# Patient Record
Sex: Male | Born: 1965
Health system: Southern US, Community
[De-identification: ages and names within clinical notes are randomized; demographics above are authoritative.]

## PROBLEM LIST (undated history)

## (undated) DIAGNOSIS — G35 Multiple sclerosis: Secondary | ICD-10-CM

## (undated) DIAGNOSIS — I10 Essential (primary) hypertension: Secondary | ICD-10-CM

## (undated) DIAGNOSIS — F419 Anxiety disorder, unspecified: Secondary | ICD-10-CM

## (undated) DIAGNOSIS — IMO0002 Reserved for concepts with insufficient information to code with codable children: Secondary | ICD-10-CM

## (undated) DIAGNOSIS — R51 Headache: Secondary | ICD-10-CM

## (undated) DIAGNOSIS — D131 Benign neoplasm of stomach: Secondary | ICD-10-CM

## (undated) DIAGNOSIS — E785 Hyperlipidemia, unspecified: Secondary | ICD-10-CM

## (undated) DIAGNOSIS — G709 Myoneural disorder, unspecified: Secondary | ICD-10-CM

## (undated) HISTORY — PX: RHINOPLASTY: SUR1284

## (undated) HISTORY — DX: Essential (primary) hypertension: I10

## (undated) HISTORY — DX: Multiple sclerosis: G35

## (undated) HISTORY — DX: Reserved for concepts with insufficient information to code with codable children: IMO0002

## (undated) HISTORY — DX: Anxiety disorder, unspecified: F41.9

## (undated) HISTORY — DX: Hyperlipidemia, unspecified: E78.5

## (undated) HISTORY — PX: COLONOSCOPY: SHX174

## (undated) HISTORY — DX: Benign neoplasm of stomach: D13.1

## (undated) HISTORY — DX: Headache: R51

## (undated) HISTORY — DX: Myoneural disorder, unspecified: G70.9

---

## 2005-06-14 ENCOUNTER — Emergency Department (HOSPITAL_COMMUNITY): Admission: EM | Admit: 2005-06-14 | Discharge: 2005-06-14 | Payer: Self-pay | Admitting: Emergency Medicine

## 2006-05-29 ENCOUNTER — Emergency Department (HOSPITAL_COMMUNITY): Admission: EM | Admit: 2006-05-29 | Discharge: 2006-05-29 | Payer: Self-pay | Admitting: Emergency Medicine

## 2007-01-10 ENCOUNTER — Emergency Department (HOSPITAL_COMMUNITY): Admission: EM | Admit: 2007-01-10 | Discharge: 2007-01-10 | Payer: Self-pay | Admitting: *Deleted

## 2009-03-28 ENCOUNTER — Ambulatory Visit: Payer: Self-pay | Admitting: Internal Medicine

## 2009-03-28 DIAGNOSIS — R519 Headache, unspecified: Secondary | ICD-10-CM | POA: Insufficient documentation

## 2009-03-28 DIAGNOSIS — G35 Multiple sclerosis: Secondary | ICD-10-CM | POA: Insufficient documentation

## 2009-03-28 DIAGNOSIS — K270 Acute peptic ulcer, site unspecified, with hemorrhage: Secondary | ICD-10-CM | POA: Insufficient documentation

## 2009-03-28 DIAGNOSIS — R51 Headache: Secondary | ICD-10-CM | POA: Insufficient documentation

## 2009-03-28 LAB — CONVERTED CEMR LAB
Basophils Absolute: 0 10*3/uL (ref 0.0–0.1)
Basophils Relative: 0.3 % (ref 0.0–3.0)
Eosinophils Absolute: 0.2 10*3/uL (ref 0.0–0.7)
Eosinophils Relative: 2.4 % (ref 0.0–5.0)
H Pylori IgG: NEGATIVE
HCT: 50.1 % (ref 39.0–52.0)
Hemoglobin: 16.3 g/dL (ref 13.0–17.0)
Iron: 61 ug/dL (ref 42–165)
Lymphocytes Relative: 38.1 % (ref 12.0–46.0)
Lymphs Abs: 2.5 10*3/uL (ref 0.7–4.0)
MCHC: 32.5 g/dL (ref 30.0–36.0)
MCV: 95.9 fL (ref 78.0–100.0)
Monocytes Absolute: 0.5 10*3/uL (ref 0.1–1.0)
Monocytes Relative: 7.9 % (ref 3.0–12.0)
Neutro Abs: 3.4 10*3/uL (ref 1.4–7.7)
Neutrophils Relative %: 51.3 % (ref 43.0–77.0)
Platelets: 134 10*3/uL — ABNORMAL LOW (ref 150.0–400.0)
RBC: 5.23 M/uL (ref 4.22–5.81)
RDW: 11.4 % — ABNORMAL LOW (ref 11.5–14.6)
Saturation Ratios: 20 % (ref 20.0–50.0)
Transferrin: 217.7 mg/dL (ref 212.0–360.0)
WBC: 6.6 10*3/uL (ref 4.5–10.5)

## 2009-04-03 ENCOUNTER — Telehealth: Payer: Self-pay | Admitting: *Deleted

## 2009-04-30 ENCOUNTER — Ambulatory Visit: Payer: Self-pay | Admitting: Internal Medicine

## 2009-04-30 DIAGNOSIS — J069 Acute upper respiratory infection, unspecified: Secondary | ICD-10-CM | POA: Insufficient documentation

## 2009-05-15 ENCOUNTER — Ambulatory Visit: Payer: Self-pay | Admitting: Internal Medicine

## 2009-05-27 ENCOUNTER — Encounter: Payer: Self-pay | Admitting: Internal Medicine

## 2009-07-16 ENCOUNTER — Ambulatory Visit: Payer: Self-pay | Admitting: Family Medicine

## 2009-07-16 DIAGNOSIS — M62838 Other muscle spasm: Secondary | ICD-10-CM | POA: Insufficient documentation

## 2009-07-16 DIAGNOSIS — M5430 Sciatica, unspecified side: Secondary | ICD-10-CM | POA: Insufficient documentation

## 2009-07-16 DIAGNOSIS — M5416 Radiculopathy, lumbar region: Secondary | ICD-10-CM | POA: Insufficient documentation

## 2009-07-23 ENCOUNTER — Telehealth: Payer: Self-pay | Admitting: Internal Medicine

## 2009-07-26 ENCOUNTER — Ambulatory Visit: Payer: Self-pay | Admitting: Internal Medicine

## 2009-08-01 ENCOUNTER — Encounter: Admission: RE | Admit: 2009-08-01 | Discharge: 2009-08-01 | Payer: Self-pay | Admitting: Internal Medicine

## 2009-09-02 ENCOUNTER — Ambulatory Visit: Payer: Self-pay | Admitting: Internal Medicine

## 2009-09-02 LAB — CONVERTED CEMR LAB
ALT: 18 units/L (ref 0–53)
AST: 26 units/L (ref 0–37)
Albumin: 4.3 g/dL (ref 3.5–5.2)
Alkaline Phosphatase: 68 units/L (ref 39–117)
BUN: 18 mg/dL (ref 6–23)
Basophils Absolute: 0 10*3/uL (ref 0.0–0.1)
Basophils Relative: 0.9 % (ref 0.0–3.0)
Bilirubin Urine: NEGATIVE
Bilirubin, Direct: 0.1 mg/dL (ref 0.0–0.3)
Blood in Urine, dipstick: NEGATIVE
CO2: 29 meq/L (ref 19–32)
Calcium: 9.2 mg/dL (ref 8.4–10.5)
Chloride: 108 meq/L (ref 96–112)
Cholesterol: 241 mg/dL — ABNORMAL HIGH (ref 0–200)
Creatinine, Ser: 0.9 mg/dL (ref 0.4–1.5)
Direct LDL: 164.7 mg/dL
Eosinophils Absolute: 0.2 10*3/uL (ref 0.0–0.7)
Eosinophils Relative: 4.6 % (ref 0.0–5.0)
GFR calc non Af Amer: 93.3 mL/min (ref >60)
Glucose, Bld: 86 mg/dL (ref 70–99)
Glucose, Urine, Semiquant: NEGATIVE
HCT: 45.8 % (ref 39.0–52.0)
HDL: 62.8 mg/dL (ref >39.00)
Hemoglobin: 15.8 g/dL (ref 13.0–17.0)
Ketones, urine, test strip: NEGATIVE
Lymphocytes Relative: 38 % (ref 12.0–46.0)
Lymphs Abs: 1.9 10*3/uL (ref 0.7–4.0)
MCHC: 34.6 g/dL (ref 30.0–36.0)
MCV: 93.8 fL (ref 78.0–100.0)
Monocytes Absolute: 0.5 10*3/uL (ref 0.1–1.0)
Monocytes Relative: 8.9 % (ref 3.0–12.0)
Neutro Abs: 2.4 10*3/uL (ref 1.4–7.7)
Neutrophils Relative %: 47.6 % (ref 43.0–77.0)
Nitrite: NEGATIVE
Platelets: 135 10*3/uL — ABNORMAL LOW (ref 150.0–400.0)
Potassium: 5.2 meq/L — ABNORMAL HIGH (ref 3.5–5.1)
Protein, U semiquant: NEGATIVE
RBC: 4.88 M/uL (ref 4.22–5.81)
RDW: 12.9 % (ref 11.5–14.6)
Sodium: 143 meq/L (ref 135–145)
Specific Gravity, Urine: 1.025
TSH: 1.43 microintl units/mL (ref 0.35–5.50)
Total Bilirubin: 0.9 mg/dL (ref 0.3–1.2)
Total CHOL/HDL Ratio: 4
Total Protein: 6.3 g/dL (ref 6.0–8.3)
Triglycerides: 66 mg/dL (ref 0.0–149.0)
Urobilinogen, UA: 0.2
VLDL: 13.2 mg/dL (ref 0.0–40.0)
WBC Urine, dipstick: NEGATIVE
WBC: 5.1 10*3/uL (ref 4.5–10.5)
pH: 5.5

## 2010-04-22 NOTE — Medication Information (Signed)
Summary: Coverage Approval for Dexilant  Coverage Approval for Dexilant   Imported By: Maryln Gottron 05/30/2009 11:27:05  _____________________________________________________________________  External Attachment:    Type:   Image     Comment:   External Document

## 2010-04-22 NOTE — Assessment & Plan Note (Signed)
Summary: chest congestion/cough/runny nose/cjr   Vital Signs:  Patient profile:   45 year old male Weight:      189 pounds Temp:     98.9 degrees F oral BP sitting:   120 / 72  (right arm) Cuff size:   regular  Vitals Entered By: Raechel Ache, RN (April 30, 2009 3:32 PM) CC: c/o nose congestion, cough , OTC not improving   CC:  c/o nose congestion, cough , and OTC not improving.  History of Present Illness: 45 year old patient with a one-week history of mild cough and sinus congestion.  there's been no fever, but he has noticed some increasing thickness and discoloration to the sputum production and sinus drainage.  Denies any shortness or breath or chest congestion.  He has been using the number of OTC medications, including expectant stick ingestants and antihistamines. he denies any local sinus pain or dental pain. He has been on PPI therapy for suspected exacerbation of peptic ulcer disease.  His symptoms have largely resolved  Allergies: No Known Drug Allergies  Past History:  Past Medical History: Reviewed history from 03/28/2009 and no changes required. Headache hx of ulcers MS  diagnosed 10 years ago ( [facility]- neurology) ( MRI and protein in spinal fluid and acute paralysis) ( MS crisis) 2000  Review of Systems       The patient complains of prolonged cough.  The patient denies anorexia, fever, weight loss, weight gain, vision loss, decreased hearing, hoarseness, chest pain, syncope, dyspnea on exertion, peripheral edema, headaches, hemoptysis, abdominal pain, melena, hematochezia, severe indigestion/heartburn, hematuria, incontinence, genital sores, muscle weakness, suspicious skin lesions, transient blindness, difficulty walking, depression, unusual weight change, abnormal bleeding, enlarged lymph nodes, angioedema, breast masses, and testicular masses.    Physical Exam  General:  Well-developed,well-nourished,in no acute distress; alert,appropriate and cooperative  throughout examination Head:  Normocephalic and atraumatic without obvious abnormalities. No apparent alopecia or balding. Eyes:  No corneal or conjunctival inflammation noted. EOMI. Perrla. Funduscopic exam benign, without hemorrhages, exudates or papilledema. Vision grossly normal. Ears:  External ear exam shows no significant lesions or deformities.  Otoscopic examination reveals clear canals, tympanic membranes are intact bilaterally without bulging, retraction, inflammation or discharge. Hearing is grossly normal bilaterally. Mouth:  Oral mucosa and oropharynx without lesions or exudates.  Teeth in good repair. Neck:  No deformities, masses, or tenderness noted. Lungs:  Normal respiratory effort, chest expands symmetrically. Lungs are clear to auscultation, no crackles or wheezes. Heart:  Normal rate and regular rhythm. S1 and S2 normal without gallop, murmur, click, rub or other extra sounds. Abdomen:  Bowel sounds positive,abdomen soft and non-tender without masses, organomegaly or hernias noted.   Impression & Recommendations:  Problem # 1:  URI (ICD-465.9)  Problem # 2:  ACUT PEPTC ULCR UNS SITE W/HEM W/O MENTION OBST (ICD-533.00)  His updated medication list for this problem includes:    Dexilant 60 Mg Cpdr (Dexlansoprazole) ..... One by mouth two times a day  His updated medication list for this problem includes:    Dexilant 60 Mg Cpdr (Dexlansoprazole) ..... One by mouth two times a day  Complete Medication List: 1)  Copaxone 20 Mg/ml Kit (Glatiramer acetate) .[name].. 1-2 times a week 2)  Dexilant 60 Mg Cpdr (Dexlansoprazole) .... One by mouth two times a day  Patient Instructions: 1)  Get plenty of rest, drink lots of clear liquids, and use Tylenol or Ibuprofen for fever and comfort. Return in 7-10 days if you're not better:sooner if you're  feeling worse.

## 2010-04-22 NOTE — Assessment & Plan Note (Signed)
Summary: TO BE EST/OK PER BONNIE/NJR   Vital Signs:  Patient profile:   45 year old male Height:      70 inches Weight:      186 pounds BMI:     26.78 Temp:     98.2 degrees F oral Pulse rate:   72 / minute Resp:     14 per minute BP sitting:   110 / 80  (left arm)  Vitals Entered By: Willy Eddy, LPN (March 28, 2009 2:26 PM) CC: new pt to establish- c/o abd cramping with irregaular bowel movements- hx of ulcer in the past and he states it feels the same, Abdominal Pain   CC:  new pt to establish- c/o abd cramping with irregaular bowel movements- hx of ulcer in the past and he states it feels the same and Abdominal Pain.  History of Present Illness: increased with eating certain food and post pradial bloating dark stools variable consistancy hx of "ulcer" but no egd hx of MS no excessive sas use potential increased wine and greasy foods increasd stress and anxiety I have spent greater that 45 min face to face evaluating this patient and over 1/2 of this time was in councilling   Dyspepsia History:      The patient has positive alarm features of dyspepsia which include melena.  There is no prior history of GERD.  The patient does not have a prior history of documented ulcer disease.  The dominant symptom is heartburn or acid reflux.  An H-2 blocker medication is currently being taken.  He notes that the symptoms have not improved with the H-2 blocker therapy.  No previous upper endoscopy has been done.    Preventive Screening-Counseling & Management  Alcohol-Tobacco     Smoking Status: quit  Caffeine-Diet-Exercise     Caffeine use/day: yes 2-3 cups     Does Patient Exercise: yes      Drug Use:  no.    Problems Prior to Update: 1)  Family History of Alcoholism/addiction  (ICD-V61.41) 2)  Multiple Sclerosis  (ICD-340) 3)  Headache  (ICD-784.0)  Medications Prior to Update: 1)  None  Current Medications (verified): 1)  Copaxone 20 Mg/ml Kit (Glatiramer  Acetate) .[name].. 1-2 Times A Week  Allergies (verified): No Known Drug Allergies  Past History:  Family History: Last updated: 03/28/2009 Family History of Alcoholism/Addiction Family History of Arthritis  Social History: Last updated: 03/28/2009 Occupation:investment banker Married Alcohol use-yes Drug use-no Regular exercise-yes Former Smoker quit 1.2 years  Risk Factors: Caffeine Use: yes 2-3 cups (03/28/2009) Exercise: yes (03/28/2009)  Risk Factors: Smoking Status: quit (03/28/2009)  Past medical, surgical, family and social histories (including risk factors) reviewed, and no changes noted (except as noted below).  Past Medical History: Headache hx of ulcers MS  diagnosed 10 years ago ( [facility]- [facility]y) ( MRI and protein in spinal fluid and acute paralysis) ( MS crisis) 2000  Past Surgical History: rhinoplasty of noise when younger  Family History: Reviewed history and no changes required. Family History of Alcoholism/Addiction Family History of Arthritis  Social History: Reviewed history and no changes required. Occupation:investment banker Married Alcohol use-yes Drug use-no Regular exercise-yes Former Smoker quit 1.2 years Smoking Status:  quit Drug Use:  no Does Patient Exercise:  yes Caffeine use/day:  yes 2-3 cups  Review of Systems       The patient complains of abdominal pain and melena.  The patient denies anorexia, fever, weight loss, weight gain, vision loss, decreased  hearing, hoarseness, chest pain, syncope, dyspnea on exertion, peripheral edema, prolonged cough, headaches, hemoptysis, hematochezia, severe indigestion/heartburn, hematuria, incontinence, genital sores, muscle weakness, suspicious skin lesions, transient blindness, difficulty walking, depression, unusual weight change, abnormal bleeding, enlarged lymph nodes, angioedema, breast masses, and testicular masses.    Physical Exam  General:  Well-developed,well-nourished,in no  acute distress; alert,appropriate and cooperative throughout examination Head:  normocephalic, atraumatic, and male-pattern balding.   Eyes:  pupils equal and pupils round.  red conjuctava Ears:  External ear exam shows no significant lesions or deformities.  Otoscopic examination reveals clear canals, tympanic membranes are intact bilaterally without bulging, retraction, inflammation or discharge. Hearing is grossly normal bilaterally. Nose:  External nasal examination shows no deformity or inflammation. Nasal mucosa are pink and moist without lesions or exudates. Mouth:  good dentition and pharynx pink and moist.   Neck:  No deformities, masses, or tenderness noted. Lungs:  Normal respiratory effort, chest expands symmetrically. Lungs are clear to auscultation, no crackles or wheezes. Heart:  normal rate and regular rhythm.   Abdomen:  soft, no guarding, no rigidity, no rebound tenderness, and epigastric tenderness.   Rectal:  no external abnormalities and no hemorrhoids.   stool heme Prostate:  no gland enlargement, no nodules, and no asymmetry.     Impression & Recommendations:  Problem # 1:  ACUT PEPTC ULCR UNS SITE W/HEM W/O MENTION OBST (ICD-533.00)  no worrisome signs for continues bleeding and stool is now heme negative check labs  His updated medication list for this problem includes:    Dexilant 60 Mg Cpdr (Dexlansoprazole) ..... One by mouth two times a day  Orders: Venipuncture (16109) TLB-CBC Platelet - w/Differential (85025-CBCD) TLB-IBC Pnl (Iron/FE;Transferrin) (83550-IBC) TLB-H. Pylori Abs(Helicobacter Pylori) (86677-HELICO)  Problem # 2:  MULTIPLE SCLEROSIS (ICD-340) stable on meds reveiwed diagnosis and hx with pt  Complete Medication List: 1)  Copaxone 20 Mg/ml Kit (Glatiramer acetate) .[name].. 1-2 times a week 2)  Dexilant 60 Mg Cpdr (Dexlansoprazole) .... One by mouth two times a day  Patient Instructions: 1)  Please schedule a follow-up appointment in 4-5  weeks.  Contraindications/Deferment of Procedures/Staging:    Test/Procedure: FLU VAX    Reason for deferment: patient declined

## 2010-04-22 NOTE — Progress Notes (Signed)
Summary: back pain not getting better as before  Phone Note Call from Patient Call back at Home Phone [phone]   Reason for Call: Insurance Question Summary of Call: Dr. Scotty Court ov last week for acute low back pain and sometimes pain into both legs.  Gave steroids, gen flexeril, motrin.  Had been taking all three times a day and yesterday due to cut steroid to two times a day and also cut flexeril & motrin to two times a day.  By late pm acute low back pain again & felt like bones grinding together.  Couldn't walk on his own.  Wakes fine & walking gingerly, then at work sitting long day at desk becomes awful.  Ice & heat alternately.  Really not responding this time as well as before.  RA [employer].  NKDA.   Initial call taken by: Rudy Jew, RN,  Jul 23, 2009 8:13 AM  Follow-up for Phone Call        pt offered ov at 4:15 but pt declined Follow-up by: Willy Eddy, LPN,  Jul 23, 9560 10:37 AM  Additional Follow-up for Phone Call Additional follow up Details #1::        ov given for friday at 9;15 Additional Follow-up by: Willy Eddy, LPN,  Jul 24, 1306 12:28 PM

## 2010-04-22 NOTE — Assessment & Plan Note (Signed)
Summary: low back pain/dm   Vital Signs:  Patient profile:   45 year old male Weight:      190 pounds O2 Sat:      98 % Temp:     98.2 degrees F Pulse rate:   80 / minute BP sitting:   118 / 76  (left arm)  Vitals Entered By: Pura Spice, RN (July 16, 2009 3:18 PM) CC: low baack pain with pain radiating down left leg to the knee area.    History of Present Illness: This 45 year old white male with known multiple sclerosis under control. This complaint today is that of low back pain with radiation from the back to the left knee he has had back pain in the past with muscle spasm but not as severe as today. He had onset over the last few days and his symptoms and pain heart getting more severe. No recent history of injury or heavy lift. Alternative GI symptoms have been under control as well as his old sclerosis  Allergies (verified): No Known Drug Allergies  Past History:  Past Medical History: Last updated: 03/28/2009 Headache hx of ulcers MS  diagnosed 10 years ago ( [facility]- [facility]y) ( MRI and protein in spinal fluid and acute paralysis) ( MS crisis) 2000  Past Surgical History: Last updated: 03/28/2009 rhinoplasty of noise when younger  Social History: Last updated: 03/28/2009 Occupation:investment banker Married Alcohol use-yes Drug use-no Regular exercise-yes Former Smoker quit 1.2 years  Risk Factors: Smoking Status: quit (05/15/2009)  Review of Systems      See HPI  The patient denies anorexia, fever, weight loss, weight gain, vision loss, decreased hearing, hoarseness, chest pain, syncope, dyspnea on exertion, peripheral edema, prolonged cough, headaches, hemoptysis, abdominal pain, melena, hematochezia, severe indigestion/heartburn, hematuria, incontinence, genital sores, muscle weakness, suspicious skin lesions, transient blindness, difficulty walking, depression, unusual weight change, abnormal bleeding, enlarged lymph nodes, angioedema, breast masses,  and testicular masses.    Physical Exam  General:  Well-developed,well-nourished,in no acute distress; alert,appropriate and cooperative throughout examination Lungs:  Normal respiratory effort, chest expands symmetrically. Lungs are clear to auscultation, no crackles or wheezes. Heart:  Normal rate and regular rhythm. S1 and S2 normal without gallop, murmur, click, rub or other extra sounds. Abdomen:  Bowel sounds positive,abdomen soft and non-tender without masses, organomegaly or hernias noted. Msk:  muscle spasm lumbar spine as well as tenderness over the left sacroiliac joint No limitation straight leg raise and reflexes are 2+ bilaterally knee and ankle Extremities:  No clubbing, cyanosis, edema, or deformity noted with normal full range of motion of all joints.   Neurologic:  No cranial nerve deficits noted. Station and gait are normal. Plantar reflexes are down-going bilaterally. DTRs are symmetrical throughout. Sensory, motor and coordinative functions appear intact. Skin:  Intact without suspicious lesions or rashes Psych:  Cognition and judgment appear intact. Alert and cooperative with normal attention span and concentration. No apparent delusions, illusions, hallucinations   Impression & Recommendations:  Problem # 1:  MUSCLE SPASM, LUMBOSACRAL REGION (ICD-728.85) Assessment New Flexeril 10 mg t.i.d. Prednisone 20 mg decreased dose  Problem # 2:  SCIATICA (ICD-724.3) Assessment: New  His updated medication list for this problem includes:    Ibuprofen 800 Mg Tabs (Ibuprofen) .[name]... 1 three times a day as needed pain    Flexeril 10 Mg Tabs (Cyclobenzaprine hcl) .[name]... 1 stat then 1 morn, midaternoon and hs for muscle spasm  Problem # 3:  LUMBOSACRAL STRAIN, WITH RADICULOPATHY (ICD-846.0) Assessment: New  Complete Medication List: 1)  Copaxone 20 Mg/ml Kit (Glatiramer acetate) .[name].. 1-2 times a week 2)  Dexilant 60 Mg Cpdr (Dexlansoprazole) .... One by mouth daily 3)   Ibuprofen 800 Mg Tabs (Ibuprofen) .[name].. 1 three times a day as needed pain 4)  Prednisone 20 Mg Tabs (Prednisone) .[name].. 1 three times a day pc for 3 days then 1 two times a day pc for 6 days then 1 qd 5)  Flexeril 10 Mg Tabs (Cyclobenzaprine hcl) .[name].. 1 stat then 1 morn, midaternoon and hs for muscle spasm  Patient Instructions: 1)  Diagnosos of Lumbosacral stain, involving Sciatic joint and nerve with sciatica  2)  Tx Prednisone decreasing  dosage, antiinflammatory 3)  Flexeril gemneric 1  tab now then 1 morn, midafternoon and  4)  bedtime 4 muscle spasm 5)  TAKE  MOTRIN  800 MG  AS NEEDED FOR PAIN, EVERY 4-6 HOURS Prescriptions: FLEXERIL 10 MG TABS (CYCLOBENZAPRINE HCL) 1 stat then 1 morn, midaternoon and hs for muscle spasm  #60 x 2   Entered and Authorized by:   Judithann Sheen MD   Signed by:   Judithann Sheen MD on 07/16/2009   Method used:   Electronically to        [facility]. [phone]* (retail)       [address]       [place], [place]  [address]       Ph: [phone]       Fax: [phone]   RxID:   [id number] PREDNISONE 20 MG TABS (PREDNISONE) 1 three times a day pc for 3 days then 1 two times a day pc for 6 days then 1 qd  #30 x 1   Entered and Authorized by:   Judithann Sheen MD   Signed by:   Judithann Sheen MD on 07/16/2009   Method used:   Electronically to        [facility]. [phone]* (retail)       [address]       [place], [place]  [address]       Ph: [phone]       Fax: [phone]   RxID:   [id number] IBUPROFEN 800 MG TABS (IBUPROFEN) 1 three times a day as needed pain  #90 x 11   Entered and Authorized by:   Judithann Sheen MD   Signed by:   Judithann Sheen MD on 07/16/2009   Method used:   Electronically to        [facility]. [phone]* (retail)       [address]       [place], [place]  [address]       Ph:  [phone]       Fax: [phone]   RxID:   [id number]

## 2010-04-22 NOTE — Assessment & Plan Note (Signed)
Summary: FOLLOW UP //SLM   Vital Signs:  Patient profile:   45 year old male Height:      70 inches Weight:      188 pounds BMI:     27.07 Temp:     98.3 degrees F oral Pulse rate:   72 / minute Resp:     14 per minute BP sitting:   110 / 70  (left arm)  Vitals Entered By: Willy Eddy, LPN (May 15, 2009 10:22 AM)  Nutrition Counseling: Patient's BMI is greater than 25 and therefore counseled on weight management options. CC: roa from  abd pain-muich improved since taking dexilent-was given samples of prilosec by dr Kirtland Bouchard that hasnt worked as well, Abdominal Pain   CC:  roa from  abd pain-muich improved since taking dexilent-was given samples of prilosec by dr Kirtland Bouchard that hasnt worked as well and Abdominal Pain.  History of Present Illness: follow up presumptive diagnosis of PUD  Dyspepsia History:      The patient has positive alarm features of dyspepsia which include melena.  There is no prior history of GERD.  The patient has a prior history of documented ulcer disease.  The dominant symptom is heartburn or acid reflux.  An H-2 blocker medication is not currently being taken.  He has a history of a positive H. Pylori serology.  No previous upper endoscopy has been done.    Preventive Screening-Counseling & Management  Alcohol-Tobacco     Smoking Status: quit  Problems Prior to Update: 1)  Uri  (ICD-465.9) 2)  Acut Peptc Ulcr Uns Site W/hem w/o Mention Obst  (ICD-533.00) 3)  Family History of Alcoholism/addiction  (ICD-V61.41) 4)  Multiple Sclerosis  (ICD-340) 5)  Headache  (ICD-784.0)  Medications Prior to Update: 1)  Copaxone 20 Mg/ml Kit (Glatiramer Acetate) .[name].. 1-2 Times A Week 2)  Dexilant 60 Mg Cpdr (Dexlansoprazole) .... One By Mouth Two Times A Day  Current Medications (verified): 1)  Copaxone 20 Mg/ml Kit (Glatiramer Acetate) .[name].. 1-2 Times A Week 2)  Dexilant 60 Mg Cpdr (Dexlansoprazole) .... One By Mouth Daily  Allergies (verified): No Known Drug  Allergies  Past History:  Family History: Last updated: 03/28/2009 Family History of Alcoholism/Addiction Family History of Arthritis  Social History: Last updated: 03/28/2009 Occupation:investment banker Married Alcohol use-yes Drug use-no Regular exercise-yes Former Smoker quit 1.2 years  Risk Factors: Caffeine Use: yes 2-3 cups (03/28/2009) Exercise: yes (03/28/2009)  Risk Factors: Smoking Status: quit (05/15/2009)  Past medical, surgical, family and social histories (including risk factors) reviewed, and no changes noted (except as noted below).  Past Medical History: Reviewed history from 03/28/2009 and no changes required. Headache hx of ulcers MS  diagnosed 10 years ago ( [facility]- [facility]y) ( MRI and protein in spinal fluid and acute paralysis) ( MS crisis) 2000  Past Surgical History: Reviewed history from 03/28/2009 and no changes required. rhinoplasty of noise when younger  Family History: Reviewed history from 03/28/2009 and no changes required. Family History of Alcoholism/Addiction Family History of Arthritis  Social History: Reviewed history from 03/28/2009 and no changes required. Occupation:investment banker Married Alcohol use-yes Drug use-no Regular exercise-yes Former Smoker quit 1.2 years  Review of Systems  The patient denies anorexia, fever, weight loss, weight gain, vision loss, decreased hearing, hoarseness, chest pain, syncope, dyspnea on exertion, peripheral edema, prolonged cough, headaches, hemoptysis, abdominal pain, melena, hematochezia, severe indigestion/heartburn, hematuria, incontinence, genital sores, muscle weakness, suspicious skin lesions, transient blindness, difficulty walking, depression, unusual weight change, abnormal  bleeding, enlarged lymph nodes, angioedema, and breast masses.    Physical Exam  General:  Well-developed,well-nourished,in no acute distress; alert,appropriate and cooperative throughout  examination Eyes:  No corneal or conjunctival inflammation noted. EOMI. Perrla. Funduscopic exam benign, without hemorrhages, exudates or papilledema. Vision grossly normal. Ears:  External ear exam shows no significant lesions or deformities.  Otoscopic examination reveals clear canals, tympanic membranes are intact bilaterally without bulging, retraction, inflammation or discharge. Hearing is grossly normal bilaterally. Neck:  No deformities, masses, or tenderness noted. Lungs:  Normal respiratory effort, chest expands symmetrically. Lungs are clear to auscultation, no crackles or wheezes. Heart:  Normal rate and regular rhythm. S1 and S2 normal without gallop, murmur, click, rub or other extra sounds. Abdomen:  Bowel sounds positive,abdomen soft and non-tender without masses, organomegaly or hernias noted. Msk:  No deformity or scoliosis noted of thoracic or lumbar spine.   Pulses:  R and L carotid,radial,femoral,dorsalis pedis and posterior tibial pulses are full and equal bilaterally Extremities:  No clubbing, cyanosis, edema, or deformity noted with normal full range of motion of all joints.   Neurologic:  No cranial nerve deficits noted. Station and gait are normal. Plantar reflexes are down-going bilaterally. DTRs are symmetrical throughout. Sensory, motor and coordinative functions appear intact.   Impression & Recommendations:  Problem # 1:  ACUT PEPTC ULCR UNS SITE W/HEM W/O MENTION OBST (ICD-533.00) Assessment Improved completed course of dexilant and switched to prilosec and the stomach issues had break through pain His updated medication list for this problem includes:    Dexilant 60 Mg Cpdr (Dexlansoprazole) ..... One by mouth daily  Labs Reviewed: Hgb: 16.3 (03/28/2009)   Hct: 50.1 (03/28/2009)    avoid NSAID is possible  Problem # 2:  HEADACHE (ICD-784.0) moderate the use of Motirn Headache diary reviewed. maxalt for migraines has imporved with quiting  smoking  Complete Medication List: 1)  Copaxone 20 Mg/ml Kit (Glatiramer acetate) .[name].. 1-2 times a week 2)  Dexilant 60 Mg Cpdr (Dexlansoprazole) .... One by mouth daily  Dyspepsia Assessment/Plan:  Step Therapy: GERD Treatment Protocols:    Step-1: failed    Step-2: failed    Step-3: started    PPI chosen: Prevacid 30mg  by mouth once a day x 8 weeks  Patient Instructions: 1)  CPX for  4-6 months Prescriptions: DEXILANT 60 MG CPDR (DEXLANSOPRAZOLE) one by mouth daily  #30 x 11   Entered and Authorized by:   Stacie Glaze MD   Signed by:   Stacie Glaze MD on 05/15/2009   Method used:   Electronically to        [facility]. [phone]* (retail)       [address]       [place], [place]  [address]       Ph: [phone]       Fax: [phone]   RxID:   [id number]   Appended Document: Orders Update    Clinical Lists Changes  Orders: Added new Service order of Est. Patient Level IV (44010) - Signed

## 2010-04-22 NOTE — Progress Notes (Signed)
Summary: lab results needed  Phone Note Call from Patient Call back at Home Phone [phone]   Caller: Patient-live call Reason for Call: Acute Illness, Talk to Nurse, Lab or Test Results Summary of Call: requesting lab results.  Follow-up for Phone Call        pt informed Follow-up by: Willy Eddy, LPN,  April 03, 2009 2:01 PM

## 2010-04-22 NOTE — Assessment & Plan Note (Signed)
Summary: back pain/bmw   Vital Signs:  Patient profile:   45 year old male Height:      70 inches Weight:      194 pounds BMI:     27.94 Temp:     98.2 degrees F oral Pulse rate:   76 / minute Resp:     14 per minute BP sitting:   120 / 80  (left arm)  Vitals Entered By: Willy Eddy, LPN (Jul 26, 1476 9:22 AM) CC: c/o low back pain-saw dr Scotty Court was rx with flexeril and prednisone dose pack which helped while taking, but now has completed and pain has returned   CC:  c/o low back pain-saw dr Scotty Court was rx with flexeril and prednisone dose pack which helped while taking and but now has completed and pain has returned.  History of Present Illness: The pain in the lower back with occasional spasm in the past the pain ahas occured of and on for 2 years and the acute pain has lasted for over two weeks saw dr Scotty Court and was given pain meds and muscle relaxants and this did not help ( failed NSAIDs and relaxnats and rest) had an acute event with severe pain and had to be helped to car now stand with leaning forward causes the most pain and carrying a suitcase  Preventive Screening-Counseling & Management  Alcohol-Tobacco     Smoking Status: quit  Problems Prior to Update: 1)  Muscle Spasm, Lumbosacral Region  (ICD-728.85) 2)  Sciatica  (ICD-724.3) 3)  Lumbosacral Strain, With Radiculopathy  (ICD-846.0) 4)  Uri  (ICD-465.9) 5)  Acut Peptc Ulcr Uns Site W/hem w/o Mention Obst  (ICD-533.00) 6)  Family History of Alcoholism/addiction  (ICD-V61.41) 7)  Multiple Sclerosis  (ICD-340) 8)  Headache  (ICD-784.0)  Current Problems (verified): 1)  Muscle Spasm, Lumbosacral Region  (ICD-728.85) 2)  Sciatica  (ICD-724.3) 3)  Lumbosacral Strain, With Radiculopathy  (ICD-846.0) 4)  Uri  (ICD-465.9) 5)  Acut Peptc Ulcr Uns Site W/hem w/o Mention Obst  (ICD-533.00) 6)  Family History of Alcoholism/addiction  (ICD-V61.41) 7)  Multiple Sclerosis  (ICD-340) 8)  Headache   (ICD-784.0)  Medications Prior to Update: 1)  Copaxone 20 Mg/ml Kit (Glatiramer Acetate) .[name].. 1-2 Times A Week 2)  Dexilant 60 Mg Cpdr (Dexlansoprazole) .... One By Mouth Daily 3)  Ibuprofen 800 Mg Tabs (Ibuprofen) .[name].. 1 Three Times A Day As Needed Pain 4)  Prednisone 20 Mg Tabs (Prednisone) .[name].. 1 Three Times A Day Pc For 3 Days Then 1 Two Times A Day Pc For 6 Days Then 1 Qd 5)  Flexeril 10 Mg Tabs (Cyclobenzaprine Hcl) .[name].. 1 Stat Then 1 Morn, Midaternoon and Hs For Muscle Spasm  Current Medications (verified): 1)  Copaxone 20 Mg/ml Kit (Glatiramer Acetate) .[name].. 1-2 Times A Week 2)  Dexilant 60 Mg Cpdr (Dexlansoprazole) .... One By Mouth Daily 3)  Ibuprofen 800 Mg Tabs (Ibuprofen) .[name].. 1 Three Times A Day As Needed Pain  Allergies (verified): No Known Drug Allergies  Past History:  Family History: Last updated: 03/28/2009 Family History of Alcoholism/Addiction Family History of Arthritis  Social History: Last updated: 03/28/2009 Occupation:investment banker Married Alcohol use-yes Drug use-no Regular exercise-yes Former Smoker quit 1.2 years  Risk Factors: Caffeine Use: yes 2-3 cups (03/28/2009) Exercise: yes (03/28/2009)  Risk Factors: Smoking Status: quit (07/26/2009)  Past medical, surgical, family and social histories (including risk factors) reviewed, and no changes noted (except as noted below).  Past Medical History: Reviewed  history from 03/28/2009 and no changes required. Headache hx of ulcers MS  diagnosed 10 years ago ( [facility]- neurology) ( MRI and protein in spinal fluid and acute paralysis) ( MS crisis) 2000  Past Surgical History: Reviewed history from 03/28/2009 and no changes required. rhinoplasty of noise when younger  Family History: Reviewed history from 03/28/2009 and no changes required. Family History of Alcoholism/Addiction Family History of Arthritis  Social History: Reviewed history from 03/28/2009 and no changes  required. Occupation:investment banker Married Alcohol use-yes Drug use-no Regular exercise-yes Former Smoker quit 1.2 years  Review of Systems  The patient denies anorexia, fever, weight loss, weight gain, vision loss, decreased hearing, hoarseness, chest pain, syncope, dyspnea on exertion, peripheral edema, prolonged cough, headaches, hemoptysis, abdominal pain, melena, hematochezia, severe indigestion/heartburn, hematuria, incontinence, genital sores, muscle weakness, suspicious skin lesions, transient blindness, difficulty walking, depression, unusual weight change, abnormal bleeding, enlarged lymph nodes, angioedema, and breast masses.    Physical Exam  General:  Well-developed,well-nourished,in no acute distress; alert,appropriate and cooperative throughout examination Head:  Normocephalic and atraumatic without obvious abnormalities. No apparent alopecia or balding. Eyes:  No corneal or conjunctival inflammation noted. EOMI. Perrla. Funduscopic exam benign, without hemorrhages, exudates or papilledema. Vision grossly normal. Neck:  No deformities, masses, or tenderness noted. Lungs:  Normal respiratory effort, chest expands symmetrically. Lungs are clear to auscultation, no crackles or wheezes. Heart:  Normal rate and regular rhythm. S1 and S2 normal without gallop, murmur, click, rub or other extra sounds. Abdomen:  soft, normal bowel sounds, and no distention.   Genitalia:  circumcised and uncircumcised.     Impression & Recommendations:  Problem # 1:  LUMBOSACRAL STRAIN, WITH RADICULOPATHY (ICD-846.0)  persistant back pain ( two year hx) acute "injury" with persistant pain despite two weeks of conservative therapy the pain radiates to the left left and stops a the knee the pain  is reproducible with flexion  Orders: Radiology Referral (Radiology) T-Lumbar Spine Comp w/[facility] [phone])  Problem # 2:  MULTIPLE SCLEROSIS (ICD-340) The pt has MS  but this is not like  any prior flair of note the pt was on steriod by dr Scotty Court  Problem # 3:  ACUT PEPTC ULCR UNS SITE W/HEM W/O MENTION OBST (ICD-533.00) the pt increased the dexilant to two times a day for te nsaid use His updated medication list for this problem includes:    Dexilant 60 Mg Cpdr (Dexlansoprazole) ..... One by mouth daily  Labs Reviewed: Hgb: 16.3 (03/28/2009)   Hct: 50.1 (03/28/2009)     Complete Medication List: 1)  Copaxone 20 Mg/ml Kit (Glatiramer acetate) .[name].. 1-2 times a week 2)  Dexilant 60 Mg Cpdr (Dexlansoprazole) .... One by mouth daily 3)  Ibuprofen 800 Mg Tabs (Ibuprofen) .[name].. 1 three times a day as needed pain 4)  Tramadol Hcl 50 Mg Tabs (Tramadol hcl) .... One by mouth q 4 hours prn  Patient Instructions: 1)  get plain films today and MRI early next week Prescriptions: TRAMADOL HCL 50 MG TABS (TRAMADOL HCL) one by mouth q 4 hours prn  #30 x 0   Entered and Authorized by:   Stacie Glaze MD   Signed by:   Stacie Glaze MD on 07/26/2009   Method used:   Electronically to        [id number] (retail)       [address]       [place], [place]  [address]       Ph: [phone]       Fax: [phone]   RxID:   [id number]

## 2010-08-02 ENCOUNTER — Other Ambulatory Visit: Payer: Self-pay | Admitting: Family Medicine

## 2010-08-02 ENCOUNTER — Other Ambulatory Visit: Payer: Self-pay | Admitting: Internal Medicine

## 2011-03-30 ENCOUNTER — Encounter: Payer: Self-pay | Admitting: Gastroenterology

## 2011-04-06 ENCOUNTER — Ambulatory Visit (INDEPENDENT_AMBULATORY_CARE_PROVIDER_SITE_OTHER): Payer: BC Managed Care – PPO | Admitting: Gastroenterology

## 2011-04-06 ENCOUNTER — Encounter: Payer: Self-pay | Admitting: Gastroenterology

## 2011-04-06 VITALS — BP 120/78 | HR 70 | Ht 70.0 in | Wt 198.4 lb

## 2011-04-06 DIAGNOSIS — R197 Diarrhea, unspecified: Secondary | ICD-10-CM

## 2011-04-06 DIAGNOSIS — K219 Gastro-esophageal reflux disease without esophagitis: Secondary | ICD-10-CM

## 2011-04-06 MED ORDER — DEXLANSOPRAZOLE 60 MG PO CPDR: 60.0000 mg | DELAYED_RELEASE_CAPSULE | Freq: Every day | ORAL | Status: DC

## 2011-04-06 MED ORDER — PEG-KCL-NACL-NASULF-NA ASC-C 100 G PO SOLR: 1.0000 | Freq: Once | ORAL | Status: DC

## 2011-04-06 MED ORDER — HYOSCYAMINE SULFATE 0.125 MG SL SUBL: SUBLINGUAL_TABLET | SUBLINGUAL | Status: DC

## 2011-04-06 NOTE — Progress Notes (Signed)
History of Present Illness: This is a 46 year old male with a history of a duodenal ulcer diagnosed in his teens. He has had intermittent difficulties with epigastric pain and reflux symptoms for many years. Most recently his symptoms have been well controlled on Dexilant. He is currently taking omeprazole without adequate symptom control. Over the past few years he has had worsening problems with significant bloating and urgent, postprandial diarrhea occurring with every meal he consumes outside his home. It occasionally happens with meals at his home. He states that he takes ibuprofen about once each week for tension headaches. Denies weight loss, constipation, change in stool caliber, melena, hematochezia, nausea, vomiting, dysphagia, chest pain.  Review of Systems: Pertinent positive and negative review of systems were noted in the above HPI section. All other review of systems were otherwise negative.  Current Medications, Allergies, Past Medical History, Past Surgical History, Family History and Social History were reviewed in [facility] record.  Physical Exam: General: Well developed , well nourished, no acute distress Head: Normocephalic and atraumatic Eyes:  sclerae anicteric, EOMI Ears: Normal auditory acuity Mouth: No deformity or lesions Neck: Supple, no masses or thyromegaly Lungs: Clear throughout to auscultation Heart: Regular rate and rhythm; no murmurs, rubs or bruits Abdomen: Soft, non tender and non distended. No masses, hepatosplenomegaly or hernias noted. Normal Bowel sounds Rectal: Deferred to colonoscopy Musculoskeletal: Symmetrical with no gross deformities  Skin: No lesions on visible extremities Pulses:  Normal pulses noted Extremities: No clubbing, cyanosis, edema or deformities noted Neurological: Alert oriented x 4, grossly nonfocal Cervical Nodes:  No significant cervical adenopathy Inguinal Nodes: No significant inguinal  adenopathy Psychological:  Alert and cooperative. Normal mood and affect  Assessment and Recommendations:  1. Presumed GERD. Rule out ulcer disease, Barrett's esophagus and erosive esophagitis. Begin Dexilant 60 mg daily and discontinue omeprazole. Begin on standard antireflux measures. Schedule upper endoscopy. The risks, benefits, and alternatives to endoscopy with possible biopsy and possible dilation were discussed with the patient and they consent to proceed.   2. Urgent postprandial diarrhea and bloating. Probable irritable bowel syndrome. Rule out inflammatory bowel disease and colorectal neoplasms. Trial of Levsin before meals and every 4 hours as needed. Schedule colonoscopy. The risks, benefits, and alternatives to colonoscopy with possible biopsy and possible polypectomy were discussed with the patient and they consent to proceed.

## 2011-04-06 NOTE — Patient Instructions (Addendum)
You have been scheduled for a Upper Endoscopy/ Colonoscopy with propofol. See separate instructions. Pick up your prep kit from your pharmacy.  A prescription for Levsin and Dexilant has been sent to your pharmacy. Patient advised to avoid spicy, acidic, citrus, chocolate, mints, fruit and fruit juices.  Limit the intake of caffeine, alcohol and Soda.  Don't exercise too soon after eating.  Don't lie down within 3-4 hours of eating.  Elevate the head of your bed.

## 2011-04-27 ENCOUNTER — Encounter: Payer: Self-pay | Admitting: Gastroenterology

## 2011-04-27 ENCOUNTER — Ambulatory Visit (AMBULATORY_SURGERY_CENTER): Payer: BC Managed Care – PPO | Admitting: Gastroenterology

## 2011-04-27 VITALS — BP 127/86 | HR 81 | Temp 97.3°F | Resp 18 | Ht 70.0 in | Wt 198.0 lb

## 2011-04-27 DIAGNOSIS — D131 Benign neoplasm of stomach: Secondary | ICD-10-CM

## 2011-04-27 DIAGNOSIS — K219 Gastro-esophageal reflux disease without esophagitis: Secondary | ICD-10-CM

## 2011-04-27 DIAGNOSIS — R197 Diarrhea, unspecified: Secondary | ICD-10-CM

## 2011-04-27 DIAGNOSIS — D126 Benign neoplasm of colon, unspecified: Secondary | ICD-10-CM

## 2011-04-27 MED ORDER — SODIUM CHLORIDE 0.9 % IV SOLN: 500.0000 mL | INTRAVENOUS | Status: DC

## 2011-04-27 NOTE — Patient Instructions (Signed)
Your biopsy results will be mailed to your home within two weeks.  You may resume your routine medications today except for ASPIRIN [place] TODAY.  You need to increase the fiber in your diet due to your hemorrhoids.Continue your reflux medication regimen.  IF you have any questions, call [phone].  Thank-you.

## 2011-04-27 NOTE — Progress Notes (Signed)
Patient did not have preoperative order for IV antibiotic SSI prophylaxis. (G8918)  Patient did not experience any of the following events: a burn prior to discharge; a fall within the facility; wrong site/side/patient/procedure/implant event; or a hospital transfer or hospital admission upon discharge from the facility. (G8907)  

## 2011-04-27 NOTE — Op Note (Signed)
[facility] [address] [place], [place]  [address]  COLONOSCOPY PROCEDURE REPORT  PATIENT:  Willie, Lucas  MR#:  [id number] BIRTHDATE:  26-Jan-1966, 45 yrs. old  GENDER:  male ENDOSCOPIST:  Judie Petit T. Russella Dar, MD, [facility] Referred by:  Stacie Glaze, M.D. PROCEDURE DATE:  04/27/2011 PROCEDURE:  Colonoscopy with snare polypectomy ASA CLASS:  Class II INDICATIONS:  1) unexplained diarrhea MEDICATIONS:   MAC sedation, administered by CRNA, propofol (Diprivan) 400 mg IV DESCRIPTION OF PROCEDURE:   After the risks benefits and alternatives of the procedure were thoroughly explained, informed consent was obtained.  Digital rectal exam was performed and revealed no abnormalities.   The LB 180AL [id number] endoscope was introduced through the anus and advanced to the terminal ileum which was intubated for a short distance, without limitations. The quality of the prep was excellent, using MoviPrep.  The instrument was then slowly withdrawn as the colon was fully examined. <<PROCEDUREIMAGES>> FINDINGS:  A sessile polyp was found in the ascending colon seen best on retroflexed view. It was 10 mm in size. Polyp was snared, then cauterized with monopolar cautery. Retrieval was successful. A sessile polyp was found in the sigmoid colon. It was 5 mm in size. Polyp was snared without cautery. Retrieval was successful. The terminal ileum appeared normal. Otherwise normal colonoscopy without other polyps, masses, vascular ectasias, or inflammatory changes.  Retroflexed views in the rectum revealed internal hemorrhoids, small.  The time to cecum =  2  minutes. The scope was then withdrawn (time =  11  min) from the patient and the procedure completed.  COMPLICATIONS:  None  ENDOSCOPIC IMPRESSION: 1) 10 mm sessile polyp in the ascending colon 2) 5 mm sessile polyp in the sigmoid colon 3) Internal hemorrhoids  RECOMMENDATIONS: 1) Hold aspirin, aspirin products, and  anti-inflammatory medication for 2 weeks. 2) Await pathology results 3) Repeat Colonoscopy in 5 years pending pathology review  Willie Mellone T. Russella Dar, MD, [facility]  n. eSIGNED:   Venita Lick. Jandiel Magallanes at 04/27/2011 09:31 AM  Herbert Seta, [phone]

## 2011-04-27 NOTE — Op Note (Signed)
[facility] [address] [place], [place]  [address]  ENDOSCOPY PROCEDURE REPORT  PATIENT:  Willie Lucas, Willie Lucas  MR#:  [id number] BIRTHDATE:  Jun 06, 1965, 45 yrs. old  GENDER:  male ENDOSCOPIST:  Judie Petit T. Russella Dar, MD, [facility] Referred by:  Stacie Glaze, M.D. PROCEDURE DATE:  04/27/2011 PROCEDURE:  EGD with biopsy, [address] ASA CLASS:  Class II INDICATIONS:  GERD MEDICATIONS:  MAC sedation, administered by CRNA, There was residual sedation effect present from prior procedure, propofol (Diprivan) 100 mg IV TOPICAL ANESTHETIC:  none DESCRIPTION OF PROCEDURE:   After the risks benefits and alternatives of the procedure were thoroughly explained, informed consent was obtained.  The LB [id number] [id number] endoscope was introduced through the mouth and advanced to the second portion of the duodenum, without limitations.  The instrument was slowly withdrawn as the mucosa was fully examined. <<PROCEDUREIMAGES>> There were multiple polyps identified in the body and the antrum of the stomach. They were 2 - 5 mm in size. Multiple biopsies were obtained and sent to pathology.  Otherwise normal stomach. The esophagus and gastroesophageal junction were completely normal in appearance. The duodenal bulb was normal in appearance, as was the postbulbar duodenum.  Retroflexed views revealed no abnormalities. The scope was then withdrawn from the patient and the procedure completed.  COMPLICATIONS:  None  ENDOSCOPIC IMPRESSION: 1) 2 - 5 mm polyps, multiple  RECOMMENDATIONS: 1) Await pathology results 2) Anti-reflux regimen 3) PPI qam  Tymesha Ditmore T. Russella Dar, MD, [facility]  n. eSIGNED:   Venita Lick. Uilani Sanville at 04/27/2011 09:40 AM  Herbert Seta, [phone]

## 2011-04-28 ENCOUNTER — Telehealth: Payer: Self-pay | Admitting: *Deleted

## 2011-04-28 NOTE — Telephone Encounter (Signed)
  Follow up Call-  Call back number 04/27/2011  Post procedure Call Back phone  # [phone]  Permission to leave phone message Yes     Patient questions:  Do you have a fever, pain , or abdominal swelling? no Pain Score  0 *  Have you tolerated food without any problems? yes  Have you been able to return to your normal activities? yes  Do you have any questions about your discharge instructions: Diet   no Medications  no Follow up visit  no  Do you have questions or concerns about your Care? no  Actions: * If pain score is 4 or above: No action needed, pain <4.

## 2011-05-01 ENCOUNTER — Encounter: Payer: Self-pay | Admitting: Gastroenterology

## 2011-12-31 ENCOUNTER — Emergency Department (HOSPITAL_COMMUNITY)
Admission: EM | Admit: 2011-12-31 | Discharge: 2011-12-31 | Disposition: A | Discharge status: Home / Self Care | Payer: Self-pay | Attending: Emergency Medicine | Admitting: Emergency Medicine

## 2011-12-31 ENCOUNTER — Emergency Department (HOSPITAL_COMMUNITY): Payer: Self-pay

## 2011-12-31 DIAGNOSIS — S97109A Crushing injury of unspecified toe(s), initial encounter: Secondary | ICD-10-CM | POA: Insufficient documentation

## 2011-12-31 DIAGNOSIS — G35 Multiple sclerosis: Secondary | ICD-10-CM | POA: Insufficient documentation

## 2011-12-31 DIAGNOSIS — Z8261 Family history of arthritis: Secondary | ICD-10-CM | POA: Insufficient documentation

## 2011-12-31 DIAGNOSIS — Z808 Family history of malignant neoplasm of other organs or systems: Secondary | ICD-10-CM | POA: Insufficient documentation

## 2011-12-31 DIAGNOSIS — Z833 Family history of diabetes mellitus: Secondary | ICD-10-CM | POA: Insufficient documentation

## 2011-12-31 DIAGNOSIS — S97102A Crushing injury of unspecified left toe(s), initial encounter: Secondary | ICD-10-CM

## 2011-12-31 DIAGNOSIS — Z87891 Personal history of nicotine dependence: Secondary | ICD-10-CM | POA: Insufficient documentation

## 2011-12-31 DIAGNOSIS — W208XXA Other cause of strike by thrown, projected or falling object, initial encounter: Secondary | ICD-10-CM | POA: Insufficient documentation

## 2011-12-31 MED ORDER — IBUPROFEN 800 MG PO TABS
800.0000 mg | ORAL_TABLET | Freq: Once | ORAL | Status: DC
  Filled 2011-12-31: qty 1

## 2011-12-31 MED ORDER — NAPROXEN 375 MG PO TABS: 375.0000 mg | ORAL_TABLET | Freq: Two times a day (BID) | ORAL | Status: DC

## 2011-12-31 MED ORDER — TETANUS-DIPHTH-ACELL PERTUSSIS 5-2.5-18.5 LF-MCG/0.5 IM SUSP: 0.5000 mL | Freq: Once | INTRAMUSCULAR | Status: DC

## 2011-12-31 NOTE — ED Provider Notes (Signed)
History     CSN: [id number]  Arrival date & time 12/31/11  1627   First MD Initiated Contact with Patient 12/31/11 1825      Chief Complaint  Patient presents with  . Toe Injury    (Consider location/radiation/quality/duration/timing/severity/associated sxs/prior treatment) HPI Comments: Willie Lucas is a 46 y.o. Male who presents with complaint of toe pain. Pt states he was moving a desk and accidentally dropped it onto left great toe. States pain in the toe and unable to move any of his toes, however states none of the other toes are hurting him. denies pain in ankle or foot. States incident occurred at work, was sent here for eval.    Past Medical History  Diagnosis Date  . Headache   . MS (multiple sclerosis)   . Neuromuscular disorder   . Ulcer     hx of duodenal ulcer at age 60    Past Surgical History  Procedure Date  . Rhinoplasty     X2    Family History  Problem Relation Age of Onset  . Diabetes Mother   . Brain cancer Brother     died  . Arthritis Mother     History  Substance Use Topics  . Smoking status: Former Games developer  . Smokeless tobacco: Never Used  . Alcohol Use: 0.0 oz/week    2-3 Glasses of wine per week      Review of Systems  Constitutional: Negative for fever and chills.  Respiratory: Negative.   Cardiovascular: Negative.   Musculoskeletal: Positive for joint swelling and arthralgias.  Skin: Negative.   Neurological: Negative for weakness and numbness.    Allergies  Review of patient's allergies indicates no known allergies.  Home Medications   Current Outpatient Rx  Name Route Sig Dispense Refill  . DEXLANSOPRAZOLE 60 MG PO CPDR Oral Take 60 mg by mouth daily.    [name] GLATIRAMER ACETATE 20 MG/ML [place] KIT Subcutaneous Inject 20 mg into the skin once a week.     . IBUPROFEN 800 MG PO TABS Oral Take 800 mg by mouth every 8 (eight) hours as needed. Pain    . RIZATRIPTAN BENZOATE 5 MG PO TABS Oral Take 5 mg by mouth as needed. May  repeat in 2 hours if needed migraine    . IBUPROFEN 800 MG PO TABS      . NAPROXEN 375 MG PO TABS Oral Take 1 tablet (375 mg total) by mouth 2 (two) times daily. 20 tablet 0    BP 123/80  Pulse 61  Temp 98.7 F (37.1 C) (Oral)  Resp 14  SpO2 100%  Physical Exam  Vitals reviewed. Constitutional: He is oriented to person, place, and time. He appears well-developed and well-nourished. No distress.  Cardiovascular: Normal rate, regular rhythm and normal heart sounds.   Pulmonary/Chest: Effort normal and breath sounds normal. No respiratory distress. He has no wheezes. He has no rales.  Musculoskeletal:       Normal appearing toe other than small superficial laceration slightly proximal to the toe nail. No bleeding. Finger nail normal. Toe tender to palpation, pain with movement at MTP and IP joint. No pain with passive movement of toes 2-5.  Neurological: He is alert and oriented to person, place, and time.  Skin: Skin is warm and dry.  Psychiatric: He has a normal mood and affect.    ED Course  Procedures (including critical care time)  Labs Reviewed - No data to display Dg Foot Complete Left  12/31/2011  *  RADIOLOGY REPORT*  Clinical Data: Great toe pain, trauma  LEFT FOOT - COMPLETE 3+ VIEW  Comparison: None.  Findings: No fracture or dislocation.  No soft tissue abnormality. No radiopaque foreign body.  IMPRESSION: No acute osseous abnormality.   Original Report Authenticated By: Harrel Lemon, M.D.    Negative x-ray. Pt refuse ibuprofen, refused tetanus, refused post op boot that i ordered for him. He will be d/c home with follow up with orthopedics. His main concern is the fact he is unable to move the toe. i suspect this could be due to pain, but it is possible that he injured the extensor tendon. Explained this to him, and the need to follow up with PCP if still unable to move it in 2-3 days. Pt voiced understanding.   1. Crushing injury of toe of left foot       MDM           Lottie Mussel, PA 12/31/11 2022

## 2011-12-31 NOTE — ED Notes (Signed)
Pt present to ED with c/o left foot/toe injury.  Pt eports dropping a desk on his foot.  Pt unable to wiggle toes.  Pt reports numbness to toes.  Pt reports radiating pain up left leg.  No obvious deformities noted

## 2011-12-31 NOTE — ED Notes (Signed)
Pt verbalizes understanding 

## 2012-01-01 NOTE — ED Provider Notes (Signed)
Medical screening examination/treatment/procedure(s) were performed by non-physician practitioner and as supervising physician I was immediately available for consultation/collaboration.  Cari Burgo R Tonatiuh Mallon, MD 01/01/12 0004 

## 2012-04-05 ENCOUNTER — Ambulatory Visit (INDEPENDENT_AMBULATORY_CARE_PROVIDER_SITE_OTHER): Payer: BC Managed Care – PPO | Admitting: Family

## 2012-04-05 ENCOUNTER — Encounter: Payer: Self-pay | Admitting: Family

## 2012-04-05 VITALS — BP 130/84 | HR 87 | Wt 190.0 lb

## 2012-04-05 DIAGNOSIS — F411 Generalized anxiety disorder: Secondary | ICD-10-CM

## 2012-04-05 DIAGNOSIS — F410 Panic disorder [episodic paroxysmal anxiety] without agoraphobia: Secondary | ICD-10-CM

## 2012-04-05 DIAGNOSIS — F419 Anxiety disorder, unspecified: Secondary | ICD-10-CM

## 2012-04-05 MED ORDER — CLONAZEPAM 0.5 MG PO TABS: 0.5000 mg | ORAL_TABLET | Freq: Two times a day (BID) | ORAL | Status: DC | PRN

## 2012-04-05 MED ORDER — PAROXETINE HCL 20 MG PO TABS: 20.0000 mg | ORAL_TABLET | ORAL | Status: DC

## 2012-04-05 NOTE — Progress Notes (Signed)
Subjective:    Patient ID: Willie Lucas, male    DOB: 06-Aug-1965, 47 y.o.   MRN: [id number]  HPI 47 year old white male, nonsmoker, patient of Dr. Lovell Sheehan is in today with complaints of anxiety and panic attacks x1 month. Patient's son is in a rehabilitation facility after overdosing on LSD. Patient reports his son taken 3 tablets of LSD, landing him in ICU. He was discharged and is now under 60 day recovery program in [place]. The patient has had great difficulty dealing with his sons addiction. He has a great support system. Has difficulty concentrating, sleeping, shortness of breath. He has a history of migraine headaches but has not had a headache in about 3 weeks. Takes Maxalt as necessary.   Review of Systems  Constitutional: Negative.   Respiratory: Negative.   Cardiovascular: Negative.   Gastrointestinal: Negative.   Musculoskeletal: Negative.   Skin: Negative.   Psychiatric/Behavioral: Positive for sleep disturbance. Negative for suicidal ideas and self-injury. The patient is nervous/anxious and is hyperactive.    Past Medical History  Diagnosis Date  . Headache   . MS (multiple sclerosis)   . Neuromuscular disorder   . Ulcer     hx of duodenal ulcer at age 47    History   Social History  . Marital Status: Married    Spouse Name: N/A    Number of Children: N/A  . Years of Education: N/A   Occupational History  . Electrical engineer    Social History Main Topics  . Smoking status: Former Games developer  . Smokeless tobacco: Never Used  . Alcohol Use: 0.0 oz/week    2-3 Glasses of wine per week  . Drug Use: No  . Sexually Active: Not on file   Other Topics Concern  . Not on file   Social History Narrative  . No narrative on file    Past Surgical History  Procedure Date  . Rhinoplasty     X2    Family History  Problem Relation Age of Onset  . Diabetes Mother   . Brain cancer Brother     died  . Arthritis Mother     No Known Allergies  Current  Outpatient Prescriptions on File Prior to Visit  Medication Sig Dispense Refill  . dexlansoprazole (DEXILANT) 60 MG capsule Take 60 mg by mouth daily.      [name] ibuprofen (ADVIL,MOTRIN) 800 MG tablet Take 800 mg by mouth every 8 (eight) hours as needed. Pain      . naproxen (NAPROSYN) 375 MG tablet Take 1 tablet (375 mg total) by mouth 2 (two) times daily.  20 tablet  0  . rizatriptan (MAXALT) 5 MG tablet Take 5 mg by mouth as needed. May repeat in 2 hours if needed migraine      . clonazePAM (KLONOPIN) 0.5 MG tablet Take 1 tablet (0.5 mg total) by mouth 2 (two) times daily as needed for anxiety.  60 tablet  0  . glatiramer (COPAXONE) 20 MG/ML injection Inject 20 mg into the skin once a week.       [name] PARoxetine (PAXIL) 20 MG tablet Take 1 tablet (20 mg total) by mouth every morning.  30 tablet  3    BP 130/84  Pulse 87  Wt 190 lb (86.183 kg)  SpO2 98%chart    Objective:   Physical Exam  Constitutional: He appears well-developed and well-nourished.  Neck: Normal range of motion. Neck supple.  Cardiovascular: Normal rate, regular rhythm and normal heart sounds.   Pulmonary/Chest:  Effort normal and breath sounds normal.  Abdominal: Soft. Bowel sounds are normal.  Musculoskeletal: Normal range of motion.  Neurological: He is alert.  Skin: Skin is warm and dry.  Psychiatric: He has a normal mood and affect.          Assessment & Plan:  Assessment: Anxiety, Panic attacks  Plan: Start Paxil 20 mg once daily. Klonopin 0.5 mg one half to one tablet twice a day as needed. Exercise a minimum of 30 minutes a day. Call the office if symptoms worsen or persist. Recheck in 3 weeks and sooner as needed. Keep appointment with psychotherapy.

## 2012-04-05 NOTE — Patient Instructions (Signed)

## 2012-04-21 ENCOUNTER — Other Ambulatory Visit: Payer: Self-pay

## 2012-04-21 ENCOUNTER — Other Ambulatory Visit: Payer: Self-pay | Admitting: Gastroenterology

## 2012-04-21 MED ORDER — DEXLANSOPRAZOLE 60 MG PO CPDR: 60.0000 mg | DELAYED_RELEASE_CAPSULE | Freq: Every day | ORAL | Status: DC

## 2012-04-21 MED ORDER — HYOSCYAMINE SULFATE 0.125 MG SL SUBL: SUBLINGUAL_TABLET | SUBLINGUAL | Status: DC

## 2012-04-21 NOTE — Telephone Encounter (Signed)
Patient given one refill and told he needs to schedule an office visit for further refills or can get medication refilled through his PCP. Pt agreed.

## 2012-04-25 ENCOUNTER — Ambulatory Visit: Payer: BC Managed Care – PPO | Admitting: Family

## 2012-04-26 ENCOUNTER — Ambulatory Visit: Payer: BC Managed Care – PPO | Admitting: Family

## 2012-06-06 ENCOUNTER — Ambulatory Visit (INDEPENDENT_AMBULATORY_CARE_PROVIDER_SITE_OTHER): Payer: BC Managed Care – PPO | Admitting: Family

## 2012-06-06 ENCOUNTER — Encounter: Payer: Self-pay | Admitting: Family

## 2012-06-06 VITALS — BP 140/80 | HR 68 | Wt 187.0 lb

## 2012-06-06 DIAGNOSIS — G4700 Insomnia, unspecified: Secondary | ICD-10-CM

## 2012-06-06 DIAGNOSIS — G44209 Tension-type headache, unspecified, not intractable: Secondary | ICD-10-CM

## 2012-06-06 DIAGNOSIS — F411 Generalized anxiety disorder: Secondary | ICD-10-CM

## 2012-06-06 MED ORDER — METHYLPREDNISOLONE 4 MG PO KIT: PACK | ORAL | Status: AC

## 2012-06-06 MED ORDER — OMEPRAZOLE 40 MG PO CPDR: 40.0000 mg | DELAYED_RELEASE_CAPSULE | Freq: Every day | ORAL | Status: DC

## 2012-06-06 NOTE — Patient Instructions (Addendum)
Temporomandibular Joint Pain Your exam shows that you have a problem with your temporomandibular joint (TMJ), the joint that moves when you open your mouth or chew food. TMJ problems can result from direct injuries, bite abnormalities, or tension states which cause you to grind or clench your teeth. Typical symptoms include pain around the joint, clicking, restricted movement, and headaches. The TMJ is like any other joint in the body; when it is strained, it needs rest to repair itself. To keep the joint at rest it is important that you do not open your mouth wider than the width of your index finger. If you must yawn, be sure to support your chin with your hand so your mouth does not open wide. Eat a soft diet (nothing firmer than ground beef, no raw vegetables), do not chew gum and do not talk if it causes you pain. Apply topical heat by using a warm, moist cloth placed in front of the ear for 15 to 20 minutes several times daily. Alternating heat and ice may give even more relief. Anti-inflammatory pain medicine and muscle relaxants can also be helpful. A dental orthotic or splint may be used for temporary relief. Long-term problems may require treatment for stress as well as braces or surgery. Please check with your doctor or dentist if your symptoms do not improve within one week. Document Released: 04/16/2004 Document Revised: 06/01/2011 Document Reviewed: 03/09/2005 [facility] Patient Information 2013 [place], [place].    Tension Headache A tension headache is a feeling of pain, pressure, or aching often felt over the front and sides of the head. The pain can be dull or can feel tight (constricting). It is the most common type of headache. Tension headaches are not normally associated with nausea or vomiting and do not get worse with physical activity. Tension headaches can last 30 minutes to several days.  CAUSES  The exact cause is not known, but it may be caused by chemicals and hormones in the  brain that lead to pain. Tension headaches often begin after stress, anxiety, or depression. Other triggers may include:  Alcohol.  Caffeine (too much or withdrawal).  Respiratory infections (colds, flu, sinus infections).  Dental problems or teeth clenching.  Fatigue.  Holding your head and neck in one position too long while using a computer. SYMPTOMS   Pressure around the head.   Dull, aching head pain.   Pain felt over the front and sides of the head.   Tenderness in the muscles of the head, neck, and shoulders. DIAGNOSIS  A tension headache is often diagnosed based on:   Symptoms.   Physical examination.   A CT scan or MRI of your head. These tests may be ordered if symptoms are severe or unusual. TREATMENT  Medicines may be given to help relieve symptoms.  HOME CARE INSTRUCTIONS   Only take over-the-counter or prescription medicines for pain or discomfort as directed by your caregiver.   Lie down in a dark, quiet room when you have a headache.   Keep a journal to find out what may be triggering your headaches. For example, write down:  What you eat and drink.  How much sleep you get.  Any change to your diet or medicines.  Try massage or other relaxation techniques.   Ice packs or heat applied to the head and neck can be used. Use these 3 to 4 times per day for 15 to 20 minutes each time, or as needed.   Limit stress.   Sit  up straight, and do not tense your muscles.   Quit smoking if you smoke.  Limit alcohol use.  Decrease the amount of caffeine you drink, or stop drinking caffeine.  Eat and exercise regularly.  Get 7 to 9 hours of sleep, or as recommended by your caregiver.  Avoid excessive use of pain medicine as recurrent headaches can occur.  SEEK MEDICAL CARE IF:   You have problems with the medicines you were prescribed.  Your medicines do not work.  You have a change from the usual headache.  You have nausea or  vomiting. SEEK IMMEDIATE MEDICAL CARE IF:   Your headache becomes severe.  You have a fever.  You have a stiff neck.  You have loss of vision.  You have muscular weakness or loss of muscle control.  You lose your balance or have trouble walking.  You feel faint or pass out.  You have severe symptoms that are different from your first symptoms. MAKE SURE YOU:   Understand these instructions.  Will watch your condition.  Will get help right away if you are not doing well or get worse. Document Released: 03/09/2005 Document Revised: 06/01/2011 Document Reviewed: 02/27/2011 [facility] Patient Information 2013 [place], [place].

## 2012-06-06 NOTE — Progress Notes (Signed)
Subjective:    Patient ID: Willie Lucas, male    DOB: May 04, 1965, 47 y.o.   MRN: [id number]  HPI 47 year old white male, nonsmoker, patient of Dr. Lovell Sheehan is in today with complaints of tension headaches and clenching his teeth at night. Patient reports his symptoms being stress-related. He was on Paxil 20 mg once a day postop the medication because it made him go crazy. He has taken the Klonopin 0.5 mg approximately 4 times that is helped his anxiety when he does needed. Is also try Motrin for her symptoms that's been ineffective. Patient reports tension in the back of his head extending to the right side of his neck that tends to be worse at night. She had been occurring almost nightly.  Patient has a history of GERD and is currently taking Dexilant for his symptoms. He would like to consider other alternative due to cost. His reflux has been well controlled on current medication.   Review of Systems  Constitutional: Negative.   Respiratory: Negative.   Cardiovascular: Negative.   Gastrointestinal: Negative.   Endocrine: Negative.   Genitourinary: Negative.   Musculoskeletal: Positive for myalgias. Negative for back pain, joint swelling and gait problem.       Neck pain  Allergic/Immunologic: Negative.   Neurological: Positive for headaches. Negative for dizziness.  Hematological: Negative.   Psychiatric/Behavioral: Positive for sleep disturbance. Negative for self-injury and agitation. The patient is nervous/anxious.    Past Medical History  Diagnosis Date  . Headache   . MS (multiple sclerosis)   . Neuromuscular disorder   . Ulcer     hx of duodenal ulcer at age 53    History   Social History  . Marital Status: Married    Spouse Name: N/A    Number of Children: N/A  . Years of Education: N/A   Occupational History  . Electrical engineer    Social History Main Topics  . Smoking status: Former Games developer  . Smokeless tobacco: Never Used  . Alcohol Use: 0.0 oz/week    2-3  Glasses of wine per week  . Drug Use: No  . Sexually Active: Not on file   Other Topics Concern  . Not on file   Social History Narrative  . No narrative on file    Past Surgical History  Procedure Laterality Date  . Rhinoplasty      X2    Family History  Problem Relation Age of Onset  . Diabetes Mother   . Brain cancer Brother     died  . Arthritis Mother     No Known Allergies  Current Outpatient Prescriptions on File Prior to Visit  Medication Sig Dispense Refill  . clonazePAM (KLONOPIN) 0.5 MG tablet Take 1 tablet (0.5 mg total) by mouth 2 (two) times daily as needed for anxiety.  60 tablet  0  . dexlansoprazole (DEXILANT) 60 MG capsule Take 1 capsule (60 mg total) by mouth daily.  30 capsule  0  . glatiramer (COPAXONE) 20 MG/ML injection Inject 20 mg into the skin once a week.       . hyoscyamine (LEVSIN SL) 0.125 MG SL tablet 1-2 tablets by mouth every 4 hours as needed  60 tablet  0  . ibuprofen (ADVIL,MOTRIN) 800 MG tablet Take 800 mg by mouth every 8 (eight) hours as needed. Pain      . naproxen (NAPROSYN) 375 MG tablet Take 1 tablet (375 mg total) by mouth 2 (two) times daily.  20 tablet  0  .  rizatriptan (MAXALT) 5 MG tablet Take 5 mg by mouth as needed. May repeat in 2 hours if needed migraine      . PARoxetine (PAXIL) 20 MG tablet Take 1 tablet (20 mg total) by mouth every morning.  30 tablet  3   No current facility-administered medications on file prior to visit.    BP 140/80  Pulse 68  Wt 187 lb (84.823 kg)  BMI 26.83 kg/m2  SpO2 98%chart    Objective:   Physical Exam  Constitutional: He is oriented to person, place, and time. He appears well-developed and well-nourished.  Neck: Neck supple.  Tenderness to palpation of the right posterior neck. Muscle tension noted. Pain with flexion  Cardiovascular: Normal rate, regular rhythm and normal heart sounds.   Pulmonary/Chest: Effort normal and breath sounds normal.  Musculoskeletal: Normal range of  motion.  Neurological: He is alert and oriented to person, place, and time. He displays normal reflexes. No cranial nerve deficit. Coordination normal.  Skin: Skin is warm and dry.  Psychiatric: He has a normal mood and affect.          Assessment & Plan:  Assessment:  1. Anxiety 2. Tension headaches 3. GERD  Plan: Change GERD medicine to omeprazole 40 mg once daily. Start taking, 0.5 mg at bedtime when he has acute tension. However, we will treat with a Medrol Dosepak to give recurrent tension and bring his symptoms under control. Patient call the office with any questions or concerns recheck a schedule, and as needed.

## 2013-02-28 ENCOUNTER — Other Ambulatory Visit (INDEPENDENT_AMBULATORY_CARE_PROVIDER_SITE_OTHER): Payer: BC Managed Care – PPO

## 2013-02-28 DIAGNOSIS — Z0000 Encounter for general adult medical examination without abnormal findings: Secondary | ICD-10-CM

## 2013-02-28 LAB — BASIC METABOLIC PANEL
BUN: 19 mg/dL (ref 6–23)
CO2: 26 mEq/L (ref 19–32)
Calcium: 8.8 mg/dL (ref 8.4–10.5)
Chloride: 102 mEq/L (ref 96–112)
Creatinine, Ser: 0.9 mg/dL (ref 0.4–1.5)
GFR: 92.96 mL/min (ref >60.00)
Glucose, Bld: 92 mg/dL (ref 70–99)
Potassium: 4.4 mEq/L (ref 3.5–5.1)
Sodium: 135 mEq/L (ref 135–145)

## 2013-02-28 LAB — CBC WITH DIFFERENTIAL/PLATELET
Basophils Absolute: 0 10*3/uL (ref 0.0–0.1)
Basophils Relative: 0.7 % (ref 0.0–3.0)
Eosinophils Absolute: 0.1 10*3/uL (ref 0.0–0.7)
Eosinophils Relative: 2.1 % (ref 0.0–5.0)
HCT: 46.1 % (ref 39.0–52.0)
Hemoglobin: 15.5 g/dL (ref 13.0–17.0)
Lymphocytes Relative: 33.1 % (ref 12.0–46.0)
Lymphs Abs: 2.1 10*3/uL (ref 0.7–4.0)
MCHC: 33.7 g/dL (ref 30.0–36.0)
MCV: 93.9 fl (ref 78.0–100.0)
Monocytes Absolute: 0.6 10*3/uL (ref 0.1–1.0)
Monocytes Relative: 9.1 % (ref 3.0–12.0)
Neutro Abs: 3.4 10*3/uL (ref 1.4–7.7)
Neutrophils Relative %: 55 % (ref 43.0–77.0)
Platelets: 134 10*3/uL — ABNORMAL LOW (ref 150.0–400.0)
RBC: 4.91 Mil/uL (ref 4.22–5.81)
RDW: 12.8 % (ref 11.5–14.6)
WBC: 6.3 10*3/uL (ref 4.5–10.5)

## 2013-02-28 LAB — POCT URINALYSIS DIPSTICK
Bilirubin, UA: NEGATIVE
Blood, UA: NEGATIVE
Glucose, UA: NEGATIVE
Ketones, UA: NEGATIVE
Leukocytes, UA: NEGATIVE
Nitrite, UA: NEGATIVE
Protein, UA: NEGATIVE
Spec Grav, UA: 1.025
Urobilinogen, UA: 0.2
pH, UA: 5.5

## 2013-02-28 LAB — HEPATIC FUNCTION PANEL
ALT: 24 U/L (ref 0–53)
AST: 28 U/L (ref 0–37)
Albumin: 4.3 g/dL (ref 3.5–5.2)
Alkaline Phosphatase: 75 U/L (ref 39–117)
Bilirubin, Direct: 0.1 mg/dL (ref 0.0–0.3)
Total Bilirubin: 0.9 mg/dL (ref 0.3–1.2)
Total Protein: 6.9 g/dL (ref 6.0–8.3)

## 2013-02-28 LAB — LDL CHOLESTEROL, DIRECT: Direct LDL: 176.8 mg/dL

## 2013-02-28 LAB — LIPID PANEL
Cholesterol: 246 mg/dL — ABNORMAL HIGH (ref 0–200)
HDL: 54.8 mg/dL (ref >39.00)
Total CHOL/HDL Ratio: 4
Triglycerides: 59 mg/dL (ref 0.0–149.0)
VLDL: 11.8 mg/dL (ref 0.0–40.0)

## 2013-02-28 LAB — TSH: TSH: 1.11 u[IU]/mL (ref 0.35–5.50)

## 2013-02-28 LAB — TESTOSTERONE: Testosterone: 628.79 ng/dL (ref 350.00–890.00)

## 2013-03-08 ENCOUNTER — Ambulatory Visit (INDEPENDENT_AMBULATORY_CARE_PROVIDER_SITE_OTHER): Payer: BC Managed Care – PPO | Admitting: Family

## 2013-03-08 ENCOUNTER — Encounter: Payer: Self-pay | Admitting: Family

## 2013-03-08 VITALS — BP 118/78 | HR 72 | Ht 69.25 in | Wt 190.0 lb

## 2013-03-08 DIAGNOSIS — Z0000 Encounter for general adult medical examination without abnormal findings: Secondary | ICD-10-CM

## 2013-03-08 DIAGNOSIS — F411 Generalized anxiety disorder: Secondary | ICD-10-CM

## 2013-03-08 DIAGNOSIS — E7800 Pure hypercholesterolemia, unspecified: Secondary | ICD-10-CM

## 2013-03-08 MED ORDER — SIMVASTATIN 20 MG PO TABS: 20.0000 mg | ORAL_TABLET | Freq: Every day | ORAL | Status: DC

## 2013-03-08 NOTE — Patient Instructions (Signed)
Fat and Cholesterol Control Diet  Fat and cholesterol levels in your blood and organs are influenced by your diet. High levels of fat and cholesterol may lead to diseases of the heart, small and large blood vessels, gallbladder, liver, and pancreas.  CONTROLLING FAT AND CHOLESTEROL WITH DIET  Although exercise and lifestyle factors are important, your diet is key. That is because certain foods are known to raise cholesterol and others to lower it. The goal is to balance foods for their effect on cholesterol and more importantly, to replace saturated and trans fat with other types of fat, such as monounsaturated fat, polyunsaturated fat, and omega-3 fatty acids.  On average, a person should consume no more than 15 to 17 g of saturated fat daily. Saturated and trans fats are considered "bad" fats, and they will raise LDL cholesterol. Saturated fats are primarily found in animal products such as meats, butter, and cream. However, that does not mean you need to give up all your favorite foods. Today, there are good tasting, low-fat, low-cholesterol substitutes for most of the things you like to eat. Choose low-fat or nonfat alternatives. Choose round or loin cuts of red meat. These types of cuts are lowest in fat and cholesterol. Chicken (without the skin), fish, veal, and ground turkey breast are great choices. Eliminate fatty meats, such as hot dogs and salami. Even shellfish have little or no saturated fat. Have a 3 oz (85 g) portion when you eat lean meat, poultry, or fish.  Trans fats are also called "partially hydrogenated oils." They are oils that have been scientifically manipulated so that they are solid at room temperature resulting in a longer shelf life and improved taste and texture of foods in which they are added. Trans fats are found in stick margarine, some tub margarines, cookies, crackers, and baked goods.   When baking and cooking, oils are a great substitute for butter. The monounsaturated oils are  especially beneficial since it is believed they lower LDL and raise HDL. The oils you should avoid entirely are saturated tropical oils, such as coconut and palm.   Remember to eat a lot from food groups that are naturally free of saturated and trans fat, including fish, fruit, vegetables, beans, grains (barley, rice, couscous, bulgur wheat), and pasta (without cream sauces).   IDENTIFYING FOODS THAT LOWER FAT AND CHOLESTEROL   Soluble fiber may lower your cholesterol. This type of fiber is found in fruits such as apples, vegetables such as broccoli, potatoes, and carrots, legumes such as beans, peas, and lentils, and grains such as barley. Foods fortified with plant sterols (phytosterol) may also lower cholesterol. You should eat at least 2 g per day of these foods for a cholesterol lowering effect.   Read package labels to identify low-saturated fats, trans fat free, and low-fat foods at the supermarket. Select cheeses that have only 2 to 3 g saturated fat per ounce. Use a heart-healthy tub margarine that is free of trans fats or partially hydrogenated oil. When buying baked goods (cookies, crackers), avoid partially hydrogenated oils. Breads and muffins should be made from whole grains (whole-wheat or whole oat flour, instead of "flour" or "enriched flour"). Buy non-creamy canned soups with reduced salt and no added fats.   FOOD PREPARATION TECHNIQUES   Never deep-fry. If you must fry, either stir-fry, which uses very little fat, or use non-stick cooking sprays. When possible, broil, bake, or roast meats, and steam vegetables. Instead of putting butter or margarine on vegetables, use lemon   and herbs, applesauce, and cinnamon (for squash and sweet potatoes). Use nonfat yogurt, salsa, and low-fat dressings for salads.   LOW-SATURATED FAT / LOW-FAT FOOD SUBSTITUTES  Meats / Saturated Fat (g)  · Avoid: Steak, marbled (3 oz/85 g) / 11 g  · Choose: Steak, lean (3 oz/85 g) / 4 g  · Avoid: Hamburger (3 oz/85 g) / 7  g  · Choose: Hamburger, lean (3 oz/85 g) / 5 g  · Avoid: Ham (3 oz/85 g) / 6 g  · Choose: Ham, lean cut (3 oz/85 g) / 2.4 g  · Avoid: Chicken, with skin, dark meat (3 oz/85 g) / 4 g  · Choose: Chicken, skin removed, dark meat (3 oz/85 g) / 2 g  · Avoid: Chicken, with skin, light meat (3 oz/85 g) / 2.5 g  · Choose: Chicken, skin removed, light meat (3 oz/85 g) / 1 g  Dairy / Saturated Fat (g)  · Avoid: Whole milk (1 cup) / 5 g  · Choose: Low-fat milk, 2% (1 cup) / 3 g  · Choose: Low-fat milk, 1% (1 cup) / 1.5 g  · Choose: Skim milk (1 cup) / 0.3 g  · Avoid: Hard cheese (1 oz/28 g) / 6 g  · Choose: Skim milk cheese (1 oz/28 g) / 2 to 3 g  · Avoid: Cottage cheese, 4% fat (1 cup) / 6.5 g  · Choose: Low-fat cottage cheese, 1% fat (1 cup) / 1.5 g  · Avoid: Ice cream (1 cup) / 9 g  · Choose: Sherbet (1 cup) / 2.5 g  · Choose: Nonfat frozen yogurt (1 cup) / 0.3 g  · Choose: Frozen fruit bar / trace  · Avoid: Whipped cream (1 tbs) / 3.5 g  · Choose: Nondairy whipped topping (1 tbs) / 1 g  Condiments / Saturated Fat (g)  · Avoid: Mayonnaise (1 tbs) / 2 g  · Choose: Low-fat mayonnaise (1 tbs) / 1 g  · Avoid: Butter (1 tbs) / 7 g  · Choose: Extra light margarine (1 tbs) / 1 g  · Avoid: Coconut oil (1 tbs) / 11.8 g  · Choose: Olive oil (1 tbs) / 1.8 g  · Choose: Corn oil (1 tbs) / 1.7 g  · Choose: Safflower oil (1 tbs) / 1.2 g  · Choose: Sunflower oil (1 tbs) / 1.4 g  · Choose: Soybean oil (1 tbs) / 2.4 g  · Choose: Canola oil (1 tbs) / 1 g  Document Released: 03/09/2005 Document Revised: 07/04/2012 Document Reviewed: 08/28/2010  [employer]® Patient Information ©2014 [employer].

## 2013-03-08 NOTE — Progress Notes (Signed)
Subjective:    Patient ID: Willie Lucas, male    DOB: 06/26/1965, 47 y.o.   MRN: [id number]  HPI  47 year old male, nonsmoker, patient of Dr. Lovell Sheehan, presents for yearly preventative medicine examination. All immunizations and health maintenance protocols were reviewed with the patient and they are up to date with these protocols. Screening laboratory values were reviewed with the patient including screening of hyperlipidemia PSA renal function and hepatic function. There medications past medical history social history problem list and allergies were reviewed in detail.   Goals were established with regard to weight loss exercise diet in compliance with medications   Review of Systems  Constitutional: Negative.   HENT: Negative.   Eyes: Negative.   Respiratory: Negative.   Cardiovascular: Negative.   Gastrointestinal: Negative.   Endocrine: Negative.   Genitourinary: Negative.   Musculoskeletal: Negative.   Skin: Negative.   Allergic/Immunologic: Negative.   Neurological: Negative.   Hematological: Negative.   Psychiatric/Behavioral: Negative.    Past Medical History  Diagnosis Date  . Headache(784.0)   . MS (multiple sclerosis)   . Neuromuscular disorder   . Ulcer     hx of duodenal ulcer at age 67    History   Social History  . Marital Status: Married    Spouse Name: N/A    Number of Children: N/A  . Years of Education: N/A   Occupational History  . Electrical engineer    Social History Main Topics  . Smoking status: Former Games developer  . Smokeless tobacco: Never Used  . Alcohol Use: 0.0 oz/week    2-3 Glasses of wine per week  . Drug Use: No  . Sexual Activity: Not on file   Other Topics Concern  . Not on file   Social History Narrative  . No narrative on file    Past Surgical History  Procedure Laterality Date  . Rhinoplasty      X2    Family History  Problem Relation Age of Onset  . Diabetes Mother   . Brain cancer Brother     died  .  Arthritis Mother     No Known Allergies  Current Outpatient Prescriptions on File Prior to Visit  Medication Sig Dispense Refill  . clonazePAM (KLONOPIN) 0.5 MG tablet Take 1 tablet (0.5 mg total) by mouth 2 (two) times daily as needed for anxiety.  60 tablet  0  . hyoscyamine (LEVSIN SL) 0.125 MG SL tablet 1-2 tablets by mouth every 4 hours as needed  60 tablet  0  . omeprazole (PRILOSEC) 40 MG capsule Take 1 capsule (40 mg total) by mouth daily.  30 capsule  3  . rizatriptan (MAXALT) 5 MG tablet Take 5 mg by mouth as needed. May repeat in 2 hours if needed migraine      . glatiramer (COPAXONE) 20 MG/ML injection Inject 20 mg into the skin once a week.       [name] ibuprofen (ADVIL,MOTRIN) 800 MG tablet Take 800 mg by mouth every 8 (eight) hours as needed. Pain      . naproxen (NAPROSYN) 375 MG tablet Take 1 tablet (375 mg total) by mouth 2 (two) times daily.  20 tablet  0  . PARoxetine (PAXIL) 20 MG tablet Take 1 tablet (20 mg total) by mouth every morning.  30 tablet  3   No current facility-administered medications on file prior to visit.    BP 118/78  Pulse 72  Ht 5' 9.25" (1.759 m)  Wt 190  lb (86.183 kg)  BMI 27.85 kg/m2chart    Objective:   Physical Exam  Constitutional: He is oriented to person, place, and time. He appears well-developed and well-nourished.  HENT:  Head: Normocephalic.  Right Ear: External ear normal.  Left Ear: External ear normal.  Nose: Nose normal.  Mouth/Throat: Oropharynx is clear and moist.  Eyes: Conjunctivae and EOM are normal. Pupils are equal, round, and reactive to light.  Neck: Normal range of motion. Neck supple. No thyromegaly present.  Cardiovascular: Normal rate, regular rhythm and normal heart sounds.   Pulmonary/Chest: Effort normal and breath sounds normal.  Abdominal: Soft. Bowel sounds are normal. He exhibits no distension. There is no tenderness. There is no rebound.  Genitourinary: Rectum normal, prostate normal and penis normal.  Guaiac negative stool. No penile tenderness.  Musculoskeletal: Normal range of motion. He exhibits no edema and no tenderness.  Neurological: He is alert and oriented to person, place, and time. He has normal reflexes. He displays normal reflexes. No cranial nerve deficit. Coordination normal.  Skin: Skin is warm and dry.  Psychiatric: He has a normal mood and affect.          Assessment & Plan:  Assessment: 1. CPX 2. Multiple Sclerosis 3. Hypercholesterolemia  Plan: Encouraged healthy diet, exercise. Start simvastatin 20 mg once daily. Discussed potential side effects. Recheck in 6 weeks for LFTs and lipids. Low cholesterol diet. Patient the office with any questions or concerns.

## 2013-04-19 ENCOUNTER — Other Ambulatory Visit (INDEPENDENT_AMBULATORY_CARE_PROVIDER_SITE_OTHER): Payer: BC Managed Care – PPO

## 2013-04-19 ENCOUNTER — Encounter: Payer: Self-pay | Admitting: *Deleted

## 2013-04-19 DIAGNOSIS — E785 Hyperlipidemia, unspecified: Secondary | ICD-10-CM

## 2013-04-19 LAB — HEPATIC FUNCTION PANEL
ALT: 24 U/L (ref 0–53)
AST: 27 U/L (ref 0–37)
Albumin: 4.3 g/dL (ref 3.5–5.2)
Alkaline Phosphatase: 78 U/L (ref 39–117)
Bilirubin, Direct: 0 mg/dL (ref 0.0–0.3)
Total Bilirubin: 1 mg/dL (ref 0.3–1.2)
Total Protein: 6.9 g/dL (ref 6.0–8.3)

## 2013-04-19 LAB — LIPID PANEL
Cholesterol: 182 mg/dL (ref 0–200)
HDL: 56.2 mg/dL (ref >39.00)
LDL Cholesterol: 106 mg/dL — ABNORMAL HIGH (ref 0–99)
Total CHOL/HDL Ratio: 3
Triglycerides: 98 mg/dL (ref 0.0–149.0)
VLDL: 19.6 mg/dL (ref 0.0–40.0)

## 2013-04-28 ENCOUNTER — Telehealth: Payer: Self-pay | Admitting: Internal Medicine

## 2013-04-28 NOTE — Telephone Encounter (Signed)
Pt informed improved with statin

## 2013-04-28 NOTE — Telephone Encounter (Signed)
Pt would like the results of his cholesterol labs he had done on 04/19/13.  Pt gives permission to leave a detailed message on his voicemail, he is currently boarding a plane and may not be able to answer. Thanks

## 2013-08-28 ENCOUNTER — Other Ambulatory Visit: Payer: Self-pay | Admitting: Family

## 2013-10-05 ENCOUNTER — Ambulatory Visit (INDEPENDENT_AMBULATORY_CARE_PROVIDER_SITE_OTHER): Payer: BC Managed Care – PPO | Admitting: Physician Assistant

## 2013-10-05 ENCOUNTER — Encounter: Payer: Self-pay | Admitting: Physician Assistant

## 2013-10-05 VITALS — BP 104/70 | HR 72 | Temp 98.6°F | Resp 18 | Wt 199.0 lb

## 2013-10-05 DIAGNOSIS — R197 Diarrhea, unspecified: Secondary | ICD-10-CM

## 2013-10-05 NOTE — Progress Notes (Signed)
Pre visit review using our clinic review tool, if applicable. No additional management support is needed unless otherwise documented below in the visit note. 

## 2013-10-05 NOTE — Patient Instructions (Signed)
Try some imodium for the diarrhea.  Stay hydrated, continue gatorade.  If emergency symptoms discussed during visit developed, seek medical attention immediately.  Followup as needed, or for worsening or persistent symptoms despite treatment.   Diarrhea Diarrhea is watery poop (stool). It can make you feel weak, tired, thirsty, or give you a dry mouth (signs of dehydration). Watery poop is a sign of another problem, most often an infection. It often lasts 2-3 days. It can last longer if it is a sign of something serious. Take care of yourself as told by your doctor. HOME CARE   Drink 1 cup (8 ounces) of fluid each time you have watery poop.  Do not drink the following fluids:  Those that contain simple sugars (fructose, glucose, galactose, lactose, sucrose, maltose).  Sports drinks.  Fruit juices.  Whole milk products.  Sodas.  Drinks with caffeine (coffee, tea, soda) or alcohol.  Oral rehydration solution may be used if the doctor says it is okay. You may make your own solution. Follow this recipe:   - teaspoon table salt.   teaspoon baking soda.   teaspoon salt substitute containing potassium chloride.  1 tablespoons sugar.  1 liter (34 ounces) of water.  Avoid the following foods:  High fiber foods, such as raw fruits and vegetables.  Nuts, seeds, and whole grain breads and cereals.   Those that are sweetened with sugar alcohols (xylitol, sorbitol, mannitol).  Try eating the following foods:  Starchy foods, such as rice, toast, pasta, low-sugar cereal, oatmeal, baked potatoes, crackers, and bagels.  Bananas.  Applesauce.  Eat probiotic-rich foods, such as yogurt and milk products that are fermented.  Wash your hands well after each time you have watery poop.  Only take medicine as told by your doctor.  Take a warm bath to help lessen burning or pain from having watery poop. GET HELP RIGHT AWAY IF:   You cannot drink fluids without throwing up  (vomiting).  You keep throwing up.  You have blood in your poop, or your poop looks black and tarry.  You do not pee (urinate) in 6-8 hours, or there is only a small amount of very dark pee.  You have belly (abdominal) pain that gets worse or stays in the same spot (localizes).  You are weak, dizzy, confused, or lightheaded.  You have a very bad headache.  Your watery poop gets worse or does not get better.  You have a fever or lasting symptoms for more than 2-3 days.  You have a fever and your symptoms suddenly get worse. MAKE SURE YOU:   Understand these instructions.  Will watch your condition.  Will get help right away if you are not doing well or get worse. Document Released: 08/26/2007 Document Revised: 12/02/2011 Document Reviewed: 11/15/2011 [facility] Patient Information 2015 [place], [place]. This information is not intended to replace advice given to you by your health care provider. Make sure you discuss any questions you have with your health care provider. Food Choices to Help Relieve Diarrhea When you have diarrhea, the foods you eat and your eating habits are very important. Choosing the right foods and drinks can help relieve diarrhea. Also, because diarrhea can last up to 7 days, you need to replace lost fluids and electrolytes (such as sodium, potassium, and chloride) in order to help prevent dehydration.  WHAT GENERAL GUIDELINES DO I NEED TO FOLLOW?  Slowly drink 1 cup (8 oz) of fluid for each episode of diarrhea. If you are getting enough  fluid, your urine will be clear or pale yellow.  Eat starchy foods. Some good choices include white rice, white toast, pasta, low-fiber cereal, baked potatoes (without the skin), saltine crackers, and bagels.  Avoid large servings of any cooked vegetables.  Limit fruit to two servings per day. A serving is  cup or 1 small piece.  Choose foods with less than 2 g of fiber per serving.  Limit fats to less than 8 tsp (38 g)  per day.  Avoid fried foods.  Eat foods that have probiotics in them. Probiotics can be found in certain dairy products.  Avoid foods and beverages that may increase the speed at which food moves through the stomach and intestines (gastrointestinal tract). Things to avoid include:  High-fiber foods, such as dried fruit, raw fruits and vegetables, nuts, seeds, and whole grain foods.  Spicy foods and high-fat foods.  Foods and beverages sweetened with high-fructose corn syrup, honey, or sugar alcohols such as xylitol, sorbitol, and mannitol. WHAT FOODS ARE RECOMMENDED? Grains White rice. White, [place], or pita breads (fresh or toasted), including plain rolls, buns, or bagels. White pasta. Saltine, soda, or graham crackers. Pretzels. Low-fiber cereal. Cooked cereals made with water (such as cornmeal, farina, or cream cereals). Plain muffins. Matzo. Melba toast. Zwieback.  Vegetables Potatoes (without the skin). Strained tomato and vegetable juices. Most well-cooked and canned vegetables without seeds. Tender lettuce. Fruits Cooked or canned applesauce, apricots, cherries, fruit cocktail, grapefruit, peaches, pears, or plums. Fresh bananas, apples without skin, cherries, grapes, cantaloupe, grapefruit, peaches, oranges, or plums.  Meat and Other Protein Products Baked or boiled chicken. Eggs. Tofu. Fish. Seafood. Smooth peanut butter. Ground or well-cooked tender beef, ham, veal, lamb, pork, or poultry.  Dairy Plain yogurt, kefir, and unsweetened liquid yogurt. Lactose-free milk, buttermilk, or soy milk. Plain hard cheese. Beverages Sport drinks. Clear broths. Diluted fruit juices (except prune). Regular, caffeine-free sodas such as ginger ale. Water. Decaffeinated teas. Oral rehydration solutions. Sugar-free beverages not sweetened with sugar alcohols. Other Bouillon, broth, or soups made from recommended foods.  The items listed above may not be a complete list of recommended foods or  beverages. Contact your dietitian for more options. WHAT FOODS ARE NOT RECOMMENDED? Grains Whole grain, whole wheat, bran, or rye breads, rolls, pastas, crackers, and cereals. Wild or brown rice. Cereals that contain more than 2 g of fiber per serving. Corn tortillas or taco shells. Cooked or dry oatmeal. Granola. Popcorn. Vegetables Raw vegetables. Cabbage, broccoli, Brussels sprouts, artichokes, baked beans, beet greens, corn, kale, legumes, peas, sweet potatoes, and yams. Potato skins. Cooked spinach and cabbage. Fruits Dried fruit, including raisins and dates. Raw fruits. Stewed or dried prunes. Fresh apples with skin, apricots, mangoes, pears, raspberries, and strawberries.  Meat and Other Protein Products Chunky peanut butter. Nuts and seeds. Beans and lentils. Berniece Salines.  Dairy High-fat cheeses. Milk, chocolate milk, and beverages made with milk, such as milk shakes. Cream. Ice cream. Sweets and Desserts Sweet rolls, doughnuts, and sweet breads. Pancakes and waffles. Fats and Oils Butter. Cream sauces. Margarine. Salad oils. Plain salad dressings. Olives. Avocados.  Beverages Caffeinated beverages (such as coffee, tea, soda, or energy drinks). Alcoholic beverages. Fruit juices with pulp. Prune juice. Soft drinks sweetened with high-fructose corn syrup or sugar alcohols. Other Coconut. Hot sauce. Chili powder. Mayonnaise. Gravy. Cream-based or milk-based soups.  The items listed above may not be a complete list of foods and beverages to avoid. Contact your dietitian for more information. WHAT SHOULD I DO IF I  BECOME DEHYDRATED? Diarrhea can sometimes lead to dehydration. Signs of dehydration include dark urine and dry mouth and skin. If you think you are dehydrated, you should rehydrate with an oral rehydration solution. These solutions can be purchased at pharmacies, retail stores, or online.  Drink -1 cup (120-240 mL) of oral rehydration solution each time you have an episode of diarrhea.  If drinking this amount makes your diarrhea worse, try drinking smaller amounts more often. For example, drink 1-3 tsp (5-15 mL) every 5-10 minutes.  A general rule for staying hydrated is to drink 1-2 L of fluid per day. Talk to your health care provider about the specific amount you should be drinking each day. Drink enough fluids to keep your urine clear or pale yellow. Document Released: 05/30/2003 Document Revised: 03/14/2013 Document Reviewed: 01/30/2013 [facility] Patient Information 2015 [place], [place]. This information is not intended to replace advice given to you by your health care provider. Make sure you discuss any questions you have with your health care provider.

## 2013-10-05 NOTE — Progress Notes (Signed)
Subjective:    Patient ID: Willie Lucas, male    DOB: December 01, 1965, 48 y.o.   MRN: [id number]  Diarrhea  This is a new problem. The current episode started in the past 7 days (5 days). The problem occurs 2 to 4 times per day. The problem has been unchanged. The stool consistency is described as mucous and watery. The patient states that diarrhea does not awaken him from sleep. Associated symptoms include abdominal pain (cramping), bloating, increased flatus and sweats. Pertinent negatives include no arthralgias, chills, coughing, fever, headaches, myalgias, URI, vomiting or weight loss. Nothing aggravates the symptoms. There are no known risk factors. He has tried electrolyte solution and increased fluids (pepto bismol) for the symptoms. The treatment provided mild relief. His past medical history is significant for irritable bowel syndrome (some history in the past, stopped when he DC'd use of creatine for weight lifting.). There is no history of bowel resection, inflammatory bowel disease, malabsorption, a recent abdominal surgery or short gut syndrome.      Review of Systems  Constitutional: Negative for fever, chills and weight loss.  Respiratory: Negative for cough and shortness of breath.   Cardiovascular: Negative for chest pain.  Gastrointestinal: Positive for nausea, abdominal pain (cramping), diarrhea, bloating and flatus. Negative for vomiting, blood in stool, abdominal distention and anal bleeding.  Musculoskeletal: Negative for arthralgias and myalgias.  Neurological: Negative for syncope and headaches.  All other systems reviewed and are negative.  Past Medical History  Diagnosis Date  . Headache(784.0)   . MS (multiple sclerosis)   . Neuromuscular disorder   . Ulcer     hx of duodenal ulcer at age 38    History   Social History  . Marital Status: Married    Spouse Name: N/A    Number of Children: N/A  . Years of Education: N/A   Occupational History  . Technical brewer    Social History Main Topics  . Smoking status: Former Research scientist (life sciences)  . Smokeless tobacco: Never Used  . Alcohol Use: 0.0 oz/week    2-3 Glasses of wine per week  . Drug Use: No  . Sexual Activity: Not on file   Other Topics Concern  . Not on file   Social History Narrative  . No narrative on file    Past Surgical History  Procedure Laterality Date  . Rhinoplasty      X2    Family History  Problem Relation Age of Onset  . Diabetes Mother   . Brain cancer Brother     died  . Arthritis Mother     No Known Allergies  Current Outpatient Prescriptions on File Prior to Visit  Medication Sig Dispense Refill  . clonazePAM (KLONOPIN) 0.5 MG tablet take 1 tablet by mouth twice a day if needed for anxiety  60 tablet  5  . glatiramer (COPAXONE) 20 MG/ML injection Inject 20 mg into the skin once a week.       . hyoscyamine (LEVSIN SL) 0.125 MG SL tablet 1-2 tablets by mouth every 4 hours as needed  60 tablet  0  . ibuprofen (ADVIL,MOTRIN) 800 MG tablet Take 800 mg by mouth every 8 (eight) hours as needed. Pain      . naproxen (NAPROSYN) 375 MG tablet Take 1 tablet (375 mg total) by mouth 2 (two) times daily.  20 tablet  0  . omeprazole (PRILOSEC) 40 MG capsule Take 1 capsule (40 mg total) by mouth daily.  30 capsule  3  .  PARoxetine (PAXIL) 20 MG tablet Take 1 tablet (20 mg total) by mouth every morning.  30 tablet  3  . rizatriptan (MAXALT) 5 MG tablet Take 5 mg by mouth as needed. May repeat in 2 hours if needed migraine      . simvastatin (ZOCOR) 20 MG tablet Take 1 tablet (20 mg total) by mouth daily.  30 tablet  3   No current facility-administered medications on file prior to visit.    EXAM: BP 104/70  Pulse 72  Temp(Src) 98.6 F (37 C) (Oral)  Resp 18  Wt 199 lb (90.266 kg)     Objective:   Physical Exam  Nursing note and vitals reviewed. Constitutional: He is oriented to person, place, and time. He appears well-developed and well-nourished. No distress.  HENT:   Head: Normocephalic and atraumatic.  Eyes: Conjunctivae and EOM are normal. Pupils are equal, round, and reactive to light.  Neck: Normal range of motion.  Cardiovascular: Normal rate, regular rhythm and intact distal pulses.   Pulmonary/Chest: Effort normal and breath sounds normal. No respiratory distress. He exhibits no tenderness.  Abdominal: Soft. Bowel sounds are normal. He exhibits no distension and no mass. There is no tenderness. There is no rebound and no guarding.  Musculoskeletal: Normal range of motion.  Neurological: He is alert and oriented to person, place, and time.  Skin: Skin is warm and dry. No rash noted. He is not diaphoretic. No erythema. No pallor.  Psychiatric: He has a normal mood and affect. His behavior is normal. Judgment and thought content normal.    Lab Results  Component Value Date   WBC 6.3 02/28/2013   HGB 15.5 02/28/2013   HCT 46.1 02/28/2013   PLT 134.0* 02/28/2013   GLUCOSE 92 02/28/2013   CHOL 182 04/19/2013   TRIG 98.0 04/19/2013   HDL 56.20 04/19/2013   LDLDIRECT 176.8 02/28/2013   LDLCALC 106* 04/19/2013   ALT 24 04/19/2013   AST 27 04/19/2013   NA 135 02/28/2013   K 4.4 02/28/2013   CL 102 02/28/2013   CREATININE 0.9 02/28/2013   BUN 19 02/28/2013   CO2 26 02/28/2013   TSH 1.11 02/28/2013         Assessment & Plan:  Willie Lucas was seen today for diarrhea.  Diagnoses and associated orders for this visit:  Diarrhea Comments: Probably viral, will have pt maintain hydration, use immodium, watchful waiting.    Pt states he has suffered from IBS in the past with diarrheal symptoms, and appears to have a sensitivity to multiple supplements, foods, etc. Educated pt on the calming benefits associated with increased fiber in diet and fiber supplement. Pt will try to utilize this once his current episode of diarrhea finishes.  Encouraged pt to use imodium to help lessen diarrheal symptoms, and maintain hydration.  Return precautions provided, and patient  handout on Diarrhea and diet for diarrhea.  Plan to follow up as needed, or for worsening or persistent symptoms despite treatment.  Patient Instructions  Try some imodium for the diarrhea.  Stay hydrated, continue gatorade.  If emergency symptoms discussed during visit developed, seek medical attention immediately.  Followup as needed, or for worsening or persistent symptoms despite treatment.

## 2013-10-17 ENCOUNTER — Other Ambulatory Visit: Payer: Self-pay | Admitting: Family

## 2014-04-02 ENCOUNTER — Other Ambulatory Visit: Payer: Self-pay | Admitting: Family

## 2014-04-03 ENCOUNTER — Telehealth: Payer: Self-pay | Admitting: Internal Medicine

## 2014-04-03 NOTE — Telephone Encounter (Signed)
Pt is completely out of clonazePAM (KLONOPIN) 0.5 MG tablet.  Pt last seen 12/14.  Can you send in 30 days until 2/2 so he can be seen? I can make this a 30 min if I need to. [facility]

## 2014-04-04 MED ORDER — CLONAZEPAM 0.5 MG PO TABS: ORAL_TABLET | ORAL | Status: DC

## 2014-04-04 NOTE — Telephone Encounter (Signed)
Clonazepam #[id number] phoned in. No further refills will be authorized by [facility] without pt being seen in the office

## 2014-04-06 ENCOUNTER — Other Ambulatory Visit: Payer: Self-pay | Admitting: *Deleted

## 2014-04-06 MED ORDER — SIMVASTATIN 20 MG PO TABS: 20.0000 mg | ORAL_TABLET | Freq: Every day | ORAL | Status: DC

## 2014-04-24 ENCOUNTER — Ambulatory Visit (INDEPENDENT_AMBULATORY_CARE_PROVIDER_SITE_OTHER): Payer: BLUE CROSS/BLUE SHIELD | Admitting: Family

## 2014-04-24 ENCOUNTER — Encounter: Payer: Self-pay | Admitting: Family

## 2014-04-24 VITALS — BP 100/80 | Temp 98.4°F | Wt 189.9 lb

## 2014-04-24 DIAGNOSIS — G43009 Migraine without aura, not intractable, without status migrainosus: Secondary | ICD-10-CM

## 2014-04-24 DIAGNOSIS — E7800 Pure hypercholesterolemia, unspecified: Secondary | ICD-10-CM

## 2014-04-24 DIAGNOSIS — J45990 Exercise induced bronchospasm: Secondary | ICD-10-CM

## 2014-04-24 DIAGNOSIS — F411 Generalized anxiety disorder: Secondary | ICD-10-CM

## 2014-04-24 DIAGNOSIS — K219 Gastro-esophageal reflux disease without esophagitis: Secondary | ICD-10-CM

## 2014-04-24 LAB — LIPID PANEL
Cholesterol: 223 mg/dL — ABNORMAL HIGH (ref 0–200)
HDL: 57.6 mg/dL (ref >39.00)
LDL Cholesterol: 131 mg/dL — ABNORMAL HIGH (ref 0–99)
NonHDL: 165.4
Total CHOL/HDL Ratio: 4
Triglycerides: 171 mg/dL — ABNORMAL HIGH (ref 0.0–149.0)
VLDL: 34.2 mg/dL (ref 0.0–40.0)

## 2014-04-24 MED ORDER — CLONAZEPAM 0.5 MG PO TABS: ORAL_TABLET | ORAL | Status: DC

## 2014-04-24 MED ORDER — SIMVASTATIN 20 MG PO TABS: 20.0000 mg | ORAL_TABLET | Freq: Every day | ORAL | Status: DC

## 2014-04-24 MED ORDER — ALBUTEROL SULFATE HFA 108 (90 BASE) MCG/ACT IN AERS: 1.0000 | INHALATION_SPRAY | Freq: Four times a day (QID) | RESPIRATORY_TRACT | Status: DC | PRN

## 2014-04-24 MED ORDER — RIZATRIPTAN BENZOATE 5 MG PO TABS: 5.0000 mg | ORAL_TABLET | ORAL | Status: DC | PRN

## 2014-04-24 NOTE — Patient Instructions (Signed)
Fat and Cholesterol Control Diet Fat and cholesterol levels in your blood and organs are influenced by your diet. High levels of fat and cholesterol may lead to diseases of the heart, small and large blood vessels, gallbladder, liver, and pancreas. CONTROLLING FAT AND CHOLESTEROL WITH DIET Although exercise and lifestyle factors are important, your diet is key. That is because certain foods are known to raise cholesterol and others to lower it. The goal is to balance foods for their effect on cholesterol and more importantly, to replace saturated and trans fat with other types of fat, such as monounsaturated fat, polyunsaturated fat, and omega-3 fatty acids. On average, a person should consume no more than 15 to 17 g of saturated fat daily. Saturated and trans fats are considered "bad" fats, and they will raise LDL cholesterol. Saturated fats are primarily found in animal products such as meats, butter, and cream. However, that does not mean you need to give up all your favorite foods. Today, there are good tasting, low-fat, low-cholesterol substitutes for most of the things you like to eat. Choose low-fat or nonfat alternatives. Choose round or loin cuts of red meat. These types of cuts are lowest in fat and cholesterol. Chicken (without the skin), fish, veal, and ground turkey breast are great choices. Eliminate fatty meats, such as hot dogs and salami. Even shellfish have little or no saturated fat. Have a 3 oz (85 g) portion when you eat lean meat, poultry, or fish. Trans fats are also called "partially hydrogenated oils." They are oils that have been scientifically manipulated so that they are solid at room temperature resulting in a longer shelf life and improved taste and texture of foods in which they are added. Trans fats are found in stick margarine, some tub margarines, cookies, crackers, and baked goods.  When baking and cooking, oils are a great substitute for butter. The monounsaturated oils are  especially beneficial since it is believed they lower LDL and raise HDL. The oils you should avoid entirely are saturated tropical oils, such as coconut and palm.  Remember to eat a lot from food groups that are naturally free of saturated and trans fat, including fish, fruit, vegetables, beans, grains (barley, rice, couscous, bulgur wheat), and pasta (without cream sauces).  IDENTIFYING FOODS THAT LOWER FAT AND CHOLESTEROL  Soluble fiber may lower your cholesterol. This type of fiber is found in fruits such as apples, vegetables such as broccoli, potatoes, and carrots, legumes such as beans, peas, and lentils, and grains such as barley. Foods fortified with plant sterols (phytosterol) may also lower cholesterol. You should eat at least 2 g per day of these foods for a cholesterol lowering effect.  Read package labels to identify low-saturated fats, trans fat free, and low-fat foods at the supermarket. Select cheeses that have only 2 to 3 g saturated fat per ounce. Use a heart-healthy tub margarine that is free of trans fats or partially hydrogenated oil. When buying baked goods (cookies, crackers), avoid partially hydrogenated oils. Breads and muffins should be made from whole grains (whole-wheat or whole oat flour, instead of "flour" or "enriched flour"). Buy non-creamy canned soups with reduced salt and no added fats.  FOOD PREPARATION TECHNIQUES  Never deep-fry. If you must fry, either stir-fry, which uses very little fat, or use non-stick cooking sprays. When possible, broil, bake, or roast meats, and steam vegetables. Instead of putting butter or margarine on vegetables, use lemon and herbs, applesauce, and cinnamon (for squash and sweet potatoes). Use nonfat   yogurt, salsa, and low-fat dressings for salads.  LOW-SATURATED FAT / LOW-FAT FOOD SUBSTITUTES Meats / Saturated Fat (g)  Avoid: Steak, marbled (3 oz/85 g) / 11 g  Choose: Steak, lean (3 oz/85 g) / 4 g  Avoid: Hamburger (3 oz/85 g) / 7  g  Choose: Hamburger, lean (3 oz/85 g) / 5 g  Avoid: Ham (3 oz/85 g) / 6 g  Choose: Ham, lean cut (3 oz/85 g) / 2.4 g  Avoid: Chicken, with skin, dark meat (3 oz/85 g) / 4 g  Choose: Chicken, skin removed, dark meat (3 oz/85 g) / 2 g  Avoid: Chicken, with skin, light meat (3 oz/85 g) / 2.5 g  Choose: Chicken, skin removed, light meat (3 oz/85 g) / 1 g Dairy / Saturated Fat (g)  Avoid: Whole milk (1 cup) / 5 g  Choose: Low-fat milk, 2% (1 cup) / 3 g  Choose: Low-fat milk, 1% (1 cup) / 1.5 g  Choose: Skim milk (1 cup) / 0.3 g  Avoid: Hard cheese (1 oz/28 g) / 6 g  Choose: Skim milk cheese (1 oz/28 g) / 2 to 3 g  Avoid: Cottage cheese, 4% fat (1 cup) / 6.5 g  Choose: Low-fat cottage cheese, 1% fat (1 cup) / 1.5 g  Avoid: Ice cream (1 cup) / 9 g  Choose: Sherbet (1 cup) / 2.5 g  Choose: Nonfat frozen yogurt (1 cup) / 0.3 g  Choose: Frozen fruit bar / trace  Avoid: Whipped cream (1 tbs) / 3.5 g  Choose: Nondairy whipped topping (1 tbs) / 1 g Condiments / Saturated Fat (g)  Avoid: Mayonnaise (1 tbs) / 2 g  Choose: Low-fat mayonnaise (1 tbs) / 1 g  Avoid: Butter (1 tbs) / 7 g  Choose: Extra light margarine (1 tbs) / 1 g  Avoid: Coconut oil (1 tbs) / 11.8 g  Choose: Olive oil (1 tbs) / 1.8 g  Choose: Corn oil (1 tbs) / 1.7 g  Choose: Safflower oil (1 tbs) / 1.2 g  Choose: Sunflower oil (1 tbs) / 1.4 g  Choose: Soybean oil (1 tbs) / 2.4 g  Choose: Canola oil (1 tbs) / 1 g Document Released: 03/09/2005 Document Revised: 07/04/2012 Document Reviewed: 06/07/2013 [employer] Patient Information 2015 [employer]. This information is not intended to replace advice given to you by your health care provider. Make sure you discuss any questions you have with your health care provider.  

## 2014-04-24 NOTE — Progress Notes (Signed)
Pre visit review using our clinic review tool, if applicable. No additional management support is needed unless otherwise documented below in the visit note. 

## 2014-04-24 NOTE — Progress Notes (Signed)
Subjective:    Patient ID: Willie Lucas, male    DOB: 19-Sep-1965, 49 y.o.   MRN: [id number]  HPI 49 year old white male, nonsmoker with a history of anxiety, migraine headaches, hypercholesterolemia is in today for recheck. Reports he's been exercising more the last 6 months doing boot camp training. He works out outside. Reports feeling more winded at times. Therefore, he borrowed an inhaler and took one puff before exercise that helped significantly. His requesting a refill of albuterol. Reports a history of allergic rhinitis to grass. Not currently taking cholesterol medication x 1 month.   Review of Systems  Constitutional: Negative.   HENT: Negative.   Respiratory: Positive for shortness of breath. Negative for cough, wheezing and stridor.   Cardiovascular: Negative.   Gastrointestinal: Negative.   Endocrine: Negative.   Genitourinary: Negative.   Musculoskeletal: Negative.   Allergic/Immunologic: Negative.   Neurological: Negative.   Psychiatric/Behavioral: Negative.    Past Medical History  Diagnosis Date  . Headache(784.0)   . MS (multiple sclerosis)   . Neuromuscular disorder   . Ulcer     hx of duodenal ulcer at age 48    History   Social History  . Marital Status: Married    Spouse Name: N/A    Number of Children: N/A  . Years of Education: N/A   Occupational History  . Music therapist    Social History Main Topics  . Smoking status: Former Research scientist (life sciences)  . Smokeless tobacco: Never Used  . Alcohol Use: 0.0 oz/week    2-3 Glasses of wine per week  . Drug Use: No  . Sexual Activity: Not on file   Other Topics Concern  . Not on file   Social History Narrative    Past Surgical History  Procedure Laterality Date  . Rhinoplasty      X2    Family History  Problem Relation Age of Onset  . Diabetes Mother   . Brain cancer Brother     died  . Arthritis Mother     No Known Allergies  Current Outpatient Prescriptions on File Prior to Visit    Medication Sig Dispense Refill  . ibuprofen (ADVIL,MOTRIN) 800 MG tablet Take 800 mg by mouth every 8 (eight) hours as needed. Pain    . omeprazole (PRILOSEC) 40 MG capsule Take 1 capsule (40 mg total) by mouth daily. 30 capsule 3  . glatiramer (COPAXONE) 20 MG/ML injection Inject 20 mg into the skin once a week.      No current facility-administered medications on file prior to visit.    BP 100/80 mmHg  Temp(Src) 98.4 F (36.9 C) (Oral)  Wt 189 lb 14.4 oz (86.138 kg)chart    Objective:   Physical Exam  Constitutional: He is oriented to person, place, and time. He appears well-developed and well-nourished.  HENT:  Right Ear: External ear normal.  Left Ear: External ear normal.  Nose: Nose normal.  Mouth/Throat: Oropharynx is clear and moist.  Neck: Normal range of motion. Neck supple.  Cardiovascular: Normal rate, normal heart sounds and intact distal pulses.   Pulmonary/Chest: Effort normal and breath sounds normal.  Abdominal: Soft. Bowel sounds are normal.  Musculoskeletal: Normal range of motion.  Neurological: He is alert and oriented to person, place, and time.  Skin: Skin is warm and dry.  Psychiatric: He has a normal mood and affect.          Assessment & Plan:  Malachi was seen today for follow-up.  Diagnoses and associated orders  for this visit:  Pure hypercholesterolemia - Lipid Panel  Generalized anxiety disorder  Gastroesophageal reflux disease without esophagitis  Migraine without aura and without status migrainosus, not intractable  Exercise-induced reactive airway disease  Other Orders - simvastatin (ZOCOR) 20 MG tablet; Take 1 tablet (20 mg total) by mouth daily. - rizatriptan (MAXALT) 5 MG tablet; Take 1 tablet (5 mg total) by mouth as needed. May repeat in 2 hours if needed migraine - clonazePAM (KLONOPIN) 0.5 MG tablet; take 1 tablet by mouth twice a day if needed for anxiety - albuterol (PROVENTIL HFA;VENTOLIN HFA) 108 (90 BASE) MCG/ACT  inhaler; Inhale 1 puff into the lungs every 6 (six) hours as needed for wheezing or shortness of breath.    Continue current medications. Albuterol as needed. Call the office with any questions or concerns. Recheck in 6 months.

## 2014-04-25 ENCOUNTER — Other Ambulatory Visit: Payer: Self-pay | Admitting: Family

## 2014-04-25 DIAGNOSIS — E7800 Pure hypercholesterolemia, unspecified: Secondary | ICD-10-CM

## 2014-05-07 ENCOUNTER — Ambulatory Visit (INDEPENDENT_AMBULATORY_CARE_PROVIDER_SITE_OTHER): Payer: BLUE CROSS/BLUE SHIELD | Admitting: Internal Medicine

## 2014-05-07 VITALS — BP 122/60 | HR 67 | Temp 97.8°F | Resp 18 | Ht 71.0 in | Wt 192.8 lb

## 2014-05-07 DIAGNOSIS — R05 Cough: Secondary | ICD-10-CM

## 2014-05-07 DIAGNOSIS — R52 Pain, unspecified: Secondary | ICD-10-CM

## 2014-05-07 DIAGNOSIS — R509 Fever, unspecified: Secondary | ICD-10-CM

## 2014-05-07 DIAGNOSIS — R0981 Nasal congestion: Secondary | ICD-10-CM

## 2014-05-07 DIAGNOSIS — R059 Cough, unspecified: Secondary | ICD-10-CM

## 2014-05-07 LAB — POCT INFLUENZA A/B
Influenza A, POC: NEGATIVE
Influenza B, POC: NEGATIVE

## 2014-05-07 MED ORDER — HYDROCODONE-ACETAMINOPHEN 7.5-325 MG/15ML PO SOLN: 5.0000 mL | Freq: Four times a day (QID) | ORAL | Status: DC | PRN

## 2014-05-07 MED ORDER — AZITHROMYCIN 500 MG PO TABS: 500.0000 mg | ORAL_TABLET | Freq: Every day | ORAL | Status: DC

## 2014-05-07 NOTE — Patient Instructions (Signed)

## 2014-05-07 NOTE — Progress Notes (Signed)
   Subjective:    Patient ID: Willie Lucas, male    DOB: 05-30-1965, 49 y.o.   MRN: [id number]  HPI 2days progressive body aches, cough, sweats. No sob,cp. Has inactive MS. Just returned from hiking in [place], many coughing on th eplane.   Review of Systems     Objective:   Physical Exam  Constitutional: He is oriented to person, place, and time. He appears well-developed and well-nourished. No distress.  HENT:  Head: Normocephalic.  Right Ear: External ear normal.  Left Ear: External ear normal.  Nose: Mucosal edema, rhinorrhea and sinus tenderness present.  Mouth/Throat: Oropharynx is clear and moist.  Eyes: EOM are normal. Pupils are equal, round, and reactive to light.  Neck: Normal range of motion.  Cardiovascular: Normal rate.   Pulmonary/Chest: Effort normal. No tachypnea. No respiratory distress. He has no decreased breath sounds. He has no wheezes. He has rhonchi. He has no rales. He exhibits tenderness.  Musculoskeletal: Normal range of motion.  Lymphadenopathy:    He has no cervical adenopathy.  Neurological: He is alert and oriented to person, place, and time. He exhibits normal muscle tone. Coordination normal.  Skin: No rash noted.  Psychiatric: He has a normal mood and affect.     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

## 2014-07-04 ENCOUNTER — Ambulatory Visit (INDEPENDENT_AMBULATORY_CARE_PROVIDER_SITE_OTHER): Payer: BLUE CROSS/BLUE SHIELD | Admitting: Neurology

## 2014-07-04 ENCOUNTER — Encounter: Payer: Self-pay | Admitting: Neurology

## 2014-07-04 ENCOUNTER — Other Ambulatory Visit: Payer: Self-pay | Admitting: Neurology

## 2014-07-04 VITALS — BP 126/90 | HR 60 | Resp 14 | Ht 70.0 in | Wt 193.4 lb

## 2014-07-04 DIAGNOSIS — G43009 Migraine without aura, not intractable, without status migrainosus: Secondary | ICD-10-CM | POA: Diagnosis not present

## 2014-07-04 DIAGNOSIS — M62838 Other muscle spasm: Secondary | ICD-10-CM | POA: Diagnosis not present

## 2014-07-04 DIAGNOSIS — G35 Multiple sclerosis: Secondary | ICD-10-CM | POA: Diagnosis not present

## 2014-07-04 DIAGNOSIS — M5416 Radiculopathy, lumbar region: Secondary | ICD-10-CM

## 2014-07-04 MED ORDER — CLONAZEPAM 0.5 MG PO TABS: ORAL_TABLET | ORAL | Status: DC

## 2014-07-04 MED ORDER — RIZATRIPTAN BENZOATE 5 MG PO TABS: 5.0000 mg | ORAL_TABLET | ORAL | Status: DC | PRN

## 2014-07-04 NOTE — Progress Notes (Signed)
[facility]  PATIENT: Willie Lucas DOB: 08/05/1965  REFERRING DOCTOR OR PCP:  Benay Pillow SOURCE: Patient and records from [facility]  _________________________________   HISTORICAL  CHIEF COMPLAINT:  Chief Complaint  Patient presents with  . Multiple Sclerosis    He is not currently on any MS med.  Sts. no new or worsening MS sx.  Today he is here with c/o nonradiating lbp onset this am with no known injury.  Hx. of similar pain,  with relief from both esi's and tpi's./fim  . Back Pain    He has tried Baclofen in the past but sts. it wan't very effective--would like to discuss a stronger muscle relaxer/fim    HISTORY OF PRESENT ILLNESS:  Willie Lucas is a 49 year old man who was diagnosed with multiple sclerosis around 2001 who also has migraine headaches and lumbar disc degenerative changes causing back pain and radiculopathy.  Low back and leg pain:  He felt fine when he woke up this morning.   He turned to grab a cup of coffee and had sudden sharp jab sensation in his back.   He continues to have pain in the back and a sensation of muscle tightness.   There is an uncomfortable sensation going into the back of both proximal legs. Pain is very similar to pain in 2014 that responded to epidural steroid injection and to pain a few years earlier that also responded to epidural steroid injection.  Baclofen had not helped his muscle spasms in the past.    He has not tried other muscle relaxants.     He had a mild vasovagal reaction with sweats but did not get lightheaded with ESI in the past. He denies any weakness in the legs but when the pain intensifies he feels the leg gives out.  There is no numbness. There is no change in bowel or bladder.   I personally reviewed his MRI of the lumbar spine dated 08/01/2009. There are disc degenerative changes at L2-L3, L4-L5 and L5-S1. At L4-L5, there is a left paramedian disc protrusion with some encroachment on the  left L5 nerve root. At L5-S1 right paramedian disc protrusion with some encroachment on the S1 nerve root.  Migraine headaches: Migraines are doing worse the past year with increased frequency. However, since starting clonazepam they are now only occurring once a month.   When the pain is present it is throbbing, usually unilateral and over the eye. He gets photophobia and phonophobia and moving makes the pain worse. He gets some nausea but usually does not have vomiting.  Currently, he has one bad migraine a month.   He takes sublingual Maxalt 10 mg MLT with ibuprofen and is better within 30 minutes.    They were occurring 3-4 times a month before clonazepam.      Mood:   Anxiety is helped by clonazepam and he takes one every morning and a second one many afternoons      Multiple sclerosis: I see Willie Lucas since couple years after his diagnosis of multiple sclerosis. He has done very well. Initially he had several exacerbations with numbness and clumsiness and visual changes.[name] He placed on Copaxone and did not have any further exacerbations. About 5 or 6 years ago he went to every other day and year or 2 later he stopped the Copaxone. He has continued to do well with no definite exacerbations the last 5 years. An MRI was performed 09/08/2012.  It was compared to one dated  01/31/2008 andthere was only one new lesion in that 5 year interval.  There were no acute lesions on either study.  After his diagnosis he began to exercise on a very regular basis and continues to do so. He denies any significant sequela from the MS. Specifically, there is no difficulty with his gait and he notes no numbness or weakness. He does not have bladder or visual dysfunction. His mood issues with anxiety are probably unrelated to the MS. He does not note much for fatigue and remains active and travels frequently.   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  REVIEW OF SYSTEMS: Constitutional: No  fevers, chills, sweats, or change in appetite Eyes: No visual changes, double vision, eye pain Ear, nose and throat: No hearing loss, ear pain, nasal congestion, sore throat Cardiovascular: No chest pain, palpitations Respiratory: No shortness of breath at rest or with exertion.   No wheezes GastrointestinaI: No nausea, vomiting, diarrhea, abdominal pain, fecal incontinence Genitourinary: No dysuria, urinary retention or frequency.  No nocturia. Musculoskeletal: See above  Integumentary: No rash, pruritus, skin lesions Neurological: as above Psychiatric: No depression at this time.  Notes anxiety Endocrine: No palpitations, diaphoresis, change in appetite, change in weigh or increased thirst Hematologic/Lymphatic: No anemia, purpura, petechiae. Allergic/Immunologic: No itchy/runny eyes, nasal congestion, recent allergic reactions, rashes  ALLERGIES: No Known Allergies  HOME MEDICATIONS:  Current outpatient prescriptions:  .  albuterol (PROVENTIL HFA;VENTOLIN HFA) 108 (90 BASE) MCG/ACT inhaler, Inhale 1 puff into the lungs every 6 (six) hours as needed for wheezing or shortness of breath., Disp: 1 Inhaler, Rfl: 2 .  clonazePAM (KLONOPIN) 0.5 MG tablet, take 1 tablet by mouth twice a day if needed for anxiety, Disp: 60 tablet, Rfl: 3 .  ibuprofen (ADVIL,MOTRIN) 800 MG tablet, Take 800 mg by mouth every 8 (eight) hours as needed. Pain, Disp: , Rfl:  .  omeprazole (PRILOSEC) 40 MG capsule, Take 1 capsule (40 mg total) by mouth daily., Disp: 30 capsule, Rfl: 3 .  rizatriptan (MAXALT) 5 MG tablet, Take 1 tablet (5 mg total) by mouth as needed. May repeat in 2 hours if needed migraine, Disp: 10 tablet, Rfl: 0 .  simvastatin (ZOCOR) 20 MG tablet, Take 1 tablet (20 mg total) by mouth daily. (Patient not taking: Reported on 07/04/2014), Disp: 30 tablet, Rfl: 5  PAST MEDICAL HISTORY: Past Medical History  Diagnosis Date  . Headache(784.0)   . MS (multiple sclerosis)   . Neuromuscular  disorder   . Ulcer     hx of duodenal ulcer at age 80  . Anxiety     PAST SURGICAL HISTORY: Past Surgical History  Procedure Laterality Date  . Rhinoplasty      X2    FAMILY HISTORY: Family History  Problem Relation Age of Onset  . Diabetes Mother   . Arthritis Mother   . Brain cancer Brother     died    SOCIAL HISTORY:  History   Social History  . Marital Status: Married    Spouse Name: N/A  . Number of Children: N/A  . Years of Education: N/A   Occupational History  . Music therapist    Social History Main Topics  . Smoking status: Former Research scientist (life sciences)  . Smokeless tobacco: Never Used  . Alcohol Use: 0.0 oz/week    2-3 Glasses of wine per week  . Drug Use: No  . Sexual Activity: Not on file   Other Topics Concern  . Not on file  Social History Narrative     PHYSICAL EXAM  Filed Vitals:   07/04/14 1120  BP: 126/90  Pulse: 60  Resp: 14  Height: 5\' 10"  (1.778 m)  Weight: 193 lb 6.4 oz (87.726 kg)    Body mass index is 27.75 kg/(m^2).   General: The patient is well-developed and well-nourished and in mild acute distress  Neck: The neck is supple, no carotid bruits are noted.  The neck is mildly tender.  Skin:  Extremities are without significant edema.  Musculoskeletal:  Back is tender over lumbar paraspinals from L4-S1. There is no piriformis tenderness. He has mild tenderness between the shoulder blades and in the lower neck  Neurologic Exam  Mental status: The patient is alert and oriented x 3 at the time of the examination. The patient has apparent normal recent and remote memory, with an apparently normal attention span and concentration ability.   Speech is normal.  Cranial nerves: Extraocular movements are full. Pupils are equal, round, and reactive to light and accomodation.  Visual fields are full.  Facial symmetry is present. There is good facial sensation to soft touch bilaterally.Facial strength is normal.  Trapezius and  sternocleidomastoid strength is normal. No dysarthria is noted.  The tongue is midline, and the patient has symmetric elevation of the soft palate. No obvious hearing deficits are noted.  Motor:  Muscle bulk is normal.   Tone is normal. Strength is  5 / 5 in all 4 extremities.   Sensory: Sensory testing is intact to pinprick, soft touch and vibration sensation in all 4 extremities.  Coordination: Cerebellar testing reveals good finger-nose-finger and heel-to-shin bilaterally.  Gait and station: Station is normal.   Gait is normal. Tandem gait is normal. Romberg is negative.   Reflexes: Deep tendon reflexes are symmetric and normal bilaterally.   Plantar responses are flexor.    DIAGNOSTIC DATA (LABS, IMAGING, TESTING) - I reviewed patient records, labs, notes, testing and imaging myself where available.  Lab Results  Component Value Date   WBC 6.3 02/28/2013   HGB 15.5 02/28/2013   HCT 46.1 02/28/2013   MCV 93.9 02/28/2013   PLT 134.0* 02/28/2013      Component Value Date/Time   NA 135 02/28/2013 0857   K 4.4 02/28/2013 0857   CL 102 02/28/2013 0857   CO2 26 02/28/2013 0857   GLUCOSE 92 02/28/2013 0857   BUN 19 02/28/2013 0857   CREATININE 0.9 02/28/2013 0857   CALCIUM 8.8 02/28/2013 0857   PROT 6.9 04/19/2013 0819   ALBUMIN 4.3 04/19/2013 0819   AST 27 04/19/2013 0819   ALT 24 04/19/2013 0819   ALKPHOS 78 04/19/2013 0819   BILITOT 1.0 04/19/2013 0819   GFRNONAA 93.30 09/02/2009 0909   Lab Results  Component Value Date   CHOL 223* 04/24/2014   HDL 57.60 04/24/2014   LDLCALC 131* 04/24/2014   LDLDIRECT 176.8 02/28/2013   TRIG 171.0* 04/24/2014   CHOLHDL 4 04/24/2014   No results found for: HGBA1C No results found for: VITAMINB12 Lab Results  Component Value Date   TSH 1.11 02/28/2013     ________________________________________  PROCEDURE Epidural Steroid Injection at L4L5  The risks, alternatives and benefits of an epidural steroid injection was  discussed with him. The back was prepped and draped. The skin over the L4L5 interspace was numbed with 1% lidocaine. An 18-gauge Touhy needle was slowly advanced. Using the loss of resistance technique with sterile saline, the epidural space was identified.  8 Milligrams (2 Ml)  of Decadron was mixed with 2 mL normal saline and 1 ml 1% lidocaine and was slowly injected. The needle was removed intact. The patient tolerated the procedure well.     He noted improvement of pain about 3-5 minutes after the procedure going from a 9 to a 5/10.     ASSESSMENT AND PLAN  Multiple sclerosis  Lumbar radicular pain  MUSCLE SPASM, LUMBOSACRAL REGION  Common migraine without intractability   In summary, Willie Lucas is a 49 year old man with multiple sclerosis who comes in today with an exacerbation of his lumbar degenerative disease with radiculopathy. A transforaminal ESI was performed at L4-L5 and he reported improved pain afterwards. I will write him for some more clonazepam as a muscle relaxant. He, will continue to exercise as tolerated. His MS is doing very well and we will likely consider getting an MRI later this year. This will allow [place] to assess the possibility of subclinical progression. If this is occurring he will need to get back on a disease modifying therapy. Migraines are generally doing well. I gave him some samples of Relpax and a prescription for generic rizatriptan as needed.     He will return to see me in 6 months or sooner if he has new or worsening neurologic symptoms.   Richard A. Felecia Shelling, MD, PhD 2/83/1517, 61:60 AM Certified in Neurology, Clinical Neurophysiology, Sleep Medicine, Pain Medicine and Neuroimaging  [facility] [address] [place], [place] [address] [phone]

## 2014-07-13 ENCOUNTER — Ambulatory Visit (INDEPENDENT_AMBULATORY_CARE_PROVIDER_SITE_OTHER): Payer: BLUE CROSS/BLUE SHIELD | Admitting: Neurology

## 2014-07-13 ENCOUNTER — Encounter: Payer: Self-pay | Admitting: Neurology

## 2014-07-13 VITALS — BP 144/82 | HR 80 | Resp 14 | Ht 70.0 in | Wt 193.0 lb

## 2014-07-13 DIAGNOSIS — G35 Multiple sclerosis: Secondary | ICD-10-CM | POA: Diagnosis not present

## 2014-07-13 DIAGNOSIS — M62838 Other muscle spasm: Secondary | ICD-10-CM | POA: Diagnosis not present

## 2014-07-13 DIAGNOSIS — M5416 Radiculopathy, lumbar region: Secondary | ICD-10-CM

## 2014-07-13 MED ORDER — ETODOLAC 400 MG PO TABS: 400.0000 mg | ORAL_TABLET | Freq: Two times a day (BID) | ORAL | Status: DC

## 2014-07-13 MED ORDER — OXYCODONE-ACETAMINOPHEN 7.5-325 MG PO TABS: 1.0000 | ORAL_TABLET | Freq: Four times a day (QID) | ORAL | Status: DC | PRN

## 2014-07-13 NOTE — Progress Notes (Signed)
[facility]  PATIENT: Willie Lucas DOB: [birth year]     HISTORICAL  CHIEF COMPLAINT:  Chief Complaint  Patient presents with  . Back Pain    Sts after esi last week he felt much better until this am when he bent over, had sudden onset of lbp radiating down right leg.    HISTORY OF PRESENT ILLNESS:  Willie Lucas is a 49 year old man with multiple sclerosis who also has lumbar radiculopathy. Ten days ago he had an epidural steroid injection. Pain was much better for the next week. Then yesterday he began to experience some back pain that built up overnight. When he woke up this morning he was more stiff having pain in the back. When he bent over he had a sudden surge of extreme pain. Pain is knifelike and in the mid lower lumbar and upper sacral area. He is getting radiation into the right leg when he stands a certain way. There is more pain in the right buttock compared to the left.   Pain is reduced by sitting or leaning forward. Coughing increases the pain.   His right leg seems slightly weak to him. He does not have any fixed numbness in the right foot or leg. He has not noted any bowel or bladder changes.   MRI in the past has shown disc herniation at L5-S1 towards the right with posterior displacement of the right S1 nerve root. There was also protrusion to the left at L4-L5.   ROS:  Out of a complete 14 system review of symptoms, the patient complains only of the following symptoms, and all other reviewed systems are negative.  Back and leg pain as above   ALLERGIES: No Known Allergies  HOME MEDICATIONS:  Current outpatient prescriptions:  .  albuterol (PROVENTIL HFA;VENTOLIN HFA) 108 (90 BASE) MCG/ACT inhaler, Inhale 1 puff into the lungs every 6 (six) hours as needed for wheezing or shortness of breath., Disp: 1 Inhaler, Rfl: 2 .  baclofen (LIORESAL) 10 MG tablet, TAKE 1 TABLET BY MOUTH 3 TIMES DAILY, Disp: 90 tablet, Rfl: 5 .  clonazePAM (KLONOPIN)  0.5 MG tablet, take 1 tablet up to 3/day for back spasticit, Disp: 20 tablet, Rfl: 0 .  ibuprofen (ADVIL,MOTRIN) 800 MG tablet, Take 800 mg by mouth every 8 (eight) hours as needed. Pain, Disp: , Rfl:  .  omeprazole (PRILOSEC) 40 MG capsule, Take 1 capsule (40 mg total) by mouth daily., Disp: 30 capsule, Rfl: 3 .  rizatriptan (MAXALT) 5 MG tablet, Take 1 tablet (5 mg total) by mouth as needed. May repeat in 2 hours if needed migraine, Disp: 10 tablet, Rfl: 0 .  simvastatin (ZOCOR) 20 MG tablet, Take 1 tablet (20 mg total) by mouth daily., Disp: 30 tablet, Rfl: 5  PAST MEDICAL HISTORY: Past Medical History  Diagnosis Date  . Headache(784.0)   . MS (multiple sclerosis)   . Neuromuscular disorder   . Ulcer     hx of duodenal ulcer at age 31  . Anxiety     PAST SURGICAL HISTORY: Past Surgical History  Procedure Laterality Date  . Rhinoplasty      X2    FAMILY HISTORY: Family History  Problem Relation Age of Onset  . Diabetes Mother   . Arthritis Mother   . Brain cancer Brother     died    SOCIAL HISTORY:  History   Social History  . Marital Status: Married    Spouse Name: N/A  . Number of Children: N/A  .  Years of Education: N/A   Occupational History  . Music therapist    Social History Main Topics  . Smoking status: Former Research scientist (life sciences)  . Smokeless tobacco: Never Used  . Alcohol Use: 0.0 oz/week    2-3 Glasses of wine per week  . Drug Use: No  . Sexual Activity: Not on file   Other Topics Concern  . Not on file   Social History Narrative     PHYSICAL EXAM  Filed Vitals:   07/13/14 0850  BP: 144/82  Pulse: 80  Resp: 14  Height: 5\' 10"  (1.778 m)  Weight: 193 lb (87.544 kg)    Body mass index is 27.69 kg/(m^2).   General: The patient is well-developed and well-nourished and in no acute distress  Musculoskeletal:  Back and right piriformis are mildly tender.    + SLR on the right with only a few degrees movement  Neurologic Exam  Mental status:  The patient is alert and oriented x 3 at the time of the examination. The patient has apparent normal recent and remote memory, with an apparently normal attention span and concentration ability.   Speech is normal.  Cranial nerves: Extraocular movements are full.   Facial symmetry is present.Facial strength is normal.   Motor:  Muscle bulk is normal.   Tone is normal. Strength is  5 / 5 in arms and left leg.    4+/5 in right toe extensors.   Sensory: Sensory testing is intact to pinprick, soft touch and vibration sensation in all 4 extremities.  Coordination: Cerebellar testing reveals good finger-nose-finger and heel-to-shin bilaterally.  Gait and station: Station is normal.   Gait is arthritic and he leans forward.   Reflexes: Deep tendon reflexes are symmetric and brisk bilaterally.     DIAGNOSTIC DATA (LABS, IMAGING, TESTING) - I reviewed patient records, labs, notes, testing and imaging myself where available.  Lab Results  Component Value Date   WBC 6.3 02/28/2013   HGB 15.5 02/28/2013   HCT 46.1 02/28/2013   MCV 93.9 02/28/2013   PLT 134.0* 02/28/2013      Component Value Date/Time   NA 135 02/28/2013 0857   K 4.4 02/28/2013 0857   CL 102 02/28/2013 0857   CO2 26 02/28/2013 0857   GLUCOSE 92 02/28/2013 0857   BUN 19 02/28/2013 0857   CREATININE 0.9 02/28/2013 0857   CALCIUM 8.8 02/28/2013 0857   PROT 6.9 04/19/2013 0819   ALBUMIN 4.3 04/19/2013 0819   AST 27 04/19/2013 0819   ALT 24 04/19/2013 0819   ALKPHOS 78 04/19/2013 0819   BILITOT 1.0 04/19/2013 0819   GFRNONAA 93.30 09/02/2009 0909   Lab Results  Component Value Date   CHOL 223* 04/24/2014   HDL 57.60 04/24/2014   LDLCALC 131* 04/24/2014   LDLDIRECT 176.8 02/28/2013   TRIG 171.0* 04/24/2014   CHOLHDL 4 04/24/2014   No results found for: HGBA1C No results found for: VITAMINB12 Lab Results  Component Value Date   TSH 1.11 02/28/2013   _____________________________________  PROCEDURE Epidural  Steroid Injection at L4L5  The risks, alternatives and benefits of an epidural steroid injection was discussed with her. The back was prepped and draped. The skin over the L4L5 interspace was numbed with 1% lidocaine. An 18-gauge Touhy needle was slowly advanced. Using the loss of resistance technique with sterile saline, the epidural space was identified.  160 Milligrams of Depo-Medrol was mixed into 4 ml 0.25% lidocaine and was slowly injected. The needle was removed intact. The  patient tolerated the procedure well.       ASSESSMENT AND PLAN  Lumbar radicular pain - Plan: MR Lumbar Spine Wo Contrast  Multiple sclerosis  MUSCLE SPASM, LUMBOSACRAL REGION - Plan: MR Lumbar Spine Wo Contrast   1.   Suspect right L5 radiculopathy (ankle DTR preserved).    Repeat ESI at L4L5 using sterile technique 2.   MRI of the lumbar spine.   Consider referral to surgery based on results 3.   Etodolac 400 mg po bid and prn percocet.   Can increase clonazepam  If not better next week, consider referral to surgery   Judene Logue A. Felecia Shelling, MD, PhD 3/83/3383, 2:91 AM Certified in Neurology, Clinical Neurophysiology, Sleep Medicine, Pain Medicine and Neuroimaging  [facility] [address], [place], [place] [address] [phone]

## 2014-07-16 ENCOUNTER — Telehealth: Payer: Self-pay | Admitting: Neurology

## 2014-07-16 NOTE — Telephone Encounter (Signed)
Spoke with [place] who sts. he is significantly better since esi was given on Friday--but sts. in the past he has had 100% improvement in the pain.  I advised it has only been 72 hrs. since esi--that numbing med provides initial pain relief, and it take 48-72 hrs. for steroid to kick in really well--that steroid will continue to work to decrease edema over the next 1-2 weeks, so he may continue to see improvement.  He will call me back in one week if pain persists/worsens.   He will proceed with scheduling mri lumbar spine that was ordered at last ov/fim

## 2014-07-16 NOTE — Telephone Encounter (Signed)
Patient stated disk is still bulging/slippped after steroid epidural injection.  Questioning what's next.  Please call and advise.

## 2014-07-18 ENCOUNTER — Telehealth: Payer: Self-pay | Admitting: Neurology

## 2014-07-18 NOTE — Telephone Encounter (Signed)
  I called and got this approved      #[id number]  Willie Lucas

## 2014-07-18 NOTE — Telephone Encounter (Signed)
mri lumbar spine needs clinic review [phone] does not meet guidelines follow prompts for clinic review id # [id number]

## 2014-10-01 ENCOUNTER — Other Ambulatory Visit: Payer: Self-pay | Admitting: Family

## 2014-10-02 NOTE — Telephone Encounter (Signed)
Ok to refill 

## 2014-10-12 ENCOUNTER — Other Ambulatory Visit: Payer: Self-pay | Admitting: Family

## 2014-10-15 ENCOUNTER — Other Ambulatory Visit: Payer: Self-pay | Admitting: Neurology

## 2014-10-16 ENCOUNTER — Encounter: Payer: Self-pay | Admitting: *Deleted

## 2014-10-16 NOTE — Progress Notes (Signed)
Clonazepam rx. up front [facility]/fim

## 2014-10-17 ENCOUNTER — Telehealth: Payer: Self-pay | Admitting: Neurology

## 2014-10-17 NOTE — Telephone Encounter (Signed)
Willie Lucas with [facility] called for refill on  clonazePAM (KLONOPIN) 0.5 MG tablet . She states 1 request on 7/25 was sent with no response. Pharmacy phone number [phone].

## 2014-10-17 NOTE — Telephone Encounter (Signed)
Patient called and wanted to follow up on the previous request. He would like to speak with Faith RN. Please call and advise.

## 2014-10-17 NOTE — Telephone Encounter (Signed)
LMOM that rx. is up front [facility] to be picked up.  He does not need to return this call unless he has questions/fim

## 2014-11-12 ENCOUNTER — Other Ambulatory Visit: Payer: Self-pay | Admitting: Neurology

## 2014-11-12 NOTE — Telephone Encounter (Signed)
Patient called requesting refill for maxalt, he would like the medication that dissolves on the tongue. Please call and advise. Patient can be reached at [phone].

## 2015-02-06 ENCOUNTER — Telehealth: Payer: Self-pay | Admitting: Neurology

## 2015-02-06 DIAGNOSIS — R202 Paresthesia of skin: Secondary | ICD-10-CM

## 2015-02-06 DIAGNOSIS — G35 Multiple sclerosis: Secondary | ICD-10-CM

## 2015-02-06 NOTE — Telephone Encounter (Signed)
Pt called sts he is having some sensory issues as in tingling all over and would like oral steroid called to pharmacy. Also he sts he is overdue for MRI and would like referral.

## 2015-02-07 MED ORDER — METHYLPREDNISOLONE 4 MG PO TBPK: ORAL_TABLET | ORAL | Status: DC

## 2015-02-07 MED ORDER — RIZATRIPTAN BENZOATE 5 MG PO TABS: 5.0000 mg | ORAL_TABLET | ORAL | Status: DC | PRN

## 2015-02-07 NOTE — Telephone Encounter (Signed)
LMTC./fim 

## 2015-02-07 NOTE — Telephone Encounter (Signed)
Patient called requesting a refill for rizatriptan (MAXALT) 5 MG tablet as well. He would like this to be called in @ his [facility]. Thanks!

## 2015-02-07 NOTE — Telephone Encounter (Signed)
Maxalt rx. escribed to [facility] as requested/fim

## 2015-02-07 NOTE — Telephone Encounter (Signed)
I have spoken with Willie Lucas this morning.  He c/o generalized tingling all extremities, some in face, burning sensation in neck.  Per RAS, ok for Medrol dose pk, which I have escribed to [facility] per [place]' request, and also for MRI brain and c-spine, both with and without contrast.  Orders in EPIC/fim

## 2015-02-07 NOTE — Addendum Note (Signed)
Addended by: France Ravens I on: 02/07/2015 11:03 AM   Modules accepted: Orders

## 2015-02-07 NOTE — Telephone Encounter (Signed)
Patient returned call

## 2015-03-13 ENCOUNTER — Ambulatory Visit (INDEPENDENT_AMBULATORY_CARE_PROVIDER_SITE_OTHER): Payer: BLUE CROSS/BLUE SHIELD

## 2015-03-13 DIAGNOSIS — R202 Paresthesia of skin: Secondary | ICD-10-CM | POA: Diagnosis not present

## 2015-03-13 DIAGNOSIS — G35 Multiple sclerosis: Secondary | ICD-10-CM | POA: Diagnosis not present

## 2015-03-13 MED ORDER — GADOPENTETATE DIMEGLUMINE 469.01 MG/ML IV SOLN: 19.0000 mL | Freq: Once | INTRAVENOUS | Status: AC | PRN

## 2015-03-21 ENCOUNTER — Telehealth: Payer: Self-pay | Admitting: Neurology

## 2015-03-21 NOTE — Telephone Encounter (Signed)
I called to let him know that the MRI of the brain shows only a few MS plaques, none of which were new. The MRI of the cervical spine did not show any MS but he did have mild degenerative changes at C5-C6.   He expressed understanding.

## 2015-04-08 ENCOUNTER — Ambulatory Visit: Payer: Self-pay | Admitting: Family Medicine

## 2015-08-23 ENCOUNTER — Ambulatory Visit (INDEPENDENT_AMBULATORY_CARE_PROVIDER_SITE_OTHER): Payer: BLUE CROSS/BLUE SHIELD | Admitting: Family Medicine

## 2015-08-23 VITALS — BP 130/88 | HR 74 | Temp 98.1°F | Resp 15 | Ht 70.0 in | Wt 198.2 lb

## 2015-08-23 DIAGNOSIS — N4889 Other specified disorders of penis: Secondary | ICD-10-CM | POA: Diagnosis not present

## 2015-08-23 DIAGNOSIS — N489 Disorder of penis, unspecified: Secondary | ICD-10-CM

## 2015-08-23 DIAGNOSIS — R21 Rash and other nonspecific skin eruption: Secondary | ICD-10-CM

## 2015-08-23 LAB — POCT SKIN KOH: Skin KOH, POC: NEGATIVE

## 2015-08-23 MED ORDER — CLOTRIMAZOLE 1 % EX CREA: 1.0000 "application " | TOPICAL_CREAM | Freq: Two times a day (BID) | CUTANEOUS | Status: DC

## 2015-08-23 MED ORDER — FLUCONAZOLE 150 MG PO TABS: 150.0000 mg | ORAL_TABLET | Freq: Every day | ORAL | Status: DC

## 2015-08-23 MED ORDER — TRIAMCINOLONE ACETONIDE 0.1 % EX CREA: 1.0000 "application " | TOPICAL_CREAM | Freq: Two times a day (BID) | CUTANEOUS | Status: DC

## 2015-08-23 NOTE — Patient Instructions (Addendum)
     IF you received an x-ray today, you will receive an invoice from [facility]. Please contact [facility] at [phone] with questions or concerns regarding your invoice.   IF you received labwork today, you will receive an invoice from [employer]. Please contact [facility] at [phone] with questions or concerns regarding your invoice.   Our billing staff will not be able to assist you with questions regarding bills from these companies.  You will be contacted with the lab results as soon as they are available. The fastest way to get your results is to activate your My Chart account. Instructions are located on the last page of this paperwork. If you have not heard from [place] regarding the results in 2 weeks, please contact this office.     The rash of the groin may still be due to a fungus or yeast infection, versus a contact dermatitis. Use the clotrimazole twice per day and steroid cream 2-3 times per day as needed for the itching or irritated areas. Diflucan pill once per day for the next 3 days to treat for possible yeast infection. If this is not improving within the next 7-10 days, return for recheck.   The area at the base of the penis appears to be a possible genital wart.  Keep follow-up with the dermatologist later this month for further evaluation and decision on excising the area versus applying cream or freezing it. You can decide at that visit when to have that procedure done.  Return to the clinic or go to the nearest emergency room if any of your symptoms worsen or new symptoms occur.

## 2015-08-23 NOTE — Progress Notes (Signed)
Subjective:  By signing my name below, I, [name], attest that this documentation has been prepared under the direction and in the presence of Merri Ray, MD. Electronically Signed: [name], [place]. 08/23/2015 , 9:44 AM .  Patient was seen in Room 1 .   Patient ID: Willie Lucas, male    DOB: 1965/06/03, 50 y.o.   MRN: [id number] Chief Complaint  Patient presents with  . Rash    Groin area. x3weeks   . Mass    Base of penis    HPI Willie Lucas is a 50 y.o. male  Patient states he developed a jock itch that started outside of his scrotum about 3 weeks ago. He's usually very clean; however, the area has become worse as it's spread towards his anus. He's tried treatments 2 weeks ago with antifungal cream and spray, applied twice a day.   He also mentions having a mass growing at the base of his penis that was noticed a few months ago. He's made an appointment with a dermatologist, scheduled in late June. He denies any pain or itch from the area. He denies applying any treatments to the area. He denies myalgia, nausea, or vomiting. He denies history of STI. He denies blister or any other rash. He denies any changes with soap or detergent. His girlfriend had history of HPV.   He plans to go on a trip to [place] on July 1st with his girlfriend.   Patient Active Problem List   Diagnosis Date Noted  . Common migraine without intractability 07/04/2014  . Exercise-induced reactive airway disease 04/24/2014  . Pure hypercholesterolemia 03/08/2013  . SCIATICA 07/16/2009  . MUSCLE SPASM, LUMBOSACRAL REGION 07/16/2009  . Lumbar radicular pain 07/16/2009  . URI 04/30/2009  . MULTIPLE SCLEROSIS 03/28/2009  . ACUT PEPTC ULCR UNS SITE W/HEM W/O MENTION OBST 03/28/2009  . HEADACHE 03/28/2009   Past Medical History  Diagnosis Date  . Headache(784.0)   . MS (multiple sclerosis) ([place])   . Neuromuscular disorder ([place])   . Ulcer     hx of duodenal ulcer at age 5  . Anxiety     Past Surgical History  Procedure Laterality Date  . Rhinoplasty      X2   No Known Allergies Prior to Admission medications   Medication Sig Start Date End Date Taking? Authorizing Provider  albuterol (PROVENTIL HFA;VENTOLIN HFA) 108 (90 BASE) MCG/ACT inhaler Inhale 1 puff into the lungs every 6 (six) hours as needed for wheezing or shortness of breath. 04/24/14  Yes Kennyth Arnold, FNP  omeprazole (PRILOSEC) 40 MG capsule Take 1 capsule (40 mg total) by mouth daily. 06/06/12  Yes Kennyth Arnold, FNP   Social History   Social History  . Marital Status: Married    Spouse Name: N/A  . Number of Children: N/A  . Years of Education: N/A   Occupational History  . Music therapist    Social History Main Topics  . Smoking status: Former Research scientist (life sciences)  . Smokeless tobacco: Never Used  . Alcohol Use: 0.0 oz/week    2-3 Glasses of wine per week  . Drug Use: No  . Sexual Activity: Not on file   Other Topics Concern  . Not on file   Social History Narrative   Review of Systems  Constitutional: Negative for fever, chills and fatigue.  Gastrointestinal: Negative for nausea, vomiting and diarrhea.  Genitourinary: Positive for genital sores. Negative for dysuria, discharge, penile pain and testicular pain.  Musculoskeletal: Negative for  myalgias.  Skin: Positive for rash.       Objective:   Physical Exam  Constitutional: He is oriented to person, place, and time. He appears well-developed and well-nourished. No distress.  HENT:  Head: Normocephalic and atraumatic.  Eyes: EOM are normal. Pupils are equal, round, and reactive to light.  Neck: Neck supple.  Cardiovascular: Normal rate.   Pulmonary/Chest: Effort normal. No respiratory distress.  Abdominal: Soft. He exhibits no distension. There is no tenderness.  Genitourinary:  Minimal dry skin and slight erythema perianal; some erythema as well as some irritation along inguinal folds; slight erythema to scrotum as well There is an  approximately 6-72mm, slightly hyperpigmented, on left penile shaft base, no surrounding lesions or erythema  No fluorescents with lamp  Musculoskeletal: Normal range of motion.  Neurological: He is alert and oriented to person, place, and time.  Skin: Skin is warm and dry.  Psychiatric: He has a normal mood and affect. His behavior is normal.  Nursing note and vitals reviewed.   Filed Vitals:   08/23/15 0858  BP: 130/88  Pulse: 74  Temp: 98.1 F (36.7 C)  TempSrc: Oral  Resp: 15  Height: 5\' 10"  (1.778 m)  Weight: 198 lb 3.2 oz (89.903 kg)  SpO2: 99%   Results for orders placed or performed in visit on 08/23/15  POCT Skin KOH  Result Value Ref Range   Skin KOH, POC Negative       Assessment & Plan:   Willie Lucas is a 50 y.o. male Rash of groin - Plan: POCT Skin KOH, fluconazole (DIFLUCAN) 150 MG tablet, clotrimazole (LOTRIMIN) 1 % cream, triamcinolone cream (KENALOG) 0.1 %  - Candida versus tinea with possible false-negative testing as difficult to obtain skin scraping. Also in differential is a contact dermatitis.  -Clotrimazole twice per day,  triamcinolone topically 2-3 times per day, and Diflucan 150 mg daily 3 days.  -Dye free, allergy free detergent. Mild soap such as Dove.  -RTC precautions if not improving.  Penile lesion  - Suspected genital wart. Has dermatology follow-up in a few weeks. Planning on going to [place] a few days later. Deferred [place] or other treatment currently, as can discuss treatment options with dermatology including possible excision. Precautions if worsening sooner.   Meds ordered this encounter  Medications  . rizatriptan (MAXALT) 5 MG tablet    Sig:     Refill:  0  . fluconazole (DIFLUCAN) 150 MG tablet    Sig: Take 1 tablet (150 mg total) by mouth daily.    Dispense:  3 tablet    Refill:  0  . clotrimazole (LOTRIMIN) 1 % cream    Sig: Apply 1 application topically 2 (two) times daily.    Dispense:  30 g    Refill:  0  .  triamcinolone cream (KENALOG) 0.1 %    Sig: Apply 1 application topically 2 (two) times daily.    Dispense:  30 g    Refill:  0   Patient Instructions       IF you received an x-ray today, you will receive an invoice from [facility]. Please contact [facility] at [phone] with questions or concerns regarding your invoice.   IF you received labwork today, you will receive an invoice from [employer]. Please contact [facility] at [phone] with questions or concerns regarding your invoice.   Our billing staff will not be able to assist you with questions regarding bills from these companies.  You will be  contacted with the lab results as soon as they are available. The fastest way to get your results is to activate your My Chart account. Instructions are located on the last page of this paperwork. If you have not heard from [place] regarding the results in 2 weeks, please contact this office.     The rash of the groin may still be due to a fungus or yeast infection, versus a contact dermatitis. Use the clotrimazole twice per day and steroid cream 2-3 times per day as needed for the itching or irritated areas. Diflucan pill once per day for the next 3 days to treat for possible yeast infection. If this is not improving within the next 7-10 days, return for recheck.   The area at the base of the penis appears to be a possible genital wart.  Keep follow-up with the dermatologist later this month for further evaluation and decision on excising the area versus applying cream or freezing it. You can decide at that visit when to have that procedure done.  Return to the clinic or go to the nearest emergency room if any of your symptoms worsen or new symptoms occur.     I personally performed the services described in this documentation, which was scribed in my presence. The recorded information has been reviewed and considered, and addended by me as  needed.   Signed,   Merri Ray, MD [facility].  08/23/2015 10:00 AM

## 2015-09-10 DIAGNOSIS — D485 Neoplasm of uncertain behavior of skin: Secondary | ICD-10-CM | POA: Diagnosis not present

## 2015-09-10 DIAGNOSIS — A630 Anogenital (venereal) warts: Secondary | ICD-10-CM | POA: Diagnosis not present

## 2015-11-28 DIAGNOSIS — L821 Other seborrheic keratosis: Secondary | ICD-10-CM | POA: Diagnosis not present

## 2015-11-28 DIAGNOSIS — L918 Other hypertrophic disorders of the skin: Secondary | ICD-10-CM | POA: Diagnosis not present

## 2015-11-28 DIAGNOSIS — D225 Melanocytic nevi of trunk: Secondary | ICD-10-CM | POA: Diagnosis not present

## 2016-01-14 ENCOUNTER — Telehealth: Payer: Self-pay | Admitting: Neurology

## 2016-01-14 NOTE — Telephone Encounter (Signed)
I have spoken with Ryelin this afternoon--RAS is ooo this week, and he has not been seen since April 2016.  Unable to fill Clonazepam without an appt.  Appt. with Hoyle Sauer given for tomorrow am/fim

## 2016-01-14 NOTE — Telephone Encounter (Signed)
Patient called to request refills of BACLOFEN and CLONAZEPAM, would like these refills done today.

## 2016-01-15 ENCOUNTER — Other Ambulatory Visit: Payer: Self-pay | Admitting: Neurology

## 2016-01-15 ENCOUNTER — Encounter: Payer: Self-pay | Admitting: Nurse Practitioner

## 2016-01-15 ENCOUNTER — Ambulatory Visit (INDEPENDENT_AMBULATORY_CARE_PROVIDER_SITE_OTHER): Payer: BLUE CROSS/BLUE SHIELD | Admitting: Nurse Practitioner

## 2016-01-15 VITALS — BP 142/89 | HR 59 | Ht 70.0 in | Wt 198.6 lb

## 2016-01-15 DIAGNOSIS — G35 Multiple sclerosis: Secondary | ICD-10-CM

## 2016-01-15 DIAGNOSIS — M5416 Radiculopathy, lumbar region: Secondary | ICD-10-CM

## 2016-01-15 MED ORDER — BACLOFEN 10 MG PO TABS: 10.0000 mg | ORAL_TABLET | Freq: Three times a day (TID) | ORAL | 6 refills | Status: DC

## 2016-01-15 MED ORDER — CLONAZEPAM 0.5 MG PO TABS: 0.5000 mg | ORAL_TABLET | Freq: Two times a day (BID) | ORAL | 5 refills | Status: DC | PRN

## 2016-01-15 NOTE — Patient Instructions (Signed)
Continue clonazepam when necessary as needed will refill Continue baclofen as needed muscle spasms will refill Follow-up when necessary

## 2016-01-15 NOTE — Progress Notes (Signed)
Fax confirmation received for klonopin [phone].

## 2016-01-15 NOTE — Progress Notes (Signed)
[facility]  PATIENT: Willie Lucas DOB: 10-31-1965   REASON FOR VISIT: Follow-up for history of lumbar radiculopathy, history of MS HISTORY FROM: Patient  HISTORY OF PRESENT ILLNESS:UPDATE 01/15/16 CM Willie Lucas, 50 year old male returns for follow-up. He has a history of MS however has been off medications for approximately 6 years. He has a history of lumbar radiculopathy and his symptoms are well controlled with baclofen and clonazepam. He was last seen in the office by Dr. Felecia Shelling in April 2016. At that time he had epidural steroid injections 3. He continues to exercise. He denies any numbness in either leg and he has not noted any bowel or bladder changes. Most recent MRI of the brain 03/15/15 shows only a few MS plaques noted which weren't new. MRI of cervical spine does not show any MS but  mild degenerative changes at C5-C6. He returns for reevaluation he needs medication refills   4/22/16RSNiles Nyoka Lucas is a 50 year old man with multiple sclerosis who also has lumbar radiculopathy. Ten days ago he had an epidural steroid injection. Pain was much better for the next week. Then yesterday he began to experience some back pain that built up overnight. When he woke up this morning he was more stiff having pain in the back. When he bent over he had a sudden surge of extreme pain. Pain is knifelike and in the mid lower lumbar and upper sacral area. He is getting radiation into the right leg when he stands a certain way. There is more pain in the right buttock compared to the left.   Pain is reduced by sitting or leaning forward. Coughing increases the pain.   His right leg seems slightly weak to him. He does not have any fixed numbness in the right foot or leg. He has not noted any bowel or bladder changes. MRI in the past has shown disc herniation at L5-S1 towards the right with posterior displacement of the right S1 nerve root. There was also protrusion to the left at  L4-L5.   REVIEW OF SYSTEMS: Full 14 system review of systems performed and notable only for those listed, all others are neg:  Constitutional: neg  Cardiovascular: neg Ear/Nose/Throat: neg  Skin: neg Eyes: neg Respiratory: neg Gastroitestinal: neg  Hematology/Lymphatic: neg  Endocrine: neg Musculoskeletal: Back pain neck stiffness Allergy/Immunology: neg Neurological: neg Psychiatric: neg Sleep : neg   ALLERGIES: No Known Allergies  HOME MEDICATIONS: Outpatient Medications Prior to Visit  Medication Sig Dispense Refill  . omeprazole (PRILOSEC) 40 MG capsule Take 1 capsule (40 mg total) by mouth daily. 30 capsule 3  . albuterol (PROVENTIL HFA;VENTOLIN HFA) 108 (90 BASE) MCG/ACT inhaler Inhale 1 puff into the lungs every 6 (six) hours as needed for wheezing or shortness of breath. (Patient not taking: Reported on 01/15/2016) 1 Inhaler 2  . clotrimazole (LOTRIMIN) 1 % cream Apply 1 application topically 2 (two) times daily. (Patient not taking: Reported on 01/15/2016) 30 g 0  . fluconazole (DIFLUCAN) 150 MG tablet Take 1 tablet (150 mg total) by mouth daily. (Patient not taking: Reported on 01/15/2016) 3 tablet 0  . rizatriptan (MAXALT) 5 MG tablet   0  . triamcinolone cream (KENALOG) 0.1 % Apply 1 application topically 2 (two) times daily. (Patient not taking: Reported on 01/15/2016) 30 g 0   Facility-Administered Medications Prior to Visit  Medication Dose Route Frequency Provider Last Rate Last Dose  . gadopentetate dimeglumine (MAGNEVIST) injection 19 mL  19 mL Intravenous Once PRN Britt Bottom, MD  PAST MEDICAL HISTORY: Past Medical History:  Diagnosis Date  . Anxiety   . Headache(784.0)   . MS (multiple sclerosis) ([employer])   . Neuromuscular disorder ([place])   . Ulcer ([place])    hx of duodenal ulcer at age 57    PAST SURGICAL HISTORY: Past Surgical History:  Procedure Laterality Date  . RHINOPLASTY     X2    FAMILY HISTORY: Family History  Problem  Relation Age of Onset  . Diabetes Mother   . Arthritis Mother   . Brain cancer Brother     died    SOCIAL HISTORY: Social History   Social History  . Marital status: Married    Spouse name: N/A  . Number of children: N/A  . Years of education: N/A   Occupational History  . Music therapist    Social History Main Topics  . Smoking status: Former Research scientist (life sciences)  . Smokeless tobacco: Never Used  . Alcohol use 0.0 oz/week    2 - 3 Glasses of wine per week  . Drug use: No  . Sexual activity: Not on file   Other Topics Concern  . Not on file   Social History Narrative  . No narrative on file     PHYSICAL EXAM  Vitals:   01/15/16 1102  BP: (!) 142/89  Pulse: (!) 59  Weight: 198 lb 9.6 oz (90.1 kg)  Height: 5\' 10"  (1.778 m)   Body mass index is 28.5 kg/m.  Generalized: Well developed, in no acute distress  Head: normocephalic and atraumatic,. Oropharynx benign  Musculoskeletal: No deformity   Neurological examination   Mentation: Alert oriented to time, place, history taking. Attention span and concentration appropriate. Recent and remote memory intact.  Follows all commands speech and language fluent.   Cranial nerve II-XII: .Pupils were equal round reactive to light extraocular movements were full, visual field were full on confrontational test. Facial sensation and strength were normal. hearing was intact to finger rubbing bilaterally. Uvula tongue midline. head turning and shoulder shrug were normal and symmetric.Tongue protrusion into cheek strength was normal. Motor: normal bulk and tone, full strength in the BUE, BLE, fine finger movements normal, no pronator drift. No focal weakness Sensory: normal and symmetric to light touch, pinprick, and  Vibration, on the upper and lower extremities  Coordination: finger-nose-finger, heel-to-shin bilaterally, no dysmetria Reflexes: Brachioradialis 2/2, biceps 2/2, triceps 2/2, patellar 2/2, Achilles 2/2, plantar responses were  flexor bilaterally. Gait and Station: Rising up from seated position without assistance, normal stance,  moderate stride, good arm swing, smooth turning, able to perform tiptoe, and heel walking without difficulty. Tandem gait is steady  DIAGNOSTIC DATA (LABS, IMAGING, TESTING)  ASSESSMENT AND PLAN  50 y.o. year old male  has a past medical history of Anxiety; Headache(784.0); MS (multiple sclerosis) ([place]); Neuromuscular disorder ([place]); and Ulcer ([place]). here to follow-up for his radiculopathy. MRI of the brain shows only a few MS plaques, none of which were new. The MRI of the cervical spine did not show any MS but he did have mild degenerative changes at C5-C6.The patient is a current patient of Dr. Felecia Shelling  who is out of the office today . This note is sent to the work in doctor.      Continue clonazepam when necessary as needed will refill Continue baclofen as needed muscle spasms will refill Follow-up when necessary Dennie Bible, [facility], [facility], APRN  [facility] [address] [place], [place] [address] [phone]

## 2016-01-16 NOTE — Progress Notes (Signed)
I reviewed note and agree with plan.   Penni Bombard, MD [id number], [id number] PM Certified in Neurology, Neurophysiology and Neuroimaging  [facility] [address], [place], [place] [address] [phone]

## 2016-02-04 ENCOUNTER — Telehealth: Payer: Self-pay

## 2016-02-04 ENCOUNTER — Ambulatory Visit (INDEPENDENT_AMBULATORY_CARE_PROVIDER_SITE_OTHER): Payer: BLUE CROSS/BLUE SHIELD | Admitting: Family Medicine

## 2016-02-04 VITALS — BP 122/82 | HR 57 | Temp 98.0°F | Resp 16 | Ht 70.0 in | Wt 196.6 lb

## 2016-02-04 DIAGNOSIS — K529 Noninfective gastroenteritis and colitis, unspecified: Secondary | ICD-10-CM | POA: Diagnosis not present

## 2016-02-04 MED ORDER — CIPROFLOXACIN HCL 500 MG PO TABS: 500.0000 mg | ORAL_TABLET | Freq: Two times a day (BID) | ORAL | 0 refills | Status: DC

## 2016-02-04 MED ORDER — CIPROFLOXACIN HCL 750 MG PO TABS: 750.0000 mg | ORAL_TABLET | Freq: Two times a day (BID) | ORAL | 0 refills | Status: DC

## 2016-02-04 MED ORDER — DICYCLOMINE HCL 20 MG PO TABS: 20.0000 mg | ORAL_TABLET | Freq: Four times a day (QID) | ORAL | 0 refills | Status: DC | PRN

## 2016-02-04 MED ORDER — ONDANSETRON 8 MG PO TBDP: 8.0000 mg | ORAL_TABLET | Freq: Three times a day (TID) | ORAL | 0 refills | Status: DC | PRN

## 2016-02-04 NOTE — Patient Instructions (Addendum)
IF you received an x-ray today, you will receive an invoice from [facility]. Please contact [facility] at [phone] with questions or concerns regarding your invoice.   IF you received labwork today, you will receive an invoice from [employer]. Please contact [facility] at [phone] with questions or concerns regarding your invoice.   Our billing staff will not be able to assist you with questions regarding bills from these companies.  You will be contacted with the lab results as soon as they are available. The fastest way to get your results is to activate your My Chart account. Instructions are located on the last page of this paperwork. If you have not heard from [place] regarding the results in 2 weeks, please contact this office.      Food Choices to Help Relieve Diarrhea, Adult When you have diarrhea, the foods you eat and your eating habits are very important. Choosing the right foods and drinks can help relieve diarrhea. Also, because diarrhea can last up to 7 days, you need to replace lost fluids and electrolytes (such as sodium, potassium, and chloride) in order to help prevent dehydration. What general guidelines do I need to follow?  Slowly drink 1 cup (8 oz) of fluid for each episode of diarrhea. If you are getting enough fluid, your urine will be clear or pale yellow.  Eat starchy foods. Some good choices include white rice, white toast, pasta, low-fiber cereal, baked potatoes (without the skin), saltine crackers, and bagels.  Avoid large servings of any cooked vegetables.  Limit fruit to two servings per day. A serving is  cup or 1 small piece.  Choose foods with less than 2 g of fiber per serving.  Limit fats to less than 8 tsp (38 g) per day.  Avoid fried foods.  Eat foods that have probiotics in them. Probiotics can be found in certain dairy products.  Avoid foods and beverages that may increase the speed at  which food moves through the stomach and intestines (gastrointestinal tract). Things to avoid include:  High-fiber foods, such as dried fruit, raw fruits and vegetables, nuts, seeds, and whole grain foods.  Spicy foods and high-fat foods.  Foods and beverages sweetened with high-fructose corn syrup, honey, or sugar alcohols such as xylitol, sorbitol, and mannitol. What foods are recommended? Grains  White rice. White, [place], or pita breads (fresh or toasted), including plain rolls, buns, or bagels. White pasta. Saltine, soda, or graham crackers. Pretzels. Low-fiber cereal. Cooked cereals made with water (such as cornmeal, farina, or cream cereals). Plain muffins. Matzo. Melba toast. Zwieback. Vegetables  Potatoes (without the skin). Strained tomato and vegetable juices. Most well-cooked and canned vegetables without seeds. Tender lettuce. Fruits  Cooked or canned applesauce, apricots, cherries, fruit cocktail, grapefruit, peaches, pears, or plums. Fresh bananas, apples without skin, cherries, grapes, cantaloupe, grapefruit, peaches, oranges, or plums. Meat and Other Protein Products  Baked or boiled chicken. Eggs. Tofu. Fish. Seafood. Smooth peanut butter. Ground or well-cooked tender beef, ham, veal, lamb, pork, or poultry. Dairy  Plain yogurt, kefir, and unsweetened liquid yogurt. Lactose-free milk, buttermilk, or soy milk. Plain hard cheese. Beverages  Sport drinks. Clear broths. Diluted fruit juices (except prune). Regular, caffeine-free sodas such as ginger ale. Water. Decaffeinated teas. Oral rehydration solutions. Sugar-free beverages not sweetened with sugar alcohols. Other  Bouillon, broth, or soups made from recommended foods. The items listed above may not be a complete list of recommended foods or beverages. Contact your dietitian  for more options.  What foods are not recommended? Grains  Whole grain, whole wheat, bran, or rye breads, rolls, pastas, crackers, and cereals. Wild  or brown rice. Cereals that contain more than 2 g of fiber per serving. Corn tortillas or taco shells. Cooked or dry oatmeal. Granola. Popcorn. Vegetables  Raw vegetables. Cabbage, broccoli, Brussels sprouts, artichokes, baked beans, beet greens, corn, kale, legumes, peas, sweet potatoes, and yams. Potato skins. Cooked spinach and cabbage. Fruits  Dried fruit, including raisins and dates. Raw fruits. Stewed or dried prunes. Fresh apples with skin, apricots, mangoes, pears, raspberries, and strawberries. Meat and Other Protein Products  Chunky peanut butter. Nuts and seeds. Beans and lentils. Berniece Salines. Dairy  High-fat cheeses. Milk, chocolate milk, and beverages made with milk, such as milk shakes. Cream. Ice cream. Sweets and Desserts  Sweet rolls, doughnuts, and sweet breads. Pancakes and waffles. Fats and Oils  Butter. Cream sauces. Margarine. Salad oils. Plain salad dressings. Olives. Avocados. Beverages  Caffeinated beverages (such as coffee, tea, soda, or energy drinks). Alcoholic beverages. Fruit juices with pulp. Prune juice. Soft drinks sweetened with high-fructose corn syrup or sugar alcohols. Other  Coconut. Hot sauce. Chili powder. Mayonnaise. Gravy. Cream-based or milk-based soups. The items listed above may not be a complete list of foods and beverages to avoid. Contact your dietitian for more information.  What should I do if I become dehydrated? Diarrhea can sometimes lead to dehydration. Signs of dehydration include dark urine and dry mouth and skin. If you think you are dehydrated, you should rehydrate with an oral rehydration solution. These solutions can be purchased at pharmacies, retail stores, or online. Drink -1 cup (120-240 mL) of oral rehydration solution each time you have an episode of diarrhea. If drinking this amount makes your diarrhea worse, try drinking smaller amounts more often. For example, drink 1-3 tsp (5-15 mL) every 5-10 minutes. A general rule for staying  hydrated is to drink 1-2 L of fluid per day. Talk to your health care provider about the specific amount you should be drinking each day. Drink enough fluids to keep your urine clear or pale yellow. This information is not intended to replace advice given to you by your health care provider. Make sure you discuss any questions you have with your health care provider. Document Released: 05/30/2003 Document Revised: 08/15/2015 Document Reviewed: 01/30/2013 Elsevier Interactive Patient Education  2017 [place].   Probiotics Introduction WHAT ARE PROBIOTICS? Probiotics are the good bacteria and yeasts that live in your body and keep you and your digestive system healthy. Probiotics also help your body's defense (immune) system and protect your body against bad bacterial growth. Certain foods contain probiotics, such as yogurt. Probiotics can also be purchased as a supplement. As with any supplement or drug, it is important to discuss its use with your health care provider. WHAT AFFECTS THE BALANCE OF BACTERIA IN MY BODY? The balance of bacteria in your body can be affected by:  Antibiotic medicines. Antibiotics are sometimes necessary to treat infection. Unfortunately, they may kill good or friendly bacteria in your body as well as the bad bacteria. This may lead to stomach problems like diarrhea, gas, and cramping.  Disease. Some conditions are the result of an overgrowth of bad bacteria, yeasts, parasites, or fungi. These conditions include:  Infectious diarrhea.  Stomach and respiratory infections.  Skin infections.  Irritable bowel syndrome (IBS).  Inflammatory bowel diseases.  Ulcer due to Helicobacter pylori (H. pylori) infection.  Tooth decay and periodontal disease.  Vaginal infections. Stress and poor diet may also lower the good bacteria in your body. WHAT TYPE OF PROBIOTIC IS RIGHT FOR ME? Probiotics are available over the counter at your local pharmacy, health food,  or grocery store. They come in many different forms, combinations of strains, and dosing strengths. Some may need to be refrigerated. Always read the label for storage and usage instructions. Specific strains have been shown to be more effective for certain conditions. Ask your health care provider what option is best for you. WHY WOULD I NEED PROBIOTICS? There are many reasons your health care provider might recommend a probiotic supplement, including:  Diarrhea.  Constipation.  IBS.  Respiratory infections.  Yeast infections.  Acne, eczema, and other skin conditions.  Frequent urinary tract infections (UTIs). ARE THERE SIDE EFFECTS OF PROBIOTICS? Some people experience mild side effects when taking probiotics. Side effects are usually temporary and may include:  Gas.  Bloating.  Cramping. Rarely, serious side effects, such as infection or immune system changes, may occur. WHAT ELSE DO I NEED TO KNOW ABOUT PROBIOTICS?  There are many different strains of probiotics. Certain strains may be more effective depending on your condition. Probiotics are available in varying doses. Ask your health care provider which probiotic you should use and how often.  If you are taking probiotics along with antibiotics, it is generally recommended to wait at least 2 hours between taking the antibiotic and taking the probiotic. FOR MORE INFORMATION: [facility] [email or web address] This information is not intended to replace advice given to you by your health care provider. Make sure you discuss any questions you have with your health care provider. Document Released: 10/04/2013 Document Revised: 08/15/2015 Document Reviewed: 06/06/2013  2017 Elsevier

## 2016-02-04 NOTE — Progress Notes (Signed)
Subjective:  By signing my name below, I, Willie Lucas, attest that this documentation has been prepared under the direction and in the presence of Delman Cheadle, MD. Electronically Signed: Moises Lucas, [place]. 02/04/2016 , 10:20 AM .  Patient was seen in Room 13 .   Patient ID: Willie Lucas, male    DOB: January 31, 1966, 51 y.o.   MRN: [id number] Chief Complaint  Patient presents with  . Abdominal Pain    x 3 days/ pt went to [place] this past wkend  . Nausea    x 3 days  . Diarrhea    x 3 days   HPI Willie Lucas is a 50 y.o. male who presents to [facility] complaining of fatigue and abdominal cramping pain with nausea and diarrhea starting about 3 days ago. Patient informs he was in [place] over the past weekend with his girlfriend. He also reports intermittent fever. He's been feeling nauseous but has not vomited; he's also been able to drink water and able to keep it down. His girlfriend has similar symptoms, which started about 12 hours before the patient had the symptoms. She was instructed to take OTC medication and prescribed zpak. He denies chest pain or urinary symptoms. He denies Lucas in stool or melena. He denies history of abdominal surgeries. He denies history of diverticulitis. He did have probiotic this morning.   Past Medical History:  Diagnosis Date  . Anxiety   . Headache(784.0)   . MS (multiple sclerosis) ([place])   . Neuromuscular disorder ([place])   . Ulcer ([place])    hx of duodenal ulcer at age 49   Prior to Admission medications   Medication Sig Start Date End Date Taking? Authorizing Provider  albuterol (PROVENTIL HFA;VENTOLIN HFA) 108 (90 BASE) MCG/ACT inhaler Inhale 1 puff into the lungs every 6 (six) hours as needed for wheezing or shortness of breath. 04/24/14  Yes Kennyth Arnold, FNP  baclofen (LIORESAL) 10 MG tablet Take 1 tablet (10 mg total) by mouth 3 (three) times daily. 01/15/16  Yes Dennie Bible, NP  clonazePAM (KLONOPIN) 0.5 MG tablet Take 1 tablet (0.5 mg  total) by mouth 2 (two) times daily as needed for anxiety. 01/15/16  Yes Dennie Bible, NP  omeprazole (PRILOSEC) 40 MG capsule Take 1 capsule (40 mg total) by mouth daily. 06/06/12  Yes Kennyth Arnold, FNP   No Known Allergies  Review of Systems  Constitutional: Positive for activity change, appetite change, fatigue and fever. Negative for chills and diaphoresis.  Respiratory: Negative for cough, shortness of breath and wheezing.   Cardiovascular: Negative for chest pain.  Gastrointestinal: Positive for abdominal pain, diarrhea and nausea. Negative for Lucas in stool and vomiting.       Objective:   Physical Exam  Constitutional: He is oriented to person, place, and time. He appears well-developed and well-nourished. No distress.  HENT:  Head: Normocephalic and atraumatic.  Eyes: EOM are normal. Pupils are equal, round, and reactive to light.  Neck: Neck supple.  Cardiovascular: Normal rate, regular rhythm, S1 normal, S2 normal and normal heart sounds.   No murmur heard. Pulmonary/Chest: Effort normal and breath sounds normal. No respiratory distress. He has no wheezes.  Abdominal: Soft. Bowel sounds are normal. He exhibits no distension. There is tenderness in the epigastric area and left upper quadrant. There is CVA tenderness (mild, bilaterally).  Musculoskeletal: Normal range of motion.  Neurological: He is alert and oriented to person, place, and time.  Skin: Skin is warm and dry.  Psychiatric: He has a normal mood and affect. His behavior is normal.  Nursing note and vitals reviewed.   BP 122/82   Pulse (!) 57   Temp 98 F (36.7 C) (Oral)   Resp 16   Ht 5\' 10"  (1.778 m)   Wt 196 lb 9.6 oz (89.2 kg)   SpO2 99%   BMI 28.21 kg/m     Assessment & Plan:   1. Gastroenteritis   Advised to continue symptomatic care and push fluids. Ok to start cipro if sxs do not resolve within another 48 hrs.  Meds ordered this encounter  Medications  . DISCONTD: ciprofloxacin  (CIPRO) 500 MG tablet    Sig: Take 1 tablet (500 mg total) by mouth 2 (two) times daily.    Dispense:  6 tablet    Refill:  0  . ondansetron (ZOFRAN-ODT) 8 MG disintegrating tablet    Sig: Take 1 tablet (8 mg total) by mouth every 8 (eight) hours as needed for nausea.    Dispense:  20 tablet    Refill:  0  . dicyclomine (BENTYL) 20 MG tablet    Sig: Take 1 tablet (20 mg total) by mouth every 6 (six) hours as needed for spasms.    Dispense:  20 tablet    Refill:  0  . ciprofloxacin (CIPRO) 750 MG tablet    Sig: Take 1 tablet (750 mg total) by mouth 2 (two) times daily.    Dispense:  6 tablet    Refill:  0    I personally performed the services described in this documentation, which was scribed in my presence. The recorded information has been reviewed and considered, and addended by me as needed.   Delman Cheadle, M.D.  [facility] [address] [place], [place] [address] [phone] phone [phone] fax  02/21/16 9:11 AM

## 2016-02-05 NOTE — Telephone Encounter (Signed)
Thanks, he'd prob be fine on the 500 but that problem-solving sounds perfect.

## 2016-02-05 NOTE — Telephone Encounter (Signed)
Pharm called yesterday evening to report that pt had picked up the first cipro Rx that was sent in for the 500 mg, and then they realized they received a 2nd one for 750 mg. I checked chart and it looks like Dr Brigitte Pulse may have sent in the 500 mg first, but the final Rx ordered was the 750 mg. Pharm stated the Rx was only $2 and she would call pt to see if he could take 1 1/2 of the 500 mg tabs last night and then p/up the correct Rx for 750 today. I called back this morning to make sure that they had straightened Rxs out so that pt will be on the correct dose and was advised that they reached pt and he was in agreement. Dr Brigitte Pulse, [place].

## 2016-03-31 ENCOUNTER — Other Ambulatory Visit: Payer: Self-pay | Admitting: Neurology

## 2016-04-02 ENCOUNTER — Encounter: Payer: Self-pay | Admitting: Gastroenterology

## 2016-06-10 ENCOUNTER — Ambulatory Visit (INDEPENDENT_AMBULATORY_CARE_PROVIDER_SITE_OTHER): Payer: BLUE CROSS/BLUE SHIELD | Admitting: Family Medicine

## 2016-06-10 VITALS — BP 108/80 | HR 56 | Temp 98.1°F | Resp 17 | Ht 70.0 in | Wt 189.0 lb

## 2016-06-10 DIAGNOSIS — H8112 Benign paroxysmal vertigo, left ear: Secondary | ICD-10-CM

## 2016-06-10 MED ORDER — FLUTICASONE PROPIONATE 50 MCG/ACT NA SUSP: 2.0000 | Freq: Every day | NASAL | 0 refills | Status: DC

## 2016-06-10 MED ORDER — CLOTRIMAZOLE 1 % EX CREA: 1.0000 "application " | TOPICAL_CREAM | Freq: Two times a day (BID) | CUTANEOUS | 0 refills | Status: DC

## 2016-06-10 MED ORDER — FLUCONAZOLE 150 MG PO TABS: 150.0000 mg | ORAL_TABLET | Freq: Once | ORAL | 0 refills | Status: AC

## 2016-06-10 MED ORDER — MECLIZINE HCL 25 MG PO TABS: 25.0000 mg | ORAL_TABLET | Freq: Three times a day (TID) | ORAL | 0 refills | Status: DC | PRN

## 2016-06-10 NOTE — Progress Notes (Signed)
Willie Lucas is a 51 y.o. male who presents to Primary Care at [facility] today for:  1.  Dizziness: Patient presents with symptoms of dizziness starting last Thursday. States that he was on a plane started feeling motion sick. Symptoms have continued daily since then. They have increased in frequency. Having associated nausea and not feeling well. No emesis. No changes in vision. Patient states that sometimes he has to hold onto things. Has never had these symptoms before. Denies any congestion or recent illness. Does not recall any certain movements that may symptoms worse.   Feels like room spins: sometimes Lightheadedness when stands: yes Medications tried: none  Symptoms Hearing Loss: no Ear Pain or fullness: no Falls: no Head trauma: no  ROS as above.  Pertinently, no chest pain, palpitations, SOB, Fever, Chills, Abd pain, N/V/D.   PMH reviewed. Patient is a nonsmoker.   Past Medical History:  Diagnosis Date  . Anxiety   . Headache(784.0)   . MS (multiple sclerosis) ([place])   . Neuromuscular disorder ([place])   . Ulcer ([place])    hx of duodenal ulcer at age 31   Past Surgical History:  Procedure Laterality Date  . RHINOPLASTY     X2    Medications reviewed. Current Outpatient Prescriptions  Medication Sig Dispense Refill  . albuterol (PROVENTIL HFA;VENTOLIN HFA) 108 (90 BASE) MCG/ACT inhaler Inhale 1 puff into the lungs every 6 (six) hours as needed for wheezing or shortness of breath. 1 Inhaler 2  . baclofen (LIORESAL) 10 MG tablet Take 1 tablet (10 mg total) by mouth 3 (three) times daily. 90 each 6  . clonazePAM (KLONOPIN) 0.5 MG tablet Take 1 tablet (0.5 mg total) by mouth 2 (two) times daily as needed for anxiety. 60 tablet 5  . omeprazole (PRILOSEC) 40 MG capsule Take 1 capsule (40 mg total) by mouth daily. 30 capsule 3  . rizatriptan (MAXALT) 5 MG tablet take 1 tablet by mouth if needed for migraines may repeat IN 2 hours if needed 10 tablet 3   No current  facility-administered medications for this visit.    Facility-Administered Medications Ordered in Other Visits  Medication Dose Route Frequency Provider Last Rate Last Dose  . gadopentetate dimeglumine (MAGNEVIST) injection 19 mL  19 mL Intravenous Once PRN Britt Bottom, MD         Physical Exam:  BP 108/80 (BP Location: Right Arm, Patient Position: Sitting, Cuff Size: Large)   Pulse (!) 56   Temp 98.1 F (36.7 C) (Oral)   Resp 17   Ht 5\' 10"  (1.778 m)   Wt 189 lb (85.7 kg)   SpO2 98%   BMI 27.12 kg/m  Gen:  Alert, cooperative patient who appears stated age in no acute distress.  Vital signs reviewed. HEENT: EOMI,  MMM, PERRLA, neck supple with normal ROM, o/p clear, TMs normal bilaterally Pulm:  Clear to auscultation bilaterally with good air movement.  No wheezes or rales noted.   Cardiac:  Regular rate and rhythm without murmur auscultated.  Good S1/S2. Abd:  Soft/nondistended/nontender.  Good bowel sounds throughout all four quadrants.  No masses noted.  Neuro: A&Ox3, +dix-halpike on left with mild nystagmus, no focal deficits, CNs grossly intact  Assessment and Plan:  1. Benign paroxysmal positional vertigo of left ear Having dizziness. Positive Dix-Halpike maneuver on left consistent with vertigo. No concern for central process. Patient given instructions on how to perform the Epley manuveur to correct. If unable to correct consider referral to PT. Rx  for meclizine. Return precautions discussed. Handout given.    Luiz Blare, DO 06/10/2016, 3:08 PM PGY-3, [place]

## 2016-06-10 NOTE — Patient Instructions (Addendum)
Benign Positional Vertigo Vertigo is the feeling that you or your surroundings are moving when they are not. Benign positional vertigo is the most common form of vertigo. The cause of this condition is not serious (is benign). This condition is triggered by certain movements and positions (is positional). This condition can be dangerous if it occurs while you are doing something that could endanger you or others, such as driving. What are the causes? In many cases, the cause of this condition is not known. It may be caused by a disturbance in an area of the inner ear that helps your brain to sense movement and balance. This disturbance can be caused by a viral infection (labyrinthitis), head injury, or repetitive motion. What increases the risk? This condition is more likely to develop in:  Women.  People who are 50 years of age or older. What are the signs or symptoms? Symptoms of this condition usually happen when you move your head or your eyes in different directions. Symptoms may start suddenly, and they usually last for less than a minute. Symptoms may include:  Loss of balance and falling.  Feeling like you are spinning or moving.  Feeling like your surroundings are spinning or moving.  Nausea and vomiting.  Blurred vision.  Dizziness.  Involuntary eye movement (nystagmus). Symptoms can be mild and cause only slight annoyance, or they can be severe and interfere with daily life. Episodes of benign positional vertigo may return (recur) over time, and they may be triggered by certain movements. Symptoms may improve over time. How is this diagnosed? This condition is usually diagnosed by medical history and a physical exam of the head, neck, and ears. You may be referred to a health care provider who specializes in ear, nose, and throat (ENT) problems (otolaryngologist) or a provider who specializes in disorders of the nervous system (neurologist). You may have additional testing,  including:  MRI.  A CT scan.  Eye movement tests. Your health care provider may ask you to change positions quickly while he or she watches you for symptoms of benign positional vertigo, such as nystagmus. Eye movement may be tested with an electronystagmogram (ENG), caloric stimulation, the Dix-Hallpike test, or the roll test.  An electroencephalogram (EEG). This records electrical activity in your brain.  Hearing tests. How is this treated? Usually, your health care provider will treat this by moving your head in specific positions to adjust your inner ear back to normal. Surgery may be needed in severe cases, but this is rare. In some cases, benign positional vertigo may resolve on its own in 2-4 weeks. Follow these instructions at home: Safety   Move slowly.Avoid sudden body or head movements.  Avoid driving.  Avoid operating heavy machinery.  Avoid doing any tasks that would be dangerous to you or others if a vertigo episode would occur.  If you have trouble walking or keeping your balance, try using a cane for stability. If you feel dizzy or unstable, sit down right away.  Return to your normal activities as told by your health care provider. Ask your health care provider what activities are safe for you. General instructions   Take over-the-counter and prescription medicines only as told by your health care provider.  Avoid certain positions or movements as told by your health care provider.  Drink enough fluid to keep your urine clear or pale yellow.  Keep all follow-up visits as told by your health care provider. This is important. Contact a health care provider   if:  You have a fever.  Your condition gets worse or you develop new symptoms.  Your family or friends notice any behavioral changes.  Your nausea or vomiting gets worse.  You have numbness or a "pins and needles" sensation. Get help right away if:  You have difficulty speaking or moving.  You are  always dizzy.  You faint.  You develop severe headaches.  You have weakness in your legs or arms.  You have changes in your hearing or vision.  You develop a stiff neck.  You develop sensitivity to light. This information is not intended to replace advice given to you by your health care provider. Make sure you discuss any questions you have with your health care provider. Document Released: 12/15/2005 Document Revised: 08/15/2015 Document Reviewed: 07/02/2014 Elsevier Interactive Patient Education  2017 [place].   How to Perform the Epley Maneuver The Epley maneuver is an exercise that relieves symptoms of vertigo. Vertigo is the feeling that you or your surroundings are moving when they are not. When you feel vertigo, you may feel like the room is spinning and have trouble walking. Dizziness is a little different than vertigo. When you are dizzy, you may feel unsteady or light-headed. You can do this maneuver at home whenever you have symptoms of vertigo. You can do it up to 3 times a day until your symptoms go away. Even though the Epley maneuver may relieve your vertigo for a few weeks, it is possible that your symptoms will return. This maneuver relieves vertigo, but it does not relieve dizziness. What are the risks? If it is done correctly, the Epley maneuver is considered safe. Sometimes it can lead to dizziness or nausea that goes away after a short time. If you develop other symptoms, such as changes in vision, weakness, or numbness, stop doing the maneuver and call your health care provider. How to perform the Epley maneuver 1. Sit on the edge of a bed or table with your back straight and your legs extended or hanging over the edge of the bed or table. 2. Turn your head halfway toward the affected ear or side. 3. Lie backward quickly with your head turned until you are lying flat on your back. You may want to position a pillow under your shoulders. 4. Hold this position  for 30 seconds. You may experience an attack of vertigo. This is normal. 5. Turn your head to the opposite direction until your unaffected ear is facing the floor. 6. Hold this position for 30 seconds. You may experience an attack of vertigo. This is normal. Hold this position until the vertigo stops. 7. Turn your whole body to the same side as your head. Hold for another 30 seconds. 8. Sit back up. You can repeat this exercise up to 3 times a day. Follow these instructions at home:  After doing the Epley maneuver, you can return to your normal activities.  Ask your health care provider if there is anything you should do at home to prevent vertigo. He or she may recommend that you:  Keep your head raised (elevated) with two or more pillows while you sleep.  Do not sleep on the side of your affected ear.  Get up slowly from bed.  Avoid sudden movements during the day.  Avoid extreme head movement, like looking up or bending over. Contact a health care provider if:  Your vertigo gets worse.  You have other symptoms, including:  Nausea.  Vomiting.  Headache. Get  help right away if:  You have vision changes.  You have a severe or worsening headache or neck pain.  You cannot stop vomiting.  You have new numbness or weakness in any part of your body. Summary  Vertigo is the feeling that you or your surroundings are moving when they are not.  The Epley maneuver is an exercise that relieves symptoms of vertigo.  If the Epley maneuver is done correctly, it is considered safe. You can do it up to 3 times a day. This information is not intended to replace advice given to you by your health care provider. Make sure you discuss any questions you have with your health care provider. Document Released: 03/14/2013 Document Revised: 01/28/2016 Document Reviewed: 01/28/2016 Elsevier Interactive Patient Education  2017 [employer].     IF you received an x-ray today, you will  receive an invoice from [facility]. Please contact [facility] at [phone] with questions or concerns regarding your invoice.   IF you received labwork today, you will receive an invoice from [place]. Please contact [facility] at [phone] with questions or concerns regarding your invoice.   Our billing staff will not be able to assist you with questions regarding bills from these companies.  You will be contacted with the lab results as soon as they are available. The fastest way to get your results is to activate your My Chart account. Instructions are located on the last page of this paperwork. If you have not heard from [place] regarding the results in 2 weeks, please contact this office.

## 2016-07-01 ENCOUNTER — Ambulatory Visit (INDEPENDENT_AMBULATORY_CARE_PROVIDER_SITE_OTHER): Payer: BLUE CROSS/BLUE SHIELD | Admitting: Neurology

## 2016-07-01 ENCOUNTER — Telehealth: Payer: Self-pay | Admitting: Neurology

## 2016-07-01 ENCOUNTER — Encounter: Payer: Self-pay | Admitting: Neurology

## 2016-07-01 VITALS — BP 136/82 | HR 74 | Resp 14 | Ht 62.0 in | Wt 189.0 lb

## 2016-07-01 DIAGNOSIS — G35 Multiple sclerosis: Secondary | ICD-10-CM | POA: Diagnosis not present

## 2016-07-01 DIAGNOSIS — M5416 Radiculopathy, lumbar region: Secondary | ICD-10-CM

## 2016-07-01 MED ORDER — CYCLOBENZAPRINE HCL 5 MG PO TABS: 5.0000 mg | ORAL_TABLET | Freq: Two times a day (BID) | ORAL | 5 refills | Status: DC | PRN

## 2016-07-01 NOTE — Progress Notes (Signed)
[facility]  PATIENT: Willie Lucas DOB: 02-20-1966     HISTORICAL  CHIEF COMPLAINT:  Chief Complaint  Patient presents with  . Back Pain    Increased back, right leg pain/fim    HISTORY OF PRESENT ILLNESS:  Willie Lucas is a 51 year old man with multiple sclerosis who reports pain in the right lower back for the past several days. Pain is worse with certain activities such as walking or lifting. 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 This pain is different from the pain he was experiencing last year. Specifically, there is not any significant radiation into the right leg as it was previously. He notes a nodular muscle spasm in the right lower lumbar region.  MRI in the past has shown disc herniation at L5-S1 towards the right with posterior displacement of the right S1 nerve root. There was also protrusion to the left at L4-L5.  He denies any difficulty with leg strength or numbness at this time. There is no change in bladder function.  He is very active and is exercising regularly including jogging.  His MS has been very stable in the last MRI did not show any new lesions. He denies any change in gait or balance. There is no numbness or weakness. Bladder function is fine. Vision is fine. He denies any significant problems with fatigue or sleep. He denies cognitive problems and he works in Engineer, mining.   ROS:  Out of a complete 14 system review of symptoms, the patient complains only of the following symptoms, and all other reviewed systems are negative.  Back  pain as above   ALLERGIES: No Known Allergies  HOME MEDICATIONS:  Current Outpatient Prescriptions:  .  albuterol (PROVENTIL HFA;VENTOLIN HFA) 108 (90 BASE) MCG/ACT inhaler, Inhale 1 puff into the lungs every 6 (six) hours as needed for wheezing or shortness of breath., Disp: 1 Inhaler, Rfl: 2 .  baclofen  (LIORESAL) 10 MG tablet, Take 1 tablet (10 mg total) by mouth 3 (three) times daily., Disp: 90 each, Rfl: 6 .  clonazePAM (KLONOPIN) 0.5 MG tablet, Take 1 tablet (0.5 mg total) by mouth 2 (two) times daily as needed for anxiety., Disp: 60 tablet, Rfl: 5 .  omeprazole (PRILOSEC) 40 MG capsule, Take 1 capsule (40 mg total) by mouth daily., Disp: 30 capsule, Rfl: 3 .  rizatriptan (MAXALT) 5 MG tablet, take 1 tablet by mouth if needed for migraines may repeat IN 2 hours if needed, Disp: 10 tablet, Rfl: 3 .  cyclobenzaprine (FLEXERIL) 5 MG tablet, Take 1 tablet (5 mg total) by mouth 2 (two) times daily as needed for muscle spasms., Disp: 60 tablet, Rfl: 5 No current facility-administered medications for this visit.   Facility-Administered Medications Ordered in Other Visits:  .  gadopentetate dimeglumine (MAGNEVIST) injection 19 mL, 19 mL, Intravenous, Once PRN, Britt Bottom, MD  PAST MEDICAL HISTORY: Past Medical History:  Diagnosis Date  . Anxiety   . Headache(784.0)   . MS (multiple sclerosis) ([place])   . Neuromuscular disorder ([place])   . Ulcer ([place])    hx of duodenal ulcer at age 47    PAST SURGICAL HISTORY: Past Surgical History:  Procedure Laterality Date  . RHINOPLASTY     X2    FAMILY HISTORY: Family History  Problem Relation Age of Onset  . Diabetes Mother   . Arthritis Mother   . Brain cancer Brother  died    SOCIAL HISTORY:  Social History   Social History  . Marital status: Married    Spouse name: N/A  . Number of children: N/A  . Years of education: N/A   Occupational History  . Music therapist    Social History Main Topics  . Smoking status: Former Research scientist (life sciences)  . Smokeless tobacco: Never Used  . Alcohol use 0.0 oz/week    2 - 3 Glasses of wine per week  . Drug use: No  . Sexual activity: Not on file   Other Topics Concern  . Not on file   Social History Narrative  . No narrative on file     PHYSICAL EXAM  Vitals:   07/01/16 1423  BP:  136/82  Pulse: 74  Resp: 14  Weight: 189 lb (85.7 kg)  Height: 5\' 2"  (1.575 m)    Body mass index is 34.57 kg/m.   General: The patient is well-developed and well-nourished and in no acute distress  Musculoskeletal:  Back and right piriformis are mildly tender.    + SLR on the right with only a few degrees movement  Neurologic Exam  Mental status: The patient is alert and oriented x 3 at the time of the examination. The patient has apparent normal recent and remote memory, with an apparently normal attention span and concentration ability.   Speech is normal.  Cranial nerves: Extraocular movements are full.   Facial symmetry is present.Facial strength is normal.   Motor:  Muscle bulk is normal.   Tone is normal. Strength is  5 / 5 in arms and left leg.    4+/5 in right toe extensors.   Sensory: Sensory testing is intact to pinprick, soft touch and vibration sensation in all 4 extremities.  Coordination: Cerebellar testing reveals good finger-nose-finger and heel-to-shin bilaterally.  Gait and station: Station is normal.   Gait is arthritic and he leans forward.   Reflexes: Deep tendon reflexes are symmetric and brisk bilaterally.     DIAGNOSTIC DATA (LABS, IMAGING, TESTING) - I reviewed patient records, labs, notes, testing and imaging myself where available.  Lab Results  Component Value Date   WBC 6.3 02/28/2013   HGB 15.5 02/28/2013   HCT 46.1 02/28/2013   MCV 93.9 02/28/2013   PLT 134.0 (L) 02/28/2013      Component Value Date/Time   NA 135 02/28/2013 0857   K 4.4 02/28/2013 0857   CL 102 02/28/2013 0857   CO2 26 02/28/2013 0857   GLUCOSE 92 02/28/2013 0857   BUN 19 02/28/2013 0857   CREATININE 0.9 02/28/2013 0857   CALCIUM 8.8 02/28/2013 0857   PROT 6.9 04/19/2013 0819   ALBUMIN 4.3 04/19/2013 0819   AST 27 04/19/2013 0819   ALT 24 04/19/2013 0819   ALKPHOS 78 04/19/2013 0819   BILITOT 1.0 04/19/2013 0819   GFRNONAA 93.30 09/02/2009 0909   Lab  Results  Component Value Date   CHOL 223 (H) 04/24/2014   HDL 57.60 04/24/2014   LDLCALC 131 (H) 04/24/2014   LDLDIRECT 176.8 02/28/2013   TRIG 171.0 (H) 04/24/2014   CHOLHDL 4 04/24/2014   No results found for: HGBA1C No results found for: VITAMINB12 Lab Results  Component Value Date   TSH 1.11 02/28/2013   _____________________________________  PROCEDURE Epidural Steroid Injection at L4L5  The risks, alternatives and benefits of an epidural steroid injection was discussed with her. The back was prepped and draped. The skin over the L4L5 interspace was numbed with 1% lidocaine.  An 18-gauge Touhy needle was slowly advanced. Using the loss of resistance technique with sterile saline, the epidural space was identified.  160 Milligrams of Depo-Medrol was mixed into 4 ml 0.25% lidocaine and was slowly injected. The needle was removed intact. The patient tolerated the procedure well.       ASSESSMENT AND PLAN  Lumbar radicular pain  MULTIPLE SCLEROSIS   1.   Trigger point injections into lower lumbar paraspinal muscles and the more lateral spasm on the right with 80 mg Depo-Medrol in 4 mL Marcaine. He tolerated the procedure well and pain was better afterwards. 2.   Stay active and exercise as tolerated.  3.   Change baclofen to cyclobenzaprine 5 mg every p.m. and daily at bedtime. 4.    His MS has been extremely stable and did not show any new lesions. He will remain off of a disease modifying therapy at this time.  5.   Return in 6 months or sooner if there are new or worsening neurologic symptoms.   Richard A. Felecia Shelling, MD, PhD 6/72/8979, 1:50 PM Certified in Neurology, Clinical Neurophysiology, Sleep Medicine, Pain Medicine and Neuroimaging  [facility] [address], [place], [place] [address] [phone]

## 2016-07-01 NOTE — Telephone Encounter (Signed)
I have spoken with Nles this morning. RAS does not have an appt. open today, but can see him at 1430 tomorrow.  Pt. sts. he is having right sided sciatica, not able to come tomorrow, and even if RAS is not able do esi today, he would like to come in to see what tx. plan is rec. He will arrive at 1345 today and will work him in asap/fim

## 2016-07-01 NOTE — Telephone Encounter (Signed)
Patient called office in reference to having bulging disk in lower back causing back spasms.  Patient requesting to be seen today.  Please call

## 2016-08-11 ENCOUNTER — Other Ambulatory Visit: Payer: Self-pay | Admitting: Nurse Practitioner

## 2016-08-11 ENCOUNTER — Other Ambulatory Visit: Payer: Self-pay | Admitting: Neurology

## 2016-08-19 ENCOUNTER — Telehealth: Payer: Self-pay | Admitting: Neurology

## 2016-08-19 DIAGNOSIS — R269 Unspecified abnormalities of gait and mobility: Secondary | ICD-10-CM

## 2016-08-19 DIAGNOSIS — G35 Multiple sclerosis: Secondary | ICD-10-CM

## 2016-08-19 NOTE — Telephone Encounter (Signed)
I have spoken with Willie Lucas.  He is requesting MRI brain and c/spine to check status of MS.  Last mri's were in 2016.  He has c/o recent episode of "vibration" sensation that he had when first dx. with MS. MRI's ordered, pt. feels he will be ok to have these in the mobile unit, despite smaller size of MRI unit./fim

## 2016-08-19 NOTE — Addendum Note (Signed)
Addended by: France Ravens I on: 08/19/2016 05:07 PM   Modules accepted: Orders

## 2016-08-19 NOTE — Telephone Encounter (Signed)
Patient calling to get an order for an MRI.

## 2016-08-20 NOTE — Telephone Encounter (Signed)
I left a voicemail for patient to call me back about scheduling his MRI.

## 2016-09-02 ENCOUNTER — Other Ambulatory Visit: Payer: BLUE CROSS/BLUE SHIELD

## 2016-09-18 ENCOUNTER — Ambulatory Visit (INDEPENDENT_AMBULATORY_CARE_PROVIDER_SITE_OTHER): Payer: BLUE CROSS/BLUE SHIELD | Admitting: Physician Assistant

## 2016-09-18 ENCOUNTER — Encounter: Payer: Self-pay | Admitting: Physician Assistant

## 2016-09-18 VITALS — BP 121/78 | HR 65 | Temp 98.2°F | Resp 16 | Ht 62.0 in | Wt 189.0 lb

## 2016-09-18 DIAGNOSIS — H9193 Unspecified hearing loss, bilateral: Secondary | ICD-10-CM

## 2016-09-18 DIAGNOSIS — G35 Multiple sclerosis: Secondary | ICD-10-CM | POA: Diagnosis not present

## 2016-09-18 MED ORDER — PREDNISONE 50 MG PO TABS: 50.0000 mg | ORAL_TABLET | Freq: Every day | ORAL | 0 refills | Status: AC

## 2016-09-18 NOTE — Progress Notes (Signed)
09/22/2016 9:18 AM   DOB: 1966/01/04 / MRN: [id number]  SUBJECTIVE:  Willie Lucas is a 51 y.o. male presenting for a 20 second loss of hearing that occurred last night.  He has a known history of MS and has neuro follow up in about 1 month that will include an MRI.  He has no history of HTN, no hx of diabetes or significant dyslipidemia.  He has No Known Allergies.   He  has a past medical history of Anxiety; Headache(784.0); MS (multiple sclerosis) ([place]); Neuromuscular disorder ([place]); and Ulcer.    He  reports that he has quit smoking. He has never used smokeless tobacco. He reports that he drinks alcohol. He reports that he does not use drugs. He  has no sexual activity history on file. The patient  has a past surgical history that includes Rhinoplasty.  His family history includes Arthritis in his mother; Brain cancer in his brother; Diabetes in his mother.  Review of Systems  Constitutional: Negative for chills, diaphoresis and fever.  Eyes: Negative.   Respiratory: Negative for cough, hemoptysis, sputum production, shortness of breath and wheezing.   Cardiovascular: Negative for chest pain, orthopnea and leg swelling.  Gastrointestinal: Negative for nausea.  Skin: Negative for rash.  Neurological: Negative for dizziness, tingling, tremors, sensory change, speech change, focal weakness, seizures, loss of consciousness and headaches.    The problem list and medications were reviewed and updated by myself where necessary and exist elsewhere in the encounter.   OBJECTIVE:  BP 121/78 (BP Location: Right Arm, Patient Position: Sitting, Cuff Size: Normal)   Pulse 65   Temp 98.2 F (36.8 C) (Oral)   Resp 16   Ht 5\' 2"  (1.575 m)   Wt 189 lb (85.7 kg)   SpO2 97%   BMI 34.57 kg/m   Physical Exam  Constitutional: He is oriented to person, place, and time. He appears well-developed. He is active and cooperative.  Non-toxic appearance.  HENT:  Right Ear: Hearing, tympanic  membrane, external ear and ear canal normal.  Left Ear: Hearing, tympanic membrane, external ear and ear canal normal.  Ears:  Nose: Nose normal. Right sinus exhibits no maxillary sinus tenderness and no frontal sinus tenderness. Left sinus exhibits no maxillary sinus tenderness and no frontal sinus tenderness.  Mouth/Throat: Uvula is midline, oropharynx is clear and moist and mucous membranes are normal. No oropharyngeal exudate, posterior oropharyngeal edema or tonsillar abscesses.  Eyes: Conjunctivae and EOM are normal. Pupils are equal, round, and reactive to light.  Cardiovascular: Normal rate.   Pulmonary/Chest: Effort normal. No tachypnea.  Lymphadenopathy:       Head (right side): No submandibular and no tonsillar adenopathy present.       Head (left side): No submandibular and no tonsillar adenopathy present.    He has no cervical adenopathy.  Neurological: He is alert and oriented to person, place, and time. He has normal strength and normal reflexes. He is not disoriented. He displays no atrophy, no tremor and normal reflexes. No cranial nerve deficit or sensory deficit. He exhibits normal muscle tone. He displays a negative Romberg sign. He displays no seizure activity. Coordination and gait normal.  Cerebellar testing intact to challenge.   Skin: Skin is warm and dry. He is not diaphoretic. No pallor.  Psychiatric: His behavior is normal.  Vitals reviewed.   Lab Results  Component Value Date   CHOL 223 (H) 04/24/2014   HDL 57.60 04/24/2014   LDLCALC 131 (H) 04/24/2014  LDLDIRECT 176.8 02/28/2013   TRIG 171.0 (H) 04/24/2014   CHOLHDL 4 04/24/2014     No results found for this or any previous visit (from the past 72 hour(s)).  No results found.  ASSESSMENT AND PLAN:  Alanson was seen today for hearing loss.  Diagnoses and all orders for this visit:  Bilateral hearing loss, unspecified hearing loss type: Given a documented history of problem two and a completely  normal exam today he would benefit from prednisone to stop an MS exacerbation.  Will start today for 5 days.  He will check in with his neurologist as well.  Case discussed with Dr. Brigitte Pulse and she is in agreement.  -     predniSONE (DELTASONE) 50 MG tablet; Take 1 tablet (50 mg total) by mouth daily with breakfast.  Multiple sclerosis ([place]): See problem one.     The patient is advised to call or return to clinic if he does not see an improvement in symptoms, or to seek the care of the closest emergency department if he worsens with the above plan.   Philis Fendt, MHS, PA-C [facility] 09/22/2016 9:18 AM

## 2016-09-18 NOTE — Patient Instructions (Addendum)
  Call your neurologist.    IF you received an x-ray today, you will receive an invoice from [facility] Rad[facility]. Please contact [facility]iology at [phone] with questions or concerns regarding your invoice.   IF you received labwork today, you will receive an invoice from [place]. Please contact [facility] at [phone] with questions or concerns regarding your invoice.   Our billing staff will not be able to assist you with questions regarding bills from these companies.  You will be contacted with the lab results as soon as they are available. The fastest way to get your results is to activate your My Chart account. Instructions are located on the last page of this paperwork. If you have not heard from [place] regarding the results in 2 weeks, please contact this office.

## 2016-11-03 DIAGNOSIS — G379 Demyelinating disease of central nervous system, unspecified: Secondary | ICD-10-CM | POA: Diagnosis not present

## 2016-11-03 DIAGNOSIS — M50922 Unspecified cervical disc disorder at C5-C6 level: Secondary | ICD-10-CM | POA: Diagnosis not present

## 2016-11-03 DIAGNOSIS — G35 Multiple sclerosis: Secondary | ICD-10-CM | POA: Diagnosis not present

## 2016-11-03 DIAGNOSIS — R9082 White matter disease, unspecified: Secondary | ICD-10-CM | POA: Diagnosis not present

## 2016-11-10 ENCOUNTER — Telehealth: Payer: Self-pay | Admitting: *Deleted

## 2016-11-10 NOTE — Telephone Encounter (Signed)
LMOM (identified vm) with below MRI report.  He does not need to return this call unless he has questions.  I spoke with Butch Penny at [facility] and requested cd's of MRI brain and MRI cervical spine be mailed to RAS at office address./fim

## 2016-11-10 NOTE — Telephone Encounter (Signed)
-----   Message from Britt Bottom, MD sent at 11/09/2016  7:47 PM EDT ----- Please let him know that the MRI of the brain was read as showing several MS lesions in the MRI of the cervical spine did not show any MS lesions.   I will try to get the actual images and compare them side by side to his older MRI's and we will let him know if there was anything new   Please see if we can get the MRIs from [facility] imaging in [place])

## 2016-11-13 ENCOUNTER — Telehealth: Payer: Self-pay | Admitting: Neurology

## 2016-11-13 MED ORDER — METHYLPREDNISOLONE 4 MG PO TBPK: ORAL_TABLET | ORAL | 0 refills | Status: DC

## 2016-11-13 NOTE — Telephone Encounter (Signed)
Patient called office in reference to MRI results.  Patient would like to know if he is able to get more details of his results.  Please call

## 2016-11-13 NOTE — Telephone Encounter (Signed)
LMOM (identiifed vm) Per RAS, ok for Medrol dose pk.  Rx. escribed to [facility] per pt's request.  He does not need to return this call unless he has questions/fim

## 2016-11-13 NOTE — Telephone Encounter (Signed)
Patient called office to see if he is able to have steroids called in due to having tingling in soft tissue areas of the body.  Symptoms have been present for 2 days.  Pharmacy- [facility]

## 2016-11-13 NOTE — Addendum Note (Signed)
Addended by: France Ravens I on: 11/13/2016 10:40 AM   Modules accepted: Orders

## 2016-11-13 NOTE — Telephone Encounter (Signed)
LMOM (identified vm) that Willie Lucas received old MRI for comparison; once he has compared MRI's, Willie Lucas will get a call regarding results./fim

## 2016-11-24 NOTE — Telephone Encounter (Signed)
Patient called in and would like a call back about the comparison of the 2 MRI's. Also stated he didn't know if he should do another round of the oral steroids.

## 2016-11-24 NOTE — Telephone Encounter (Signed)
LMOM (identified vm) that per RAS, no new lesions noted on recent MRI's when compared to the older MRI's.  He does not need to take more steroids now--if he is not feeling better, RAS would like to see him back in the office/fim

## 2016-11-25 ENCOUNTER — Ambulatory Visit (INDEPENDENT_AMBULATORY_CARE_PROVIDER_SITE_OTHER): Payer: BLUE CROSS/BLUE SHIELD | Admitting: Neurology

## 2016-11-25 ENCOUNTER — Encounter: Payer: Self-pay | Admitting: Neurology

## 2016-11-25 VITALS — BP 136/80 | HR 65 | Resp 14 | Ht 70.5 in | Wt 189.5 lb

## 2016-11-25 DIAGNOSIS — G35 Multiple sclerosis: Secondary | ICD-10-CM

## 2016-11-25 DIAGNOSIS — M5416 Radiculopathy, lumbar region: Secondary | ICD-10-CM | POA: Diagnosis not present

## 2016-11-25 DIAGNOSIS — G43009 Migraine without aura, not intractable, without status migrainosus: Secondary | ICD-10-CM

## 2016-11-25 DIAGNOSIS — R202 Paresthesia of skin: Secondary | ICD-10-CM | POA: Diagnosis not present

## 2016-11-25 NOTE — Patient Instructions (Signed)
Consider alpha lipoic acid 600 mg twice a day.

## 2016-11-25 NOTE — Progress Notes (Signed)
[facility]  PATIENT: Willie Lucas DOB: Aug 14, 1965  REFERRING DOCTOR OR PCP:  Benay Pillow SOURCE: Patient and records from [facility]  _________________________________   HISTORICAL  CHIEF COMPLAINT:  Chief Complaint  Patient presents with  . Multiple Sclerosis    Sts. he continues to have generalized numbness/tingling, that moves around--face, wrists, legs.  Worse in the afternoon and late evening.  Usually, oral steroids completely resolve paresthesias, but have not worked as well this time.  Sx. resolved while taking steroids, but returned upon completion of dose pk/fim    HISTORY OF PRESENT ILLNESS:  Willie Lucas is a 51 year old man who was diagnosed with multiple sclerosis around 2001 who also has migraine headaches and lumbar disc degenerative changes .  About 3 weeks ago, he had hearing loss while watching TV.   Hearing returned a few minutes later.     A few days later, he had tingling and light numbness in his fingers that would intensify and then be on legs and groin.    Oral steroids helped x several days anf then stymptoms returned.    MRI of The cervical spine showed a disc protrusion at C5-C6 but no spinal cord stenosis, spinal cord will sign or nerve root compression.   MRI of the brain showed a stable pattern of T2/FLAIR hyperintense foci in the periventricular and juxtacortical white matter. No change compared to 2016.   There is no weakness or clumsiness in the arms or legs. Her function is fine. Vision is fine.  Low back pain/lumbar radiculopathy: This is currently doing well but flares up once or twice a year, most recently several months ago.  When more severe, ESI has helped. MRI of the lumbar spine dated 08/01/2009 shows disc degenerative changes at L2-L3, L4-L5 and L5-S1. At L4-L5, there is a left paramedian disc protrusion with some encroachment on the left L5 nerve root. At L5-S1 right paramedian disc protrusion with some  encroachment on the S1 nerve root.  Migraine headaches: Migraines are doing better since starting clonazepam at bedtime.   When the pain is present it is throbbing, usually unilateral and over the eye. He gets photophobia and phonophobia and moving makes the pain worse. He gets some nausea but usually does not have vomiting.  Currently, he has one bad migraine a month.   He takes sublingual Maxalt 10 mg MLT with ibuprofen and is better within 30 minutes.    They were occurring 3-4 times a month before clonazepam.      Mood:   Anxiety and sleep are helped by clonazepam and he takes one every morning and a second one many afternoons      Multiple sclerosis History: I saw Mr. Willie Lucas a couple years after his diagnosis of multiple sclerosis. He has done very well. Initially he had several exacerbations with numbness and clumsiness and visual changes.[name] He placed on Copaxone and did not have any further exacerbations. About 5 or 6 years ago he went to every other day and year or 2 later he stopped the Copaxone. He has continued to do well with no definite exacerbations the last 5 years. An MRI was performed 09/08/2012.  It was compared to one dated 01/31/2008 andthere was only one new lesion in that 5 year interval.  There were no acute lesions on either study.  After his diagnosis he began to exercise on a very regular basis and continues to do so. He denies any significant sequela from the MS. Specifically, there is  no difficulty with his gait and he notes no numbness or weakness. He does not have bladder or visual dysfunction. His mood issues with anxiety are probably unrelated to the MS. He does not note much for fatigue and remains active and travels frequently.   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  REVIEW OF SYSTEMS: Constitutional: No fevers, chills, sweats, or change in appetite Eyes: No visual changes, double vision, eye pain Ear, nose and throat: No hearing loss, ear  pain, nasal congestion, sore throat Cardiovascular: No chest pain, palpitations Respiratory: No shortness of breath at rest or with exertion.   No wheezes GastrointestinaI: No nausea, vomiting, diarrhea, abdominal pain, fecal incontinence Genitourinary: No dysuria, urinary retention or frequency.  No nocturia. Musculoskeletal: See above  Integumentary: No rash, pruritus, skin lesions Neurological: as above Psychiatric: No depression at this time.  Notes anxiety Endocrine: No palpitations, diaphoresis, change in appetite, change in weigh or increased thirst Hematologic/Lymphatic: No anemia, purpura, petechiae. Allergic/Immunologic: No itchy/runny eyes, nasal congestion, recent allergic reactions, rashes  ALLERGIES: No Known Allergies  HOME MEDICATIONS:  Current Outpatient Prescriptions:  .  albuterol (PROVENTIL HFA;VENTOLIN HFA) 108 (90 BASE) MCG/ACT inhaler, Inhale 1 puff into the lungs every 6 (six) hours as needed for wheezing or shortness of breath., Disp: 1 Inhaler, Rfl: 2 .  clonazePAM (KLONOPIN) 0.5 MG tablet, take 1 tablet by mouth twice a day if needed for anxiety, Disp: 60 tablet, Rfl: 5 .  omeprazole (PRILOSEC) 40 MG capsule, Take 1 capsule (40 mg total) by mouth daily., Disp: 30 capsule, Rfl: 3 .  rizatriptan (MAXALT) 5 MG tablet, take 1 tablet by mouth if needed for migraines may repeat IN 2 hours if needed, Disp: 10 tablet, Rfl: 3 .  baclofen (LIORESAL) 10 MG tablet, Take 1 tablet (10 mg total) by mouth 3 (three) times daily. (Patient not taking: Reported on 11/25/2016), Disp: 90 each, Rfl: 6 .  cyclobenzaprine (FLEXERIL) 5 MG tablet, Take 1 tablet (5 mg total) by mouth 2 (two) times daily as needed for muscle spasms. (Patient not taking: Reported on 11/25/2016), Disp: 60 tablet, Rfl: 5 .  methylPREDNISolone (MEDROL DOSEPAK) 4 MG TBPK tablet, Take as directed (Patient not taking: Reported on 11/25/2016), Disp: 21 tablet, Rfl: 0 No current facility-administered medications for  this visit.   Facility-Administered Medications Ordered in Other Visits:  .  gadopentetate dimeglumine (MAGNEVIST) injection 19 mL, 19 mL, Intravenous, Once PRN, Tylin Stradley, Nanine Means, MD  PAST MEDICAL HISTORY: Past Medical History:  Diagnosis Date  . Anxiety   . Headache(784.0)   . MS (multiple sclerosis) ([place])   . Neuromuscular disorder ([place])   . Ulcer    hx of duodenal ulcer at age 39    PAST SURGICAL HISTORY: Past Surgical History:  Procedure Laterality Date  . RHINOPLASTY     X2    FAMILY HISTORY: Family History  Problem Relation Age of Onset  . Diabetes Mother   . Arthritis Mother   . Brain cancer Brother        died    SOCIAL HISTORY:  Social History   Social History  . Marital status: Married    Spouse name: N/A  . Number of children: N/A  . Years of education: N/A   Occupational History  . Music therapist    Social History Main Topics  . Smoking status: Former Research scientist (life sciences)  . Smokeless tobacco: Never Used  . Alcohol use 0.0 oz/week    2 - 3 Glasses of wine  per week  . Drug use: No  . Sexual activity: Not on file   Other Topics Concern  . Not on file   Social History Narrative  . No narrative on file     PHYSICAL EXAM  Vitals:   11/25/16 1150  BP: 136/80  Pulse: 65  Resp: 14  Weight: 189 lb 8 oz (86 kg)  Height: 5' 10.5" (1.791 m)    Body mass index is 26.81 kg/m.   General: The patient is well-developed and well-nourished and in mild acute distress  Neck: The neck is supple, no carotid bruits are noted.  The neck is nontender.  Skin:  Extremities are without significant edema.   Neurologic Exam  Mental status: The patient is alert and oriented x 3 at the time of the examination. The patient has apparent normal recent and remote memory, with an apparently normal attention span and concentration ability.   Speech is normal.  Cranial nerves: Extraocular movements are full. Pupils are equal, round, and reactive to light and  accomodation.  Visual fields are full.  Facial symmetry is present. Facial strength and sensation is normal. Patient's strength is normal.  The tongue is midline, and the patient has symmetric elevation of the soft palate. No obvious hearing deficits are noted.  Motor:  Muscle bulk is normal.   Tone is normal. Strength is  5 / 5 in all 4 extremities.   Sensory: Sensory testing is intact to touch, temperature and vibration in the arms and legs.   Coordination: Cerebellar testing reveals good finger-nose-finger and heel-to-shin bilaterally.  Gait and station: Station is normal.   Gait is normal. Tandem gait is normal. Romberg is negative.   Reflexes: Deep tendon reflexes are symmetric and normal bilaterally.    [name]    DIAGNOSTIC DATA (LABS, IMAGING, TESTING) - I reviewed patient records, labs, notes, testing and imaging myself where available.  Lab Results  Component Value Date   WBC 6.3 02/28/2013   HGB 15.5 02/28/2013   HCT 46.1 02/28/2013   MCV 93.9 02/28/2013   PLT 134.0 (L) 02/28/2013      Component Value Date/Time   NA 135 02/28/2013 0857   K 4.4 02/28/2013 0857   CL 102 02/28/2013 0857   CO2 26 02/28/2013 0857   GLUCOSE 92 02/28/2013 0857   BUN 19 02/28/2013 0857   CREATININE 0.9 02/28/2013 0857   CALCIUM 8.8 02/28/2013 0857   PROT 6.9 04/19/2013 0819   ALBUMIN 4.3 04/19/2013 0819   AST 27 04/19/2013 0819   ALT 24 04/19/2013 0819   ALKPHOS 78 04/19/2013 0819   BILITOT 1.0 04/19/2013 0819   GFRNONAA 93.30 09/02/2009 0909   Lab Results  Component Value Date   CHOL 223 (H) 04/24/2014   HDL 57.60 04/24/2014   LDLCALC 131 (H) 04/24/2014   LDLDIRECT 176.8 02/28/2013   TRIG 171.0 (H) 04/24/2014   CHOLHDL 4 04/24/2014   No results found for: HGBA1C No results found for: VITAMINB12 Lab Results  Component Value Date   TSH 1.11 02/28/2013     ASSESSMENT AND PLAN  MULTIPLE SCLEROSIS  Common migraine without intractability  Lumbar radicular  pain  Paresthesias   1.   Continue clonazepam at bedtime.  2.   If symptoms return he can have another Medrol Dosepak.  3.    Continue Maxalt for migraines.  He will return to see me in 6-12 months or sooner if he has new or worsening neurologic symptoms.   Atilla Zollner A. Felecia Shelling, MD, PhD  03/26/863, 7:84 PM Certified in Neurology, Clinical Neurophysiology, Sleep Medicine, Pain Medicine and Neuroimaging  [facility] [address], [place],  [address] [phone]

## 2016-12-07 ENCOUNTER — Telehealth: Payer: Self-pay | Admitting: Internal Medicine

## 2016-12-07 ENCOUNTER — Telehealth: Payer: Self-pay | Admitting: Neurology

## 2016-12-07 MED ORDER — METHYLPREDNISOLONE 4 MG PO TBPK: ORAL_TABLET | ORAL | 0 refills | Status: DC

## 2016-12-07 NOTE — Telephone Encounter (Signed)
Patient is calling to see if Dr. Felecia Shelling will call in Rx for another round of methylPREDNISolone (MEDROL DOSEPAK) 4 MG TBPK tablet. He still has tingling throughout his body. Please call to [facility] on [address].

## 2016-12-07 NOTE — Telephone Encounter (Signed)
Patient will come in tomorrow and see Nicoletta Ba PA at 8:45

## 2016-12-07 NOTE — Telephone Encounter (Signed)
Error in Provider

## 2016-12-07 NOTE — Addendum Note (Signed)
Addended by: France Ravens I on: 12/07/2016 04:35 PM   Modules accepted: Orders

## 2016-12-07 NOTE — Telephone Encounter (Signed)
Per RAS ok, Medrol dose pk. escribed to [facility] on [address] Pt. aware/fim

## 2016-12-08 ENCOUNTER — Other Ambulatory Visit (INDEPENDENT_AMBULATORY_CARE_PROVIDER_SITE_OTHER): Payer: BLUE CROSS/BLUE SHIELD

## 2016-12-08 ENCOUNTER — Telehealth: Payer: Self-pay | Admitting: *Deleted

## 2016-12-08 ENCOUNTER — Ambulatory Visit: Payer: Self-pay | Admitting: Physician Assistant

## 2016-12-08 ENCOUNTER — Encounter: Payer: Self-pay | Admitting: Physician Assistant

## 2016-12-08 ENCOUNTER — Ambulatory Visit (INDEPENDENT_AMBULATORY_CARE_PROVIDER_SITE_OTHER): Payer: BLUE CROSS/BLUE SHIELD | Admitting: Physician Assistant

## 2016-12-08 VITALS — BP 122/64 | HR 57 | Ht 70.0 in | Wt 189.0 lb

## 2016-12-08 DIAGNOSIS — K2990 Gastroduodenitis, unspecified, without bleeding: Secondary | ICD-10-CM

## 2016-12-08 DIAGNOSIS — Z86010 Personal history of colonic polyps: Secondary | ICD-10-CM

## 2016-12-08 DIAGNOSIS — Z1211 Encounter for screening for malignant neoplasm of colon: Secondary | ICD-10-CM | POA: Diagnosis not present

## 2016-12-08 DIAGNOSIS — R142 Eructation: Secondary | ICD-10-CM | POA: Diagnosis not present

## 2016-12-08 DIAGNOSIS — K2970 Gastritis, unspecified, without bleeding: Secondary | ICD-10-CM

## 2016-12-08 DIAGNOSIS — R1013 Epigastric pain: Secondary | ICD-10-CM | POA: Diagnosis not present

## 2016-12-08 DIAGNOSIS — R140 Abdominal distension (gaseous): Secondary | ICD-10-CM

## 2016-12-08 LAB — COMPREHENSIVE METABOLIC PANEL
ALT: 13 U/L (ref 0–53)
AST: 16 U/L (ref 0–37)
Albumin: 4.5 g/dL (ref 3.5–5.2)
Alkaline Phosphatase: 68 U/L (ref 39–117)
BUN: 18 mg/dL (ref 6–23)
CO2: 30 mEq/L (ref 19–32)
Calcium: 9.2 mg/dL (ref 8.4–10.5)
Chloride: 102 mEq/L (ref 96–112)
Creatinine, Ser: 0.99 mg/dL (ref 0.40–1.50)
GFR: 85.12 mL/min (ref >60.00)
Glucose, Bld: 86 mg/dL (ref 70–99)
Potassium: 4.2 mEq/L (ref 3.5–5.1)
Sodium: 138 mEq/L (ref 135–145)
Total Bilirubin: 0.6 mg/dL (ref 0.2–1.2)
Total Protein: 6.7 g/dL (ref 6.0–8.3)

## 2016-12-08 LAB — CBC WITH DIFFERENTIAL/PLATELET
Basophils Absolute: 0.1 10*3/uL (ref 0.0–0.1)
Basophils Relative: 0.9 % (ref 0.0–3.0)
Eosinophils Absolute: 0.1 10*3/uL (ref 0.0–0.7)
Eosinophils Relative: 2.3 % (ref 0.0–5.0)
HCT: 47.5 % (ref 39.0–52.0)
Hemoglobin: 15.9 g/dL (ref 13.0–17.0)
Lymphocytes Relative: 36.5 % (ref 12.0–46.0)
Lymphs Abs: 2.2 10*3/uL (ref 0.7–4.0)
MCHC: 33.5 g/dL (ref 30.0–36.0)
MCV: 94.7 fl (ref 78.0–100.0)
Monocytes Absolute: 0.4 10*3/uL (ref 0.1–1.0)
Monocytes Relative: 7.3 % (ref 3.0–12.0)
Neutro Abs: 3.2 10*3/uL (ref 1.4–7.7)
Neutrophils Relative %: 53 % (ref 43.0–77.0)
Platelets: 139 10*3/uL — ABNORMAL LOW (ref 150.0–400.0)
RBC: 5.01 Mil/uL (ref 4.22–5.81)
RDW: 12.4 % (ref 11.5–15.5)
WBC: 6.1 10*3/uL (ref 4.0–10.5)

## 2016-12-08 LAB — LIPASE: Lipase: 35 U/L (ref 11.0–59.0)

## 2016-12-08 LAB — SEDIMENTATION RATE: Sed Rate: 1 mm/hr (ref 0–15)

## 2016-12-08 MED ORDER — OMEPRAZOLE 40 MG PO CPDR: DELAYED_RELEASE_CAPSULE | ORAL | 1 refills | Status: DC

## 2016-12-08 MED ORDER — NA SULFATE-K SULFATE-MG SULF 17.5-3.13-1.6 GM/177ML PO SOLN: ORAL | 0 refills | Status: DC

## 2016-12-08 NOTE — Progress Notes (Signed)
Subjective:    Patient ID: Willie Lucas, male    DOB: 1965-12-16, 51 y.o.   MRN: [id number]  HPI Willie Lucas is a pleasant 51 year old white male known to Dr. Fuller Plan from previous procedures who comes in today to discuss follow-up colonoscopy and also with complaints of upper abdominal pain. Patient has history of MS and has had a recent flare. He has completed 1 course of a steroid Dosepak and is supposed to take another. He says that over the past 2-2-1/2 weeks she has been having ongoing problems with epigastric tenderness and a swollen gassy sensation primarily in his upper abdomen. His bowel movements have been normal, no melena or hematochezia. He has not had any nausea or vomiting but has had a lot of of belching. His symptoms started prior to taking the steroid Dosepak. He denies any regular aspirin or NSAID use, and has not had any change in diet, medications etc. He says everything he eats at this point is bothering his stomach and he is gotten to the point of just eating scrambled eggs and chicken and rice. Despite bland diet his symptoms persist. He has been on Prilosec 20 mg once daily long-term and has increase this to 20 mg twice daily over the past couple weeks without any improvement. No recent travel. He does have remote history of peptic ulcer disease. Last EGD in February 2013 with finding of multiple gastric polyps in the body of the stomach these were 2-5 mm in size otherwise negative exam. Biopsy showed fundic gland polyps and biopsies for H. pylori were negative. Colonoscopy done also February 2013 with finding of a 10 mm sessile serrated adenoma and a hyperplastic polyp. He was indicated for 5 year interval follow-up.  Review of Systems Pertinent positive and negative review of systems were noted in the above HPI section.  All other review of systems was otherwise negative.  Outpatient Encounter Prescriptions as of 12/08/2016  Medication Sig  . albuterol (PROVENTIL HFA;VENTOLIN  HFA) 108 (90 BASE) MCG/ACT inhaler Inhale 1 puff into the lungs every 6 (six) hours as needed for wheezing or shortness of breath.  . clonazePAM (KLONOPIN) 0.5 MG tablet take 1 tablet by mouth twice a day if needed for anxiety  . methylPREDNISolone (MEDROL DOSEPAK) 4 MG TBPK tablet Take as directed  . omeprazole (PRILOSEC) 40 MG capsule Take 1 tab twice daily, after one month take 1 tab once daily.  . rizatriptan (MAXALT) 5 MG tablet take 1 tablet by mouth if needed for migraines may repeat IN 2 hours if needed  . [DISCONTINUED] omeprazole (PRILOSEC) 40 MG capsule Take 1 capsule (40 mg total) by mouth daily.  . Na Sulfate-K Sulfate-Mg Sulf 17.5-3.13-1.6 GM/180ML SOLN Take as directed for colonoscopy.  . [DISCONTINUED] baclofen (LIORESAL) 10 MG tablet Take 1 tablet (10 mg total) by mouth 3 (three) times daily. (Patient not taking: Reported on 11/25/2016)  . [DISCONTINUED] cyclobenzaprine (FLEXERIL) 5 MG tablet Take 1 tablet (5 mg total) by mouth 2 (two) times daily as needed for muscle spasms. (Patient not taking: Reported on 11/25/2016)   Facility-Administered Encounter Medications as of 12/08/2016  Medication  . gadopentetate dimeglumine (MAGNEVIST) injection 19 mL   No Known Allergies Patient Active Problem List   Diagnosis Date Noted  . Paresthesias 11/25/2016  . Common migraine without intractability 07/04/2014  . Exercise-induced reactive airway disease 04/24/2014  . Pure hypercholesterolemia 03/08/2013  . SCIATICA 07/16/2009  . MUSCLE SPASM, LUMBOSACRAL REGION 07/16/2009  . Lumbar radicular pain 07/16/2009  . URI  04/30/2009  . MULTIPLE SCLEROSIS 03/28/2009  . ACUT PEPTC ULCR UNS SITE W/HEM W/O MENTION OBST 03/28/2009  . HEADACHE 03/28/2009   Social History   Social History  . Marital status: Married    Spouse name: N/A  . Number of children: N/A  . Years of education: N/A   Occupational History  . Music therapist    Social History Main Topics  . Smoking status: Former  Research scientist (life sciences)  . Smokeless tobacco: Never Used  . Alcohol use 0.0 oz/week    2 - 3 Glasses of wine per week     Comment: occ  . Drug use: No  . Sexual activity: Yes   Other Topics Concern  . Not on file   Social History Narrative  . No narrative on file    Willie Lucas's family history includes Arthritis in his mother; Brain cancer in his brother; Diabetes in his mother.      Objective:    Vitals:   12/08/16 1456  BP: 122/64  Pulse: (!) 57    Physical Exam  well-developed white male in no acute distress, very pleasant blood pressure 122/64 pulse 57, BMI 27.1. HEENT; nontraumatic normocephalic EOMI PERRLA sclera anicteric, Cardiovascular; regular rate and rhythm with S1-S2 no murmur or gallop, Pulmonary; clear bilaterally, Abdomen; soft, bowel sounds are present she is tender in the epigastrium there is no guarding or rebound no palpable mass or hepatosplenomegaly bowel sounds somewhat hyperactive, Rectal; exam not done, Extremities ;no clubbing cyanosis or edema skin warm and dry, Neuropsych; mood and affect appropriate       Assessment & Plan:   #59 51 year old white male with 2-3 week history of persistent epigastric pain, belching, gas and upper abdominal bloating. He has not had any improvement with twice a day omeprazole 20 mg. Rule out gastritis or peptic ulcer disease, rule out nonulcer dyspepsia, rule out possible viral syndrome #2 history of sessile serrated adenoma at colonoscopy February 2013, overdue for follow-up #3 history of gastric fundic gland polyps #4 multiple sclerosis with recent flare  Plan; Will increase omeprazole to 40 mg by mouth twice a day for 1 month then decrease to 40 mg once daily Check CBC with differential, CMET, lipase, Patient will be given a trial of GI cocktail 30 mL 3-4 times daily as needed Continue bland diet and no EtOH Will schedule for EGD and colonoscopy with Dr. Fuller Plan. Both procedures discussed in detail with patient including risks and  benefits and he is agreeable to proceed. If GI symptoms have completely resolved prior to EGD he was advised he can call back and we can consider canceling the endoscopy.  Amy S Esterwood PA-C 12/08/2016   Cc: Wendie Agreste, MD

## 2016-12-08 NOTE — Patient Instructions (Addendum)
Please go to the basement level to have your labs drawn.  We have sent the following medications to your pharmacy for you to pick up at your convenience:[facility].  1. Prilosec ( Omeprazole) 40 mg 2. GI Cocktail  You have been scheduled for a colonoscopy. Please follow written instructions given to you at your visit today.  Please pick up your prep supplies at the pharmacy within the next 1-3 days.[facility] [address]  If you use inhalers (even only as needed), please bring them with you on the day of your procedure. Your physician has requested that you go to www.startemmi.com and enter the access code given to you at your visit today. This web site gives a general overview about your procedure. However, you should still follow specific instructions given to you by our office regarding your preparation for the procedure.

## 2016-12-08 NOTE — Telephone Encounter (Signed)
Informed the patient that we had to send the Magic Mouthwash to [facility].  They will be giving him a call when it is ready within the next 2 days.  They had to order one of the ingredientsl

## 2016-12-08 NOTE — Progress Notes (Signed)
Reviewed and agree with initial management plan.  Jezabella Schriever T. Jeriann Sayres, MD FACG 

## 2017-01-12 DIAGNOSIS — S63521A Sprain of radiocarpal joint of right wrist, initial encounter: Secondary | ICD-10-CM | POA: Diagnosis not present

## 2017-01-20 DIAGNOSIS — S63521D Sprain of radiocarpal joint of right wrist, subsequent encounter: Secondary | ICD-10-CM | POA: Diagnosis not present

## 2017-01-21 ENCOUNTER — Encounter: Payer: Self-pay | Admitting: Gastroenterology

## 2017-01-27 ENCOUNTER — Encounter: Payer: Self-pay | Admitting: Gastroenterology

## 2017-01-27 ENCOUNTER — Ambulatory Visit (AMBULATORY_SURGERY_CENTER): Payer: BLUE CROSS/BLUE SHIELD | Admitting: Gastroenterology

## 2017-01-27 VITALS — BP 116/71 | HR 57 | Temp 97.8°F | Resp 13 | Ht 70.0 in | Wt 189.0 lb

## 2017-01-27 DIAGNOSIS — Z86010 Personal history of colonic polyps: Secondary | ICD-10-CM

## 2017-01-27 DIAGNOSIS — R1013 Epigastric pain: Secondary | ICD-10-CM | POA: Diagnosis not present

## 2017-01-27 DIAGNOSIS — K317 Polyp of stomach and duodenum: Secondary | ICD-10-CM

## 2017-01-27 MED ORDER — SODIUM CHLORIDE 0.9 % IV SOLN: 500.0000 mL | INTRAVENOUS | Status: DC

## 2017-01-27 MED ORDER — DICYCLOMINE HCL 10 MG PO CAPS: 10.0000 mg | ORAL_CAPSULE | Freq: Three times a day (TID) | ORAL | 11 refills | Status: DC

## 2017-01-27 NOTE — Progress Notes (Signed)
Report to PACU, RN, vss, BBS= Clear.  

## 2017-01-27 NOTE — Op Note (Signed)
[place] Patient Name: Willie Lucas Procedure Date: 01/27/2017 1:43 PM MRN: [id number] Endoscopist: Ladene Artist , MD Age: 51 Referring MD:  Date of Birth: 1965-05-26 Gender: Male Account #: [id number] Procedure:                Upper GI endoscopy Indications:              Epigastric abdominal pain Medicines:                Monitored Anesthesia Care Procedure:                Pre-Anesthesia Assessment:                           - Prior to the procedure, a History and Physical                            was performed, and patient medications and                            allergies were reviewed. The patient's tolerance of                            previous anesthesia was also reviewed. The risks                            and benefits of the procedure and the sedation                            options and risks were discussed with the patient.                            All questions were answered, and informed consent                            was obtained. Prior Anticoagulants: The patient has                            taken no previous anticoagulant or antiplatelet                            agents. ASA Grade Assessment: II - A patient with                            mild systemic disease. After reviewing the risks                            and benefits, the patient was deemed in                            satisfactory condition to undergo the procedure.                           After obtaining informed consent, the endoscope was  passed under direct vision. Throughout the                            procedure, the patient's blood pressure, pulse, and                            oxygen saturations were monitored continuously. The                            Endoscope was introduced through the mouth, and                            advanced to the second part of duodenum. The upper                            GI endoscopy was accomplished  without difficulty.                            The patient tolerated the procedure well. Scope In: Scope Out: Findings:                 The examined esophagus was normal.                           Multiple 4 to 7 mm sessile polyps with no bleeding                            and no stigmata of recent bleeding were found in                            the gastric fundus and in the gastric body.                            Biopsies were taken with a cold forceps for                            histology.                           The exam of the stomach was otherwise normal.                           The duodenal bulb and second portion of the                            duodenum were normal. Complications:            No immediate complications. Estimated Blood Loss:     Estimated blood loss was minimal. Impression:               - Normal esophagus.                           - Multiple gastric polyps. Biopsied.                           -  Normal duodenal bulb and second portion of the                            duodenum. Recommendation:           - Patient has a contact number available for                            emergencies. The signs and symptoms of potential                            delayed complications were discussed with the                            patient. Return to normal activities tomorrow.                            Written discharge instructions were provided to the                            patient.                           - Resume previous diet. Avoid foods that trigger                            diarrhea.                           - Continue present medications.                           - Dicyclomine 10 mg po tid ac, 1 year of refills                           - Return to GI office in 6-8 weeks. Ladene Artist, MD 01/27/2017 2:19:48 PM This report has been signed electronically.

## 2017-01-27 NOTE — Patient Instructions (Signed)
YOU HAD AN ENDOSCOPIC PROCEDURE TODAY AT [facility]:   Refer to the procedure report that was given to you for any specific questions about what was found during the examination.  If the procedure report does not answer your questions, please call your gastroenterologist to clarify.  If you requested that your care partner not be given the details of your procedure findings, then the procedure report has been included in a sealed envelope for you to review at your convenience later.  YOU SHOULD EXPECT: Some feelings of bloating in the abdomen. Passage of more gas than usual.  Walking can help get rid of the air that was put into your GI tract during the procedure and reduce the bloating. If you had a lower endoscopy (such as a colonoscopy or flexible sigmoidoscopy) you may notice spotting of blood in your stool or on the toilet paper. If you underwent a bowel prep for your procedure, you may not have a normal bowel movement for a few days.  Please Note:  You might notice some irritation and congestion in your nose or some drainage.  This is from the oxygen used during your procedure.  There is no need for concern and it should clear up in a day or so.  SYMPTOMS TO REPORT IMMEDIATELY:   Following lower endoscopy (colonoscopy or flexible sigmoidoscopy):  Excessive amounts of blood in the stool  Significant tenderness or worsening of abdominal pains  Swelling of the abdomen that is new, acute  Fever of 100F or higher   Following upper endoscopy (EGD)  Vomiting of blood or coffee ground material  New chest pain or pain under the shoulder blades  Painful or persistently difficult swallowing  New shortness of breath  Fever of 100F or higher  Black, tarry-looking stools  For urgent or emergent issues, a gastroenterologist can be reached at any hour by calling [phone].   DIET:  We do recommend a small meal at first, but then you may proceed to your regular diet.  Drink  plenty of fluids but you should avoid alcoholic beverages for 24 hours.  ACTIVITY:  You should plan to take it easy for the rest of today and you should NOT DRIVE or use heavy machinery until tomorrow (because of the sedation medicines used during the test).    FOLLOW UP: Our staff will call the number listed on your records the next business day following your procedure to check on you and address any questions or concerns that you may have regarding the information given to you following your procedure. If we do not reach you, we will leave a message.  However, if you are feeling well and you are not experiencing any problems, there is no need to return our call.  We will assume that you have returned to your regular daily activities without incident.  If any biopsies were taken you will be contacted by phone or by letter within the next 1-3 weeks.  Please call us at [phone] if you have not heard about the biopsies in 3 weeks.    SIGNATURES/CONFIDENTIALITY: You and/or your care partner have signed paperwork which will be entered into your electronic medical record.  These signatures attest to the fact that that the information above on your After Visit Summary has been reviewed and is understood.  Full responsibility of the confidentiality of this discharge information lies with you and/or your care-partner.   Resume medications. Information given on diverticulosis and hemorrhoids.

## 2017-01-27 NOTE — Progress Notes (Signed)
Pt's states no medical or surgical changes since previsit or office visit.No international travel within 21 days.

## 2017-01-27 NOTE — Op Note (Signed)
[place] Patient Name: Willie Lucas Procedure Date: 01/27/2017 1:43 PM MRN: [id number] Endoscopist: Ladene Artist , MD Age: 51 Referring MD:  Date of Birth: July 20, 1965 Gender: Male Account #: [id number] Procedure:                Colonoscopy Indications:              Surveillance: Personal history of adenomatous                            polyps on last colonoscopy 5 years ago Medicines:                Monitored Anesthesia Care Procedure:                Pre-Anesthesia Assessment:                           - Prior to the procedure, a History and Physical                            was performed, and patient medications and                            allergies were reviewed. The patient's tolerance of                            previous anesthesia was also reviewed. The risks                            and benefits of the procedure and the sedation                            options and risks were discussed with the patient.                            All questions were answered, and informed consent                            was obtained. Prior Anticoagulants: The patient has                            taken no previous anticoagulant or antiplatelet                            agents. ASA Grade Assessment: II - A patient with                            mild systemic disease. After reviewing the risks                            and benefits, the patient was deemed in                            satisfactory condition to undergo the procedure.  After obtaining informed consent, the colonoscope                            was passed under direct vision. Throughout the                            procedure, the patient's blood pressure, pulse, and                            oxygen saturations were monitored continuously. The                            Model PCF-[id number]) scope was introduced                            through the anus and  advanced to the the cecum,                            identified by appendiceal orifice and ileocecal                            valve. The ileocecal valve, appendiceal orifice,                            and rectum were photographed. The quality of the                            bowel preparation was excellent. The colonoscopy                            was performed without difficulty. The patient                            tolerated the procedure well. Scope In: 1:48:48 PM Scope Out: 2:04:46 PM Scope Withdrawal Time: 0 hours 13 minutes 27 seconds  Total Procedure Duration: 0 hours 15 minutes 58 seconds  Findings:                 The perianal and digital rectal examinations were                            normal.                           Internal hemorrhoids were found during                            retroflexion. The hemorrhoids were small and Grade                            I (internal hemorrhoids that do not prolapse).                           A few medium-mouthed diverticula were found in the  left colon. There was no evidence of diverticular                            bleeding.                           The exam was otherwise without abnormality on                            direct and retroflexion views. Complications:            No immediate complications. Estimated blood loss:                            None. Estimated Blood Loss:     Estimated blood loss: none. Impression:               - Internal hemorrhoids.                           - Mild diverticulosis in the left colon. There was                            no evidence of diverticular bleeding.                           - The examination was otherwise normal on direct                            and retroflexion views.                           - No specimens collected. Recommendation:           - Repeat colonoscopy in 5 years for surveillance.                           - Patient has a  contact number available for                            emergencies. The signs and symptoms of potential                            delayed complications were discussed with the                            patient. Return to normal activities tomorrow.                            Written discharge instructions were provided to the                            patient.                           - Resume previous diet.                           -  Continue present medications. Ladene Artist, MD 01/27/2017 2:15:21 PM This report has been signed electronically.

## 2017-01-27 NOTE — Progress Notes (Signed)
Called to room to assist during endoscopic procedure.  Patient ID and intended procedure confirmed with present staff. Received instructions for my participation in the procedure from the performing physician.  

## 2017-01-28 ENCOUNTER — Telehealth: Payer: Self-pay | Admitting: *Deleted

## 2017-01-28 NOTE — Telephone Encounter (Signed)
  Follow up Call-  Call back number 01/27/2017  Post procedure Call Back phone  # [phone]  Permission to leave phone message Yes  Some recent data might be hidden      Patient questions:  Do you have a fever, pain , or abdominal swelling? No. Pain Score  0 *  Have you tolerated food without any problems? Yes.    Have you been able to return to your normal activities? Yes.    Do you have any questions about your discharge instructions: Diet   No. Medications  No. Follow up visit  No.  Do you have questions or concerns about your Care? No.  Actions: * If pain score is 4 or above: No action needed, pain <4.

## 2017-02-16 ENCOUNTER — Encounter: Payer: Self-pay | Admitting: Gastroenterology

## 2017-04-02 ENCOUNTER — Encounter (INDEPENDENT_AMBULATORY_CARE_PROVIDER_SITE_OTHER): Payer: Self-pay

## 2017-04-02 ENCOUNTER — Ambulatory Visit (INDEPENDENT_AMBULATORY_CARE_PROVIDER_SITE_OTHER): Payer: BLUE CROSS/BLUE SHIELD | Admitting: Gastroenterology

## 2017-04-02 ENCOUNTER — Encounter: Payer: Self-pay | Admitting: Gastroenterology

## 2017-04-02 VITALS — BP 106/64 | HR 64 | Ht 70.0 in | Wt 186.4 lb

## 2017-04-02 DIAGNOSIS — Z86010 Personal history of colonic polyps: Secondary | ICD-10-CM

## 2017-04-02 DIAGNOSIS — K588 Other irritable bowel syndrome: Secondary | ICD-10-CM

## 2017-04-02 DIAGNOSIS — K219 Gastro-esophageal reflux disease without esophagitis: Secondary | ICD-10-CM

## 2017-04-02 NOTE — Progress Notes (Signed)
    History of Present Illness: This is a 52 year old male returning for follow-up of IBS.  EGD and colonoscopy performed in November 2018 remarkable for diverticulosis, internal hemorrhoids and benign fundic gland polyps.  He reports that his symptoms have been under excellent control with dicyclomine and omeprazole.  He had an episode of dyspepsia, crampy painabdominal after eating out at [employer].  He feels it was the [facility].   Current Medications, Allergies, Past Medical History, Past Surgical History, Family History and Social History were reviewed in [employer] record.  Physical Exam: General: Well developed, well nourished, no acute distress Head: Normocephalic and atraumatic Eyes:  sclerae anicteric, EOMI Ears: Normal auditory acuity Mouth: No deformity or lesions Lungs: Clear throughout to auscultation Heart: Regular rate and rhythm; no murmurs, rubs or bruits Abdomen: Soft, non tender and non distended. No masses, hepatosplenomegaly or hernias noted. Normal Bowel sounds Musculoskeletal: Symmetrical with no gross deformities  Pulses:  Normal pulses noted Extremities: No clubbing, cyanosis, edema or deformities noted Neurological: Alert oriented x 4, grossly nonfocal Psychological:  Alert and cooperative. Normal mood and affect  Assessment and Recommendations:  1. IBS.  Avoid foods that trigger symptoms.  Continue dicyclomine 10 mg 3 times daily ac prn.  GI cocktail as needed for refractory symptoms. REV in 1 year.   2. GERD.  Avoid foods that trigger symptoms.  Continue omeprazole 40 mg daily and standard antireflux measures. REV in 1 year.  3.  Personal history of adenomatous colon polyps.  A 5-year interval surveillance colonoscopy is recommended in November 2023.

## 2017-04-02 NOTE — Patient Instructions (Signed)
Thank you for choosing me and [facility].  Malcolm T. Stark, Jr., MD., FACG  

## 2017-04-13 ENCOUNTER — Other Ambulatory Visit: Payer: Self-pay | Admitting: Physician Assistant

## 2017-04-15 ENCOUNTER — Telehealth: Payer: Self-pay | Admitting: *Deleted

## 2017-04-15 NOTE — Telephone Encounter (Signed)
LM for the patient that we sent 1 years worth of refills for the Omeprazole 40 mg once daily dosing per Dr. Lynne Leader office note.

## 2017-08-26 DIAGNOSIS — M9901 Segmental and somatic dysfunction of cervical region: Secondary | ICD-10-CM | POA: Diagnosis not present

## 2017-08-26 DIAGNOSIS — M9902 Segmental and somatic dysfunction of thoracic region: Secondary | ICD-10-CM | POA: Diagnosis not present

## 2017-08-26 DIAGNOSIS — G43009 Migraine without aura, not intractable, without status migrainosus: Secondary | ICD-10-CM | POA: Diagnosis not present

## 2017-08-26 DIAGNOSIS — M542 Cervicalgia: Secondary | ICD-10-CM | POA: Diagnosis not present

## 2017-09-13 DIAGNOSIS — M25561 Pain in right knee: Secondary | ICD-10-CM | POA: Diagnosis not present

## 2017-09-16 ENCOUNTER — Other Ambulatory Visit: Payer: Self-pay | Admitting: Neurology

## 2017-09-17 DIAGNOSIS — M25561 Pain in right knee: Secondary | ICD-10-CM | POA: Diagnosis not present

## 2018-02-07 ENCOUNTER — Telehealth: Payer: Self-pay | Admitting: Neurology

## 2018-02-07 ENCOUNTER — Other Ambulatory Visit: Payer: Self-pay | Admitting: Neurology

## 2018-02-07 MED ORDER — RIZATRIPTAN BENZOATE 5 MG PO TABS: ORAL_TABLET | ORAL | 0 refills | Status: DC

## 2018-02-07 NOTE — Telephone Encounter (Signed)
Pt has scheduled his 1 year f/u and is now requesting his refill on his rizatriptan (MAXALT) 5 MG tablet [facility] [phone]

## 2018-02-07 NOTE — Telephone Encounter (Signed)
Spoke with Dr. Felecia Shelling ok to refill

## 2018-02-07 NOTE — Addendum Note (Signed)
Addended by: Hope Pigeon on: 02/07/2018 04:27 PM   Modules accepted: Orders

## 2018-02-27 ENCOUNTER — Encounter (HOSPITAL_COMMUNITY): Payer: Self-pay | Admitting: Pharmacy Technician

## 2018-02-27 ENCOUNTER — Other Ambulatory Visit: Payer: Self-pay | Admitting: Physician Assistant

## 2018-02-27 ENCOUNTER — Emergency Department (HOSPITAL_COMMUNITY)
Admission: EM | Admit: 2018-02-27 | Discharge: 2018-02-27 | Disposition: A | Discharge status: Home / Self Care | Payer: BLUE CROSS/BLUE SHIELD | Attending: Family Medicine | Admitting: Family Medicine

## 2018-02-27 ENCOUNTER — Emergency Department (HOSPITAL_COMMUNITY): Payer: BLUE CROSS/BLUE SHIELD

## 2018-02-27 DIAGNOSIS — R200 Anesthesia of skin: Secondary | ICD-10-CM | POA: Diagnosis not present

## 2018-02-27 DIAGNOSIS — M545 Low back pain, unspecified: Secondary | ICD-10-CM

## 2018-02-27 LAB — CBC WITH DIFFERENTIAL/PLATELET
Abs Immature Granulocytes: 0.01 10*3/uL (ref 0.00–0.07)
Basophils Absolute: 0.1 10*3/uL (ref 0.0–0.1)
Basophils Relative: 1 %
Eosinophils Absolute: 0.1 10*3/uL (ref 0.0–0.5)
Eosinophils Relative: 2 %
HCT: 48.3 % (ref 39.0–52.0)
Hemoglobin: 16 g/dL (ref 13.0–17.0)
Immature Granulocytes: 0 %
Lymphocytes Relative: 35 %
Lymphs Abs: 2.2 10*3/uL (ref 0.7–4.0)
MCH: 30 pg (ref 26.0–34.0)
MCHC: 33.1 g/dL (ref 30.0–36.0)
MCV: 90.4 fL (ref 80.0–100.0)
Monocytes Absolute: 0.5 10*3/uL (ref 0.1–1.0)
Monocytes Relative: 8 %
Neutro Abs: 3.4 10*3/uL (ref 1.7–7.7)
Neutrophils Relative %: 54 %
Platelets: 151 10*3/uL (ref 150–400)
RBC: 5.34 MIL/uL (ref 4.22–5.81)
RDW: 11.5 % (ref 11.5–15.5)
WBC: 6.2 10*3/uL (ref 4.0–10.5)
nRBC: 0 % (ref 0.0–0.2)

## 2018-02-27 LAB — BASIC METABOLIC PANEL
Anion gap: 10 (ref 5–15)
BUN: 13 mg/dL (ref 6–20)
CO2: 24 mmol/L (ref 22–32)
Calcium: 9.1 mg/dL (ref 8.9–10.3)
Chloride: 104 mmol/L (ref 98–111)
Creatinine, Ser: 0.97 mg/dL (ref 0.61–1.24)
GFR calc Af Amer: >60 mL/min (ref >60)
GFR calc non Af Amer: >60 mL/min (ref >60)
Glucose, Bld: 90 mg/dL (ref 70–99)
Potassium: 4 mmol/L (ref 3.5–5.1)
Sodium: 138 mmol/L (ref 135–145)

## 2018-02-27 LAB — I-STAT CHEM 8, ED
BUN: 14 mg/dL (ref 6–20)
Calcium, Ion: 1.13 mmol/L — ABNORMAL LOW (ref 1.15–1.40)
Chloride: 105 mmol/L (ref 98–111)
Creatinine, Ser: 1 mg/dL (ref 0.61–1.24)
Glucose, Bld: 86 mg/dL (ref 70–99)
HCT: 48 % (ref 39.0–52.0)
Hemoglobin: 16.3 g/dL (ref 13.0–17.0)
Potassium: 4 mmol/L (ref 3.5–5.1)
Sodium: 138 mmol/L (ref 135–145)
TCO2: 26 mmol/L (ref 22–32)

## 2018-02-27 MED ORDER — ONDANSETRON HCL 4 MG PO TABS: 4.0000 mg | ORAL_TABLET | Freq: Four times a day (QID) | ORAL | 0 refills | Status: DC

## 2018-02-27 MED ORDER — METHOCARBAMOL 500 MG PO TABS
750.0000 mg | ORAL_TABLET | Freq: Once | ORAL | Status: AC
  Administered 2018-02-27: 750 mg via ORAL
  Filled 2018-02-27: qty 2

## 2018-02-27 MED ORDER — PREDNISONE 10 MG PO TABS: 20.0000 mg | ORAL_TABLET | Freq: Every day | ORAL | 0 refills | Status: DC

## 2018-02-27 MED ORDER — HYDROCODONE-ACETAMINOPHEN 5-325 MG PO TABS: 1.0000 | ORAL_TABLET | Freq: Four times a day (QID) | ORAL | 0 refills | Status: DC | PRN

## 2018-02-27 MED ORDER — PROMETHAZINE HCL 25 MG/ML IJ SOLN
12.5000 mg | Freq: Once | INTRAMUSCULAR | Status: AC
  Administered 2018-02-27: 12.5 mg via INTRAVENOUS
  Filled 2018-02-27: qty 1

## 2018-02-27 MED ORDER — LIDOCAINE 5 % EX PTCH: 1.0000 | MEDICATED_PATCH | CUTANEOUS | 0 refills | Status: DC

## 2018-02-27 MED ORDER — ONDANSETRON HCL 4 MG/2ML IJ SOLN
4.0000 mg | Freq: Once | INTRAMUSCULAR | Status: AC
  Administered 2018-02-27: 4 mg via INTRAVENOUS
  Filled 2018-02-27: qty 2

## 2018-02-27 MED ORDER — METHOCARBAMOL 1000 MG/10ML IJ SOLN: 1000.0000 mg | Freq: Once | INTRAMUSCULAR | Status: DC

## 2018-02-27 MED ORDER — HYDROMORPHONE HCL 1 MG/ML IJ SOLN
1.0000 mg | Freq: Once | INTRAMUSCULAR | Status: AC
  Administered 2018-02-27: 1 mg via INTRAVENOUS
  Filled 2018-02-27: qty 1

## 2018-02-27 MED ORDER — GADOBUTROL 1 MMOL/ML IV SOLN
7.0000 mL | Freq: Once | INTRAVENOUS | Status: AC | PRN
  Administered 2018-02-27: 7 mL via INTRAVENOUS

## 2018-02-27 MED ORDER — LIDOCAINE 5 % EX PTCH
1.0000 | MEDICATED_PATCH | CUTANEOUS | Status: DC
  Administered 2018-02-27: 1 via TRANSDERMAL
  Filled 2018-02-27: qty 1

## 2018-02-27 MED ORDER — MORPHINE SULFATE (PF) 4 MG/ML IV SOLN
4.0000 mg | Freq: Once | INTRAVENOUS | Status: AC
  Administered 2018-02-27: 4 mg via INTRAVENOUS
  Filled 2018-02-27: qty 1

## 2018-02-27 MED ORDER — NAPROXEN 500 MG PO TABS: 500.0000 mg | ORAL_TABLET | Freq: Two times a day (BID) | ORAL | 0 refills | Status: DC

## 2018-02-27 MED ORDER — SODIUM CHLORIDE 0.9 % IV BOLUS
1000.0000 mL | Freq: Once | INTRAVENOUS | Status: AC
  Administered 2018-02-27: 1000 mL via INTRAVENOUS

## 2018-02-27 MED ORDER — MORPHINE SULFATE (PF) 4 MG/ML IV SOLN: 4.0000 mg | Freq: Once | INTRAVENOUS | Status: DC | PRN

## 2018-02-27 MED ORDER — LORAZEPAM 2 MG/ML IJ SOLN
1.0000 mg | Freq: Once | INTRAMUSCULAR | Status: AC | PRN
  Administered 2018-02-27: 1 mg via INTRAVENOUS
  Filled 2018-02-27: qty 1

## 2018-02-27 MED ORDER — METHOCARBAMOL 500 MG PO TABS: 500.0000 mg | ORAL_TABLET | Freq: Two times a day (BID) | ORAL | 0 refills | Status: DC

## 2018-02-27 MED ORDER — DEXAMETHASONE 4 MG PO TABS
10.0000 mg | ORAL_TABLET | Freq: Once | ORAL | Status: AC
  Administered 2018-02-27: 10 mg via ORAL
  Filled 2018-02-27: qty 3

## 2018-02-27 NOTE — ED Notes (Signed)
Patient transported to MRI 

## 2018-02-27 NOTE — Discharge Instructions (Addendum)
You were seen here today for Back Pain:  Your MRI showed: IMPRESSION: 1. No acute osseous abnormality or malalignment. 2. Partial interval reduction of L5-S1 disc protrusion with decreased disc displacement. Stable mild degenerative changes at additional lumbar levels. 3. No significant foraminal or canal stenosis  Low back pain is discomfort in the lower back that may be due to injuries to muscles and ligaments around the spine. Occasionally, it may be caused by a problem to a part of the spine called a disc. Your back pain should be treated with medicines listed below as well as back exercises and this back pain should get better over the next 2 weeks. Most patients get completely well in 4 weeks. It is important to know however, if you develop severe or worsening pain, low back pain with fever, numbness, weakness or inability to walk or urinate, you should return to the ER immediately.  Please follow up with your doctor this week for a recheck if still having symptoms.  HOME INSTRUCTIONS Self - care:  The application of heat can help soothe the pain.  Maintaining your daily activities, including walking (this is encouraged), as it will help you get better faster than just staying in bed. Do not life, push, pull anything more than 10 pounds for the next week. I am attaching back exercises that you can do at home to help facilitate your recovery.   Back Exercises - I have attached a handout on back exercises that can be done at home to help facilitate your recovery.   Medications are also useful to help with pain control.   For pain control you may take:   Naproxen twice daily with food as directed.   Use lidocaine patches as prescribed.   Muscle relaxants:  These medications can help with muscle tightness that is a cause of lower back pain.  Most of these medications can cause drowsiness, and it is not safe to drive or use dangerous machinery while taking them. They are primarily helpful  when taken at night before sleep.   For breakthrough pain you may take Norco. Do not drink alcohol drive or operate heavy machinery when taking. You are being provided a prescription for opiates (also known as narcotics) for pain control on an as needed basis.  Opiates can be addictive and should only be used when absolutely necessary for pain control when other alternatives do not work.  We recommend you only use them for the recommended amount of time and only as prescribed.  Please do not take with other sedative medications or alcohol.  Please do not drive, operate machinery, or make important decisions while taking opiates.  Please note that these medications can be addictive and have high abuse potential.  Please keep these medications locked away from children, teenagers or any family members with history of substance abuse. Additionally, these medications may cause constipation - take over the counter stool softeners or add fiber to your diet to treat this (Metamucil, Psyllium Fiber, Colace, Miralax) Further refills will need to be obtained from your primary care doctor and will not be prescribed through the Emergency Department. You will test positive on most drug tests while taking this medication.   You will need to follow up with your primary healthcare provider  or the Orthopedist in 1-2 weeks for reassessment and persistent symptoms.  Be aware that if you develop new symptoms, such as a fever, leg weakness, difficulty with or loss of control of your urine or bowels,  abdominal pain, or more severe pain, you will need to seek medical attention and/or return to the Emergency department. Additional Information:  Your vital signs today were: BP 108/65    Pulse 62    Temp (!) 97.5 F (36.4 C) (Oral)    Resp 20    Ht 5\' 10"  (1.778 m)    Wt 83.9 kg    SpO2 99%    BMI 26.54 kg/m  If your blood pressure (BP) was elevated above 135/85 this visit, please have this repeated by your doctor within one  month. ---------------

## 2018-02-27 NOTE — ED Triage Notes (Signed)
Pt arrives POV with reports of lumbar pain after moving things in the garage. Pt with known bulging discs in his lower back. States unable to stand. Denies incontinence.

## 2018-02-27 NOTE — ED Notes (Signed)
Patient verbalizes understanding of discharge instructions. Opportunity for questioning and answers were provided. 

## 2018-02-27 NOTE — ED Provider Notes (Signed)
[facility] DEPARTMENT Provider Note   CSN: [id number] Arrival date & time: 02/27/18  1309     History   Chief Complaint Chief Complaint  Patient presents with  . Back Pain    HPI Willie Lucas is a 52 y.o. male with a history of MS, neuromuscular disorder, chronic back pain followed by Dr. Felecia Shelling of neurology who presents emergency department today for back pain.  Patient reports that 2 hours ago he was in his garage picking up a empty box when he had the sudden onset of a 10/10, sharp stabbing pain in the middle of his back.  He notes that he has had bulging disks and slipped disc before but this is more excruciating pain that he is ever fell.  He notes the pain radiates down the back of both of his legs and is associated numbness.  Patient has tried Motrin for his symptoms without any relief.  He notes that his son tried to assist him to get off the ground but he was unable to and he has not been able to walk since the event.  He notes some mild relief when lying onto his right side.  Patient denies any bowel/bladder incontinence.  He has not urinated since the event.  He denies saddle anesthesia.  Patient does receive injections into his back by neurology.  He denies any IV drug use.  No fevers.  No preceding trauma.  HPI  Past Medical History:  Diagnosis Date  . Anxiety   . Benign fundic gland polyps of stomach   . Headache(784.0)   . MS (multiple sclerosis) ([place])   . Neuromuscular disorder ([place])   . Ulcer    hx of duodenal ulcer at age 85    Patient Active Problem List   Diagnosis Date Noted  . Paresthesias 11/25/2016  . Common migraine without intractability 07/04/2014  . Exercise-induced reactive airway disease 04/24/2014  . Pure hypercholesterolemia 03/08/2013  . SCIATICA 07/16/2009  . MUSCLE SPASM, LUMBOSACRAL REGION 07/16/2009  . Lumbar radicular pain 07/16/2009  . URI 04/30/2009  . MULTIPLE SCLEROSIS 03/28/2009  . ACUT PEPTC ULCR UNS  SITE W/HEM W/O MENTION OBST 03/28/2009  . HEADACHE 03/28/2009    Past Surgical History:  Procedure Laterality Date  . RHINOPLASTY     X2        Home Medications    Prior to Admission medications   Medication Sig Start Date End Date Taking? Authorizing Provider  Alum & Mag Hydroxide-Simeth (GI COCKTAIL) SUSP suspension once as needed. 12/25/16   [provider]  clonazePAM (KLONOPIN) 0.5 MG tablet take 1 tablet by mouth twice a day if needed for anxiety 08/11/16   Sater, Nanine Means, MD  dicyclomine (BENTYL) 10 MG capsule Take 1 capsule (10 mg total) 3 (three) times daily before meals by mouth. 01/27/17   Ladene Artist, MD  omeprazole (PRILOSEC) 40 MG capsule Take 1 capsule daily. 04/15/17   Ladene Artist, MD  rizatriptan (MAXALT) 5 MG tablet TAKE 1 TABLET BY MOUTH IMMEDIATELY AND MAY REPEAT AFTER 2 HOURS**NEED DOCTOR VISIT 02/08/18   Sater, Nanine Means, MD    Family History Family History  Problem Relation Age of Onset  . Diabetes Mother   . Arthritis Mother   . Brain cancer Brother        died  . Colon cancer Neg Hx   . Stomach cancer Neg Hx     Social History Social History   Tobacco Use  . Smoking  status: Former Smoker    Last attempt to quit: 1998    Years since quitting: 21.9  . Smokeless tobacco: Never Used  Substance Use Topics  . Alcohol use: Yes    Alcohol/week: 2.0 - 3.0 standard drinks    Types: 2 - 3 Glasses of wine per week    Comment: occ  . Drug use: No     Allergies   Patient has no known allergies.   Review of Systems Review of Systems  All other systems reviewed and are negative.    Physical Exam Updated Vital Signs BP 136/67   Pulse 86   Temp (!) 97.5 F (36.4 C) (Oral)   Resp 20   Ht 5\' 10"  (1.778 m)   Wt 83.9 kg   SpO2 99%   BMI 26.54 kg/m   Physical Exam  Constitutional: He appears well-developed and well-nourished. No distress.  Non-toxic appearing. Patient curled onto his right side and appears in pain  HENT:   Head: Normocephalic and atraumatic.  Right Ear: External ear normal.  Left Ear: External ear normal.  Neck: Normal range of motion. Neck supple. No spinous process tenderness present. No neck rigidity. Normal range of motion present.  Cardiovascular: Normal rate, regular rhythm, normal heart sounds and intact distal pulses.  No murmur heard. Pulses:      Radial pulses are 2+ on the right side, and 2+ on the left side.       Femoral pulses are 2+ on the right side, and 2+ on the left side.      Dorsalis pedis pulses are 2+ on the right side, and 2+ on the left side.       Posterior tibial pulses are 2+ on the right side, and 2+ on the left side.  Pulmonary/Chest: Effort normal and breath sounds normal. No respiratory distress.  Abdominal: Soft. Bowel sounds are normal. He exhibits no distension and no pulsatile midline mass. There is no tenderness. There is no rigidity, no rebound, no guarding and no CVA tenderness.  Musculoskeletal:  Posterior and appearance appears normal. No evidence of obvious scoliosis or kyphosis. No obvious signs of skin changes, trauma, deformity, infection. No C, T spine tenderness or step-offs to palpation. No C, T paraspinal tenderness. Patient with TTP at the level of L5-S1 without step offs noted with bilateral paraspinal muscle TTP and positive muscle spasms. Lung expansion normal. Bilateral lower extremity strength 5 out of 5 including extensor hallucis longus. After controlled pain - Patellar and Achilles deep tendon reflex 2+ and equal bilaterally. Patient has decreased sensation of the lower extremities b/l when compared to upper extremities. Lower extremity compartments soft. PT and DP 2+ b/l. Cap refill <2 seconds.   Neurological: He is alert.  No foot drop.  Skin: Skin is warm, dry and intact. Capillary refill takes less than 2 seconds. No rash noted. He is not diaphoretic. No erythema.  No vesicular-like rash noted.  Nursing note and vitals  reviewed.    ED Treatments / Results  Labs (all labs ordered are listed, but only abnormal results are displayed) Labs Reviewed  I-STAT CHEM 8, ED - Abnormal; Notable for the following components:      Result Value   Calcium, Ion 1.13 (*)    All other components within normal limits  CBC WITH DIFFERENTIAL/PLATELET  BASIC METABOLIC PANEL    EKG None  Radiology Mr Lumbar Spine W Wo Contrast (assess For Abscess, Cord Compression)  Result Date: 02/27/2018 CLINICAL DATA:  52 y/o  M; lumbar pain radiating to the lower extremities with numbness after moving heavy objects. EXAM: MRI LUMBAR SPINE WITHOUT AND WITH CONTRAST TECHNIQUE: Multiplanar and multiecho pulse sequences of the lumbar spine were obtained without and with intravenous contrast. CONTRAST:  7 cc Gadavist COMPARISON:  08/01/2009 lumbar spine MRI. FINDINGS: Segmentation:  Standard. Alignment:  Physiologic. Vertebrae: No fracture, evidence of discitis, or bone lesion. No abnormal enhancement. Conus medullaris and cauda equina: Conus extends to the L1 level. Conus and cauda equina appear normal. No abnormal enhancement. Paraspinal and other soft tissues: Negative. Disc levels: L1-2: No significant disc displacement, foraminal stenosis, or canal stenosis. L2-3: Stable mild disc bulge. No significant foraminal or canal stenosis. L3-4: Stable mild disc bulge. No significant foraminal or canal stenosis. L4-5: Stable disc bulge with left central and subarticular disc protrusion. The protrusion narrows the left lateral recess and results in mild left foraminal stenosis. No right foraminal or canal stenosis. L5-S1: Partial reduction of right central and subarticular disc protrusion with interval decrease in disc displacement. No significant foraminal or canal stenosis. IMPRESSION: 1. No acute osseous abnormality or malalignment. 2. Partial interval reduction of L5-S1 disc protrusion with decreased disc displacement. Stable mild degenerative changes  at additional lumbar levels. 3. No significant foraminal or canal stenosis. Electronically Signed   By: Kristine Garbe M.D.   On: 02/27/2018 19:11    Procedures Procedures (including critical care time)  Medications Ordered in ED Medications  morphine 4 MG/ML injection 4 mg (has no administration in time range)  sodium chloride 0.9 % bolus 1,000 mL (1,000 mLs Intravenous New Bag/Given 02/27/18 2048)  lidocaine (LIDODERM) 5 % 1 patch (1 patch Transdermal Patch Applied 02/27/18 2048)  promethazine (PHENERGAN) injection 12.5 mg (has no administration in time range)  morphine 4 MG/ML injection 4 mg (4 mg Intravenous Given 02/27/18 1421)  sodium chloride 0.9 % bolus 1,000 mL (0 mLs Intravenous Stopped 02/27/18 2022)  LORazepam (ATIVAN) injection 1 mg (1 mg Intravenous Given 02/27/18 1522)  HYDROmorphone (DILAUDID) injection 1 mg (1 mg Intravenous Given 02/27/18 1659)  ondansetron (ZOFRAN) injection 4 mg (4 mg Intravenous Given 02/27/18 1854)  gadobutrol (GADAVIST) 1 MMOL/ML injection 7 mL (7 mLs Intravenous Contrast Given 02/27/18 1852)  dexamethasone (DECADRON) tablet 10 mg (10 mg Oral Given 02/27/18 2048)  methocarbamol (ROBAXIN) tablet 750 mg (750 mg Oral Given 02/27/18 2048)  HYDROmorphone (DILAUDID) injection 1 mg (1 mg Intravenous Given 02/27/18 2048)  ondansetron (ZOFRAN) injection 4 mg (4 mg Intravenous Given 02/27/18 2048)     Initial Impression / Assessment and Plan / ED Course  I have reviewed the triage vital signs and the nursing notes.  Pertinent labs & imaging results that were available during my care of the patient were reviewed by me and considered in my medical decision making (see chart for details).     52 y.o. male presenting for back pain after pending down to pick up an empty box today.  Patient with tenderness at level of L5-S1.  No step-off noted.  He has bilateral paraspinal tenderness as well as muscle spasms.  He has equal strength of the lower extremities as  well as equal and symmetric deep tendon reflexes.  He has noted sensory deficit of lower extremities. He defers rectal at this time. Will obtain MRI to evaluate.  Patient has received injections in to his spine with steroids.  Will obtain with and without contrast. Case discussed with Dr. Melina Copa.   Lab work reviewed and reassuring.   Patient now able  to lie on back. Lumbar support placed under patienit with bed sheet. He has had 4 morphine and 1mg  of ativan. Mild relief but still notes large amount of patient. Will order dilaudid. Awaiting MRI.   MRI with no acute osseous abnormality or malalignment.  There is improvement of prior L5-S1 disc protrusion.  No significant cause of patient's pain.  Patient was given several doses of pain medication department and pain is now controlled.  Patient is ambulatory in the department able to stand on his own.  We discussed outpatient treatment and patient would like to be discharged home.  Will provide a short course of pain medication for the patient.  Patient was reviewed in the [place] controlled substance database without any discrepancies found. Suspect patient's symptoms are musculoskeletal in nature.  Will treat the patient with back exercises, activity modification, muscle relaxers, NSAIDs (no history of kidney disease or GI bleed).  Patient also given Decadron in the department.  Also given lidocaine patches.  Patient is to follow-up with PCP versus orthopedics.  Strict return precautions discussed. Vitals stable.  Patient appears safe for discharge.  Prior to discharge, discussed case with Dr. Francia Greaves who agrees with plan.   Final Clinical Impressions(s) / ED Diagnoses   Final diagnoses:  Acute bilateral low back pain, unspecified whether sciatica present    ED Discharge Orders         Ordered    HYDROcodone-acetaminophen (NORCO/VICODIN) 5-325 MG tablet  Every 6 hours PRN     02/27/18 2210    ondansetron (ZOFRAN) 4 MG tablet  Every 6 hours      02/27/18 2210    lidocaine (LIDODERM) 5 %  Every 24 hours     02/27/18 2210    methocarbamol (ROBAXIN) 500 MG tablet  2 times daily     02/27/18 2210           Lorelle Gibbs 02/27/18 2230    Valarie Merino, MD 02/28/18 [phone]

## 2018-02-28 ENCOUNTER — Telehealth: Payer: Self-pay | Admitting: Neurology

## 2018-02-28 NOTE — Telephone Encounter (Signed)
Patient states he needs to be seen soon. Yesterday he was seen in the ED because of sharp pains in his back and could not get up.

## 2018-02-28 NOTE — Telephone Encounter (Signed)
I have spoken with Willie Lucas. He sts. the ER also gave him Prednisone 10mg , taking 2 tabs daily. Has 3 days left. Per RAS, it is reasonable to monitor pain and see what relief he gets with the steroid. If no improvement in the next wk., call back. If bulges in lumbar spine bother him consistently, will consider NS referral to discuss surgical options. He is agreeable and will call back if no relief with Prednisone. He has the pain meds given in the ER/fim

## 2018-02-28 NOTE — Telephone Encounter (Signed)
I spoke with Willie Lucas. He sts. he was working in his garage over the weekend, bent over and "it was like an ice pick."  He was seen in the ER and MRI L-S spine done, and he was given rx's for Lidoderm patches, Hydrocodone and Robaxin.  Will ask Dr. Felecia Shelling to look at the MRI to see if he would change pt's tx. plan at all, then call pt. back/fim

## 2018-02-28 NOTE — Telephone Encounter (Signed)
Pt has called just wanting RN Faith to know that he has called to see if there has been a response from Dr Felecia Shelling yet.  Pt asking to be called as soon as there is a response

## 2018-02-28 NOTE — Telephone Encounter (Signed)
Please let him know I looked at the MRI and compared to 2011  He has a disc protrusion to the left at L4L5 crowding the left L5 nerve root without compressing it.   Looks same as 2011.  He has a small disc protrusion to the right at L5S1 not involving the nerve root and looking better than 2011

## 2018-04-27 ENCOUNTER — Telehealth: Payer: Self-pay | Admitting: Neurology

## 2018-04-27 MED ORDER — METHYLPREDNISOLONE 4 MG PO TBPK: ORAL_TABLET | ORAL | 0 refills | Status: DC

## 2018-04-27 NOTE — Telephone Encounter (Signed)
Called pt. He is having tingling in hands, legs, face. Started two days. Intermittent. Progressed more today. No signs of infection,vision problems, ambulation issues. A little more fatigued since sx started. Denies any other sx. Feels this is like his "annual" Ms flare up. Requesting steroid pack if Dr. Felecia Shelling is ok with this. I reviewed his chart. He was last seen 11/2016. Made earlier f/u for 05/12/18 at 9am, check in 830am. Cx appt that was in April 2020. Pt aware. Advised I will discuss with Dr. Felecia Shelling and call back with recommendation.

## 2018-04-27 NOTE — Telephone Encounter (Signed)
I called patient back and relayed message below. Verified pharmacy on file. I e-scribed refill. Pt verbalized understanding and will f/u to see Dr. Felecia Shelling on 05/12/18.

## 2018-04-27 NOTE — Telephone Encounter (Signed)
Spoke with Dr. Felecia Shelling- ok to call in steroid pack for patient and he will see him at f/u on 05/12/18

## 2018-04-27 NOTE — Telephone Encounter (Signed)
Pt called to change his March appt due to schedule conflict to April states he is having mild MS symptoms and would like to know if Steroid pack can be called in for him. Please advise.

## 2018-05-10 ENCOUNTER — Telehealth: Payer: Self-pay | Admitting: *Deleted

## 2018-05-10 NOTE — Telephone Encounter (Signed)
I called pt to see if he could come for appt tomorrow morning at 830am instead of Thursday at [facility] with Dr. Felecia Shelling. He could not. He already has clients scheduled tomorrow. He will keep original appt for Thursday. He appreciated the call.

## 2018-05-12 ENCOUNTER — Ambulatory Visit (INDEPENDENT_AMBULATORY_CARE_PROVIDER_SITE_OTHER): Payer: BLUE CROSS/BLUE SHIELD | Admitting: Neurology

## 2018-05-12 ENCOUNTER — Encounter: Payer: Self-pay | Admitting: Neurology

## 2018-05-12 VITALS — BP 116/73 | HR 67 | Ht 70.0 in | Wt 194.6 lb

## 2018-05-12 DIAGNOSIS — R202 Paresthesia of skin: Secondary | ICD-10-CM

## 2018-05-12 DIAGNOSIS — G35 Multiple sclerosis: Secondary | ICD-10-CM

## 2018-05-12 DIAGNOSIS — R200 Anesthesia of skin: Secondary | ICD-10-CM

## 2018-05-12 DIAGNOSIS — M5416 Radiculopathy, lumbar region: Secondary | ICD-10-CM

## 2018-05-12 DIAGNOSIS — G43009 Migraine without aura, not intractable, without status migrainosus: Secondary | ICD-10-CM

## 2018-05-12 MED ORDER — RIZATRIPTAN BENZOATE 5 MG PO TABS: ORAL_TABLET | ORAL | 11 refills | Status: DC

## 2018-05-12 NOTE — Progress Notes (Signed)
[facility]  PATIENT: Willie Lucas DOB: 1965/12/02  REFERRING DOCTOR OR PCP:  Benay Pillow SOURCE: Patient and records from [place] [facility]  _________________________________   HISTORICAL  CHIEF COMPLAINT:  Chief Complaint  Patient presents with  . Multiple Sclerosis    "pseudo exacerbation or flare up with tingling, numbness, weakness in afternoons"   . Follow-up    HISTORY OF PRESENT ILLNESS: Willie Lucas is a 53 y.o.. man with MS.      05/12/2018: He was on Copaxone but has been off any DMT since 2012/2013.  Last MRI's in 2016 did not show active disease.    He has been experiencing more numbness (with prickly sensation), especially in his hands over the last few weeks.    This sensation is fluctuating but has had some degree of full body dysesthesia.     He notes no change in balance or strength.    No bladder changes.      He also notes his hands will tingle more when he is holding something superimposed on the other symptoms.   He has very mild hand weakness x months.       His LBP is doing better.  Migraines occur a few days a month and Relpax with Motrin helps most of the time.    From 11/25/2016: Willie Lucas is a 53 year old man who was diagnosed with multiple sclerosis around 2001 who also has migraine headaches and lumbar disc degenerative changes .  About 3 weeks ago, he had hearing loss while watching TV.   Hearing returned a few minutes later.     A few days later, he had tingling and light numbness in his fingers that would intensify and then be on legs and groin.    Oral steroids helped x several days anf then stymptoms returned.    MRI of The cervical spine showed a disc protrusion at C5-C6 but no spinal cord stenosis, spinal cord will sign or nerve root compression.   MRI of the brain showed a stable pattern of T2/FLAIR hyperintense foci in the periventricular and juxtacortical white matter. No change compared to 2016.   There is no  weakness or clumsiness in the arms or legs. Her function is fine. Vision is fine.  Low back pain/lumbar radiculopathy: This is currently doing well but flares up once or twice a year, most recently several months ago.  When more severe, ESI has helped. MRI of the lumbar spine dated 08/01/2009 shows disc degenerative changes at L2-L3, L4-L5 and L5-S1. At L4-L5, there is a left paramedian disc protrusion with some encroachment on the left L5 nerve root. At L5-S1 right paramedian disc protrusion with some encroachment on the S1 nerve root.  Migraine headaches: Migraines are doing better since starting clonazepam at bedtime.   When the pain is present it is throbbing, usually unilateral and over the eye. He gets photophobia and phonophobia and moving makes the pain worse. He gets some nausea but usually does not have vomiting.  Currently, he has one bad migraine a month.   He takes sublingual Maxalt 10 mg MLT with ibuprofen and is better within 30 minutes.    They were occurring 3-4 times a month before clonazepam.      Mood:   Anxiety and sleep are helped by clonazepam and he takes one every morning and a second one many afternoons      Multiple sclerosis History: I saw Willie Lucas a couple years after his diagnosis of multiple sclerosis. He  has done very well. Initially he had several exacerbations with numbness and clumsiness and visual changes.[name] He placed on Copaxone and did not have any further exacerbations. About 5 or 6 years ago he went to every other day and year or 2 later he stopped the Copaxone. He has continued to do well with no definite exacerbations the last 5 years. An MRI was performed 09/08/2012.  It was compared to one dated 01/31/2008 andthere was only one new lesion in that 5 year interval.  There were no acute lesions on either study.  After his diagnosis he began to exercise on a very regular basis and continues to do so. He denies any significant sequela from the MS. Specifically, there is  no difficulty with his gait and he notes no numbness or weakness. He does not have bladder or visual dysfunction. His mood issues with anxiety are probably unrelated to the MS. He does not note much for fatigue and remains active and travels frequently.   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  REVIEW OF SYSTEMS: Constitutional: No fevers, chills, sweats, or change in appetite Eyes: No visual changes, double vision, eye pain Ear, nose and throat: No hearing loss, ear pain, nasal congestion, sore throat Cardiovascular: No chest pain, palpitations Respiratory: No shortness of breath at rest or with exertion.   No wheezes GastrointestinaI: No nausea, vomiting, diarrhea, abdominal pain, fecal incontinence Genitourinary: No dysuria, urinary retention or frequency.  No nocturia. Musculoskeletal: See above  Integumentary: No rash, pruritus, skin lesions Neurological: as above Psychiatric: No depression at this time.  Notes anxiety Endocrine: No palpitations, diaphoresis, change in appetite, change in weigh or increased thirst Hematologic/Lymphatic: No anemia, purpura, petechiae. Allergic/Immunologic: No itchy/runny eyes, nasal congestion, recent allergic reactions, rashes  ALLERGIES: No Known Allergies  HOME MEDICATIONS:  Current Outpatient Medications:  .  clonazePAM (KLONOPIN) 0.5 MG tablet, take 1 tablet by mouth twice a day if needed for anxiety (Patient taking differently: Take 0.5 mg by mouth as needed. ), Disp: 60 tablet, Rfl: 5 .  dicyclomine (BENTYL) 10 MG capsule, Take 1 capsule (10 mg total) 3 (three) times daily before meals by mouth., Disp: 30 capsule, Rfl: 11 .  omeprazole (PRILOSEC) 40 MG capsule, Take 1 capsule daily. (Patient taking differently: Take 40 mg by mouth at bedtime. Take 1 capsule daily.), Disp: 30 capsule, Rfl: 11 .  HYDROcodone-acetaminophen (NORCO/VICODIN) 5-325 MG tablet, Take 1 tablet by mouth every 6 (six) hours as needed.  (Patient not taking: Reported on 05/12/2018), Disp: 12 tablet, Rfl: 0 .  lidocaine (LIDODERM) 5 %, Place 1 patch onto the skin daily. Remove & Discard patch within 12 hours or as directed by MD (Patient not taking: Reported on 05/12/2018), Disp: 30 patch, Rfl: 0 .  methocarbamol (ROBAXIN) 500 MG tablet, Take 1 tablet (500 mg total) by mouth 2 (two) times daily. (Patient not taking: Reported on 05/12/2018), Disp: 20 tablet, Rfl: 0 .  naproxen (NAPROSYN) 500 MG tablet, Take 1 tablet (500 mg total) by mouth 2 (two) times daily. (Patient not taking: Reported on 05/12/2018), Disp: 30 tablet, Rfl: 0 .  ondansetron (ZOFRAN) 4 MG tablet, Take 1 tablet (4 mg total) by mouth every 6 (six) hours. (Patient not taking: Reported on 05/12/2018), Disp: 12 tablet, Rfl: 0 .  rizatriptan (MAXALT) 5 MG tablet, One po prn headache.   May repeat in 2 hours if needed, Disp: 10 tablet, Rfl: 11  Current Facility-Administered Medications:  .  0.9 %  sodium  chloride infusion, 500 mL, Intravenous, Continuous, Ladene Artist, MD  Facility-Administered Medications Ordered in Other Visits:  .  gadopentetate dimeglumine (MAGNEVIST) injection 19 mL, 19 mL, Intravenous, Once PRN, Harsha Yusko, Nanine Means, MD  PAST MEDICAL HISTORY: Past Medical History:  Diagnosis Date  . Anxiety   . Benign fundic gland polyps of stomach   . Headache(784.0)   . MS (multiple sclerosis) ([place])   . Neuromuscular disorder ([place])   . Ulcer    hx of duodenal ulcer at age 25    PAST SURGICAL HISTORY: Past Surgical History:  Procedure Laterality Date  . RHINOPLASTY     X2    FAMILY HISTORY: Family History  Problem Relation Age of Onset  . Diabetes Mother   . Arthritis Mother   . Brain cancer Brother        died  . Colon cancer Neg Hx   . Stomach cancer Neg Hx     SOCIAL HISTORY:  Social History   Socioeconomic History  . Marital status: Divorced    Spouse name: Not on file  . Number of children: Not on file  . Years of education: Not on  file  . Highest education level: Not on file  Occupational History  . Occupation: Music therapist  Social Needs  . Financial resource strain: Not on file  . Food insecurity:    Worry: Not on file    Inability: Not on file  . Transportation needs:    Medical: Not on file    Non-medical: Not on file  Tobacco Use  . Smoking status: Former Smoker    Last attempt to quit: 1998    Years since quitting: 22.1  . Smokeless tobacco: Never Used  Substance and Sexual Activity  . Alcohol use: Yes    Alcohol/week: 2.0 - 3.0 standard drinks    Types: 2 - 3 Glasses of wine per week    Comment: occ  . Drug use: No  . Sexual activity: Yes  Lifestyle  . Physical activity:    Days per week: Not on file    Minutes per session: Not on file  . Stress: Not on file  Relationships  . Social connections:    Talks on phone: Not on file    Gets together: Not on file    Attends religious service: Not on file    Active member of club or organization: Not on file    Attends meetings of clubs or organizations: Not on file    Relationship status: Not on file  . Intimate partner violence:    Fear of current or ex partner: Not on file    Emotionally abused: Not on file    Physically abused: Not on file    Forced sexual activity: Not on file  Other Topics Concern  . Not on file  Social History Narrative  . Not on file     PHYSICAL EXAM  Vitals:   05/12/18 0918  BP: 116/73  Pulse: 67  Weight: 194 lb 9.6 oz (88.3 kg)  Height: 5\' 10"  (1.778 m)    Body mass index is 27.92 kg/m.   General: The patient is well-developed and well-nourished and in mild acute distress  Neck: The neck is supple   The neck is nontender.  Skin:  Extremities are without rash or edema.   Neurologic Exam  Mental status: The patient is alert and oriented x 3 at the time of the examination. The patient has apparent normal recent and remote memory,  with an apparently normal attention span and concentration ability.    Speech is normal.  Cranial nerves: Extraocular movements are full.  Facial strength and sensation was normal.  The tongue is midline, and the patient has symmetric elevation of the soft palate. No obvious hearing deficits are noted.  Motor: Muscle bulk, tone and strength was normal  Sensory: There were no Tinel signs.  He had intact sensation to touch temperature and vibration in the arms and legs.  Coordination: Cerebellar testing reveals good finger-nose-finger and heel-to-shin bilaterally.  Gait and station: Station is normal.   Gait is normal. Tandem gait is normal. Romberg is negative.   Reflexes: Deep tendon reflexes are symmetric and normal bilaterally.    [name]    DIAGNOSTIC DATA (LABS, IMAGING, TESTING) - I reviewed patient records, labs, notes, testing and imaging myself where available.  Lab Results  Component Value Date   WBC 6.2 02/27/2018   HGB 16.3 02/27/2018   HCT 48.0 02/27/2018   MCV 90.4 02/27/2018   PLT 151 02/27/2018      Component Value Date/Time   NA 138 02/27/2018 1429   K 4.0 02/27/2018 1429   CL 105 02/27/2018 1429   CO2 24 02/27/2018 1414   GLUCOSE 86 02/27/2018 1429   BUN 14 02/27/2018 1429   CREATININE 1.00 02/27/2018 1429   CALCIUM 9.1 02/27/2018 1414   PROT 6.7 12/08/2016 1548   ALBUMIN 4.5 12/08/2016 1548   AST 16 12/08/2016 1548   ALT 13 12/08/2016 1548   ALKPHOS 68 12/08/2016 1548   BILITOT 0.6 12/08/2016 1548   GFRNONAA >60 02/27/2018 1414   GFRAA >60 02/27/2018 1414   Lab Results  Component Value Date   CHOL 223 (H) 04/24/2014   HDL 57.60 04/24/2014   LDLCALC 131 (H) 04/24/2014   LDLDIRECT 176.8 02/28/2013   TRIG 171.0 (H) 04/24/2014   CHOLHDL 4 04/24/2014   No results found for: HGBA1C No results found for: VITAMINB12 Lab Results  Component Value Date   TSH 1.11 02/28/2013     ASSESSMENT AND PLAN  Paresthesias  Multiple sclerosis ([place]) - Plan: MR CERVICAL SPINE W WO CONTRAST, MR BRAIN W WO  CONTRAST  Numbness  Common migraine without intractability  Lumbar radicular pain   1.   He could be having a small central exacerbation.  I will hold off on steroid treatment but we need to check another MRI of the brain and cervical spine (last 1 4 years ago).  If he is showing new lesions, he needs to get back on a disease modifying therapy.  He has been on Copaxone in the past but stopped about 7 years ago..  2.   If symptoms in the hand worsen, we will check a nerve conduction study to determine if there is carpal tunnel syndrome.  He had an MRI of the one wrist and will send the report to me..  3.    Continue Maxalt for migraines.  He will return to see me in 6-12 months or sooner if he has new or worsening neurologic symptoms.   Darvis Croft A. Felecia Shelling, MD, PhD 3/97/6734, 1:93 PM Certified in Neurology, Clinical Neurophysiology, Sleep Medicine, Pain Medicine and Neuroimaging  [facility] [address] [place], [place] [address] [phone]

## 2018-05-15 ENCOUNTER — Other Ambulatory Visit: Payer: Self-pay | Admitting: Gastroenterology

## 2018-05-17 ENCOUNTER — Telehealth: Payer: Self-pay | Admitting: Neurology

## 2018-05-17 NOTE — Telephone Encounter (Signed)
lvm for pt to call back about scheduling mri  [employer] Auth: [id number] (exp. 05/16/18 to 06/14/18)

## 2018-05-31 ENCOUNTER — Ambulatory Visit: Payer: BLUE CROSS/BLUE SHIELD | Admitting: Neurology

## 2018-07-06 ENCOUNTER — Other Ambulatory Visit: Payer: Self-pay | Admitting: Gastroenterology

## 2018-07-08 ENCOUNTER — Ambulatory Visit: Payer: BLUE CROSS/BLUE SHIELD | Admitting: Neurology

## 2018-07-08 ENCOUNTER — Encounter

## 2018-09-14 DIAGNOSIS — H903 Sensorineural hearing loss, bilateral: Secondary | ICD-10-CM | POA: Diagnosis not present

## 2019-01-12 DIAGNOSIS — Z03818 Encounter for observation for suspected exposure to other biological agents ruled out: Secondary | ICD-10-CM | POA: Diagnosis not present

## 2019-01-24 ENCOUNTER — Other Ambulatory Visit: Payer: Self-pay

## 2019-01-24 DIAGNOSIS — Z20822 Contact with and (suspected) exposure to covid-19: Secondary | ICD-10-CM

## 2019-01-25 LAB — NOVEL CORONAVIRUS, NAA: SARS-CoV-2, NAA: NOT DETECTED

## 2019-03-20 ENCOUNTER — Ambulatory Visit: Payer: BC Managed Care – PPO | Attending: Internal Medicine

## 2019-03-20 DIAGNOSIS — Z20828 Contact with and (suspected) exposure to other viral communicable diseases: Secondary | ICD-10-CM | POA: Diagnosis not present

## 2019-03-20 DIAGNOSIS — Z20822 Contact with and (suspected) exposure to covid-19: Secondary | ICD-10-CM

## 2019-03-22 LAB — NOVEL CORONAVIRUS, NAA: SARS-CoV-2, NAA: NOT DETECTED

## 2019-03-26 IMAGING — MR MR LUMBAR SPINE WO/W CM
4 of 7 series · 24 of 48 positions shown · IV contrast (MH)
Comparison: 08/01/2009 lumbar spine MRI.

CLINICAL DATA: 52 y/o M; lumbar pain radiating to the lower
extremities with numbness after moving heavy objects.

EXAM:
MRI LUMBAR SPINE WITHOUT AND WITH CONTRAST
TECHNIQUE: Multiplanar and multiecho pulse sequences of the lumbar spine were
obtained without and with intravenous contrast.
CONTRAST:  7 cc Gadavist

[Series 5: T2 · sagittal · 4.0mm · 0.73mm/px · 4 of 17 slices shown (1 of 2)]
[im 1/17]
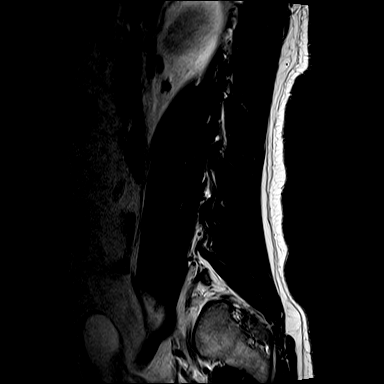
[im 6/17]
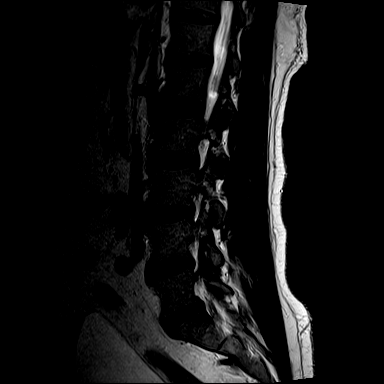
[im 11/17]
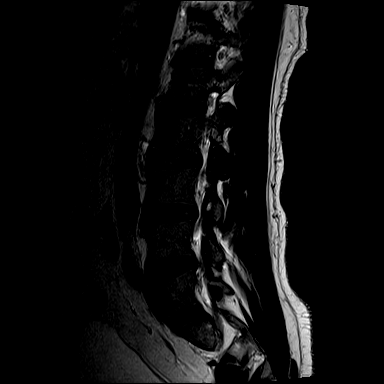
[im 17/17]
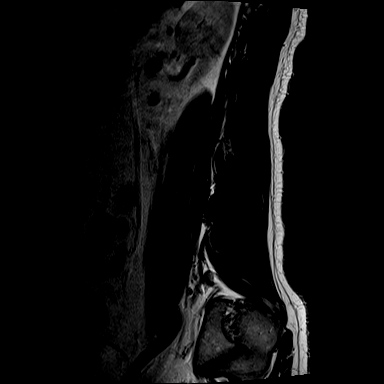

[Series 7: T1 · sagittal · 4.0mm · 0.88mm/px · 5 of 17 slices shown (1 of 2)]
[im 1/17]
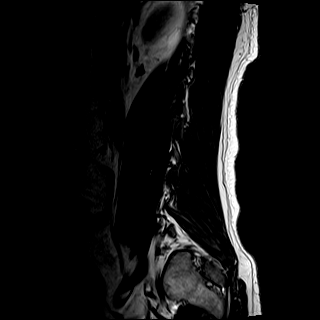
[im 5/17]
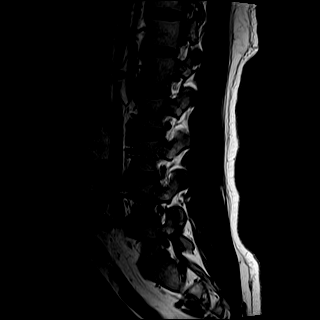
[im 9/17]
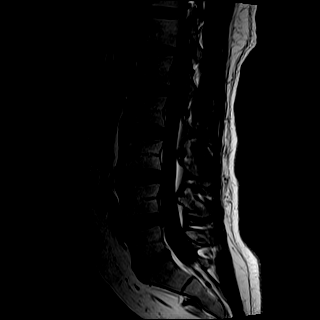
[im 13/17]
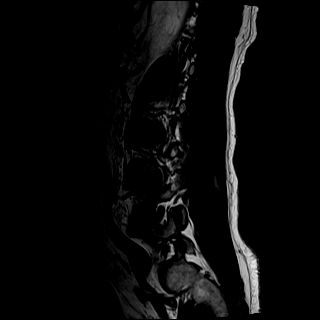
[im 17/17]
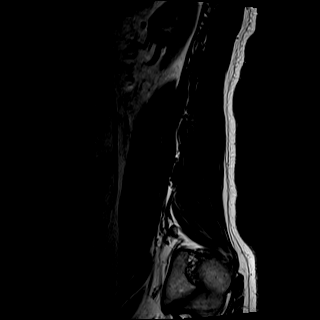

[Series 8: T2 · axial · 4.0mm · 0.57mm/px · z∈[-173,+32]mm · 8 of 34 slices shown (2 of 2)]
[im 1/34]
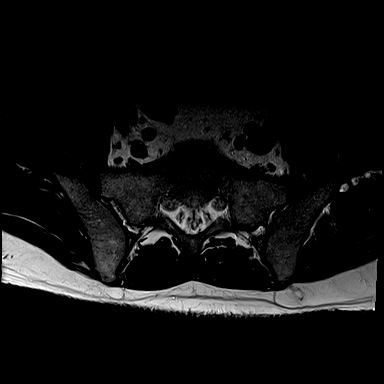
[im 4/34]
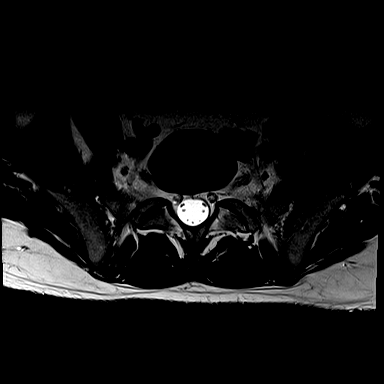
[im 12/34]
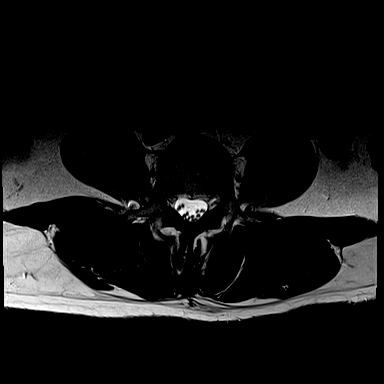
[im 15/34]
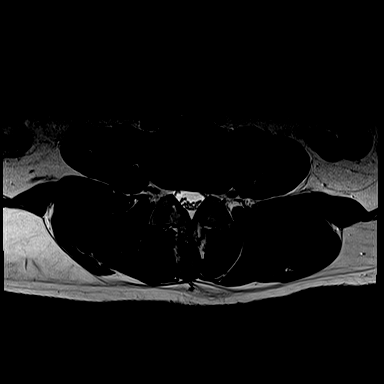
[im 19/34]
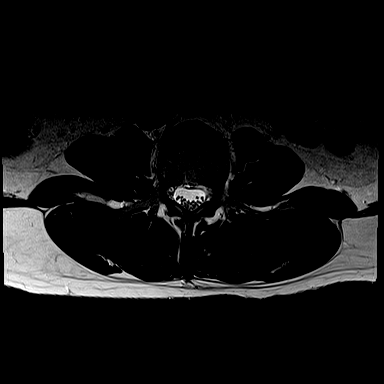
[im 23/34]
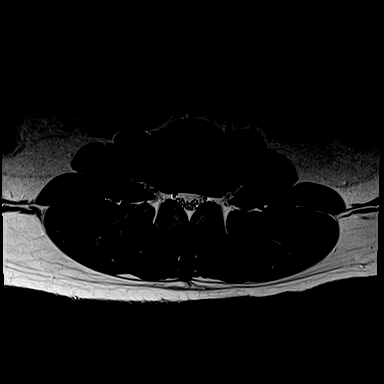
[im 30/34]
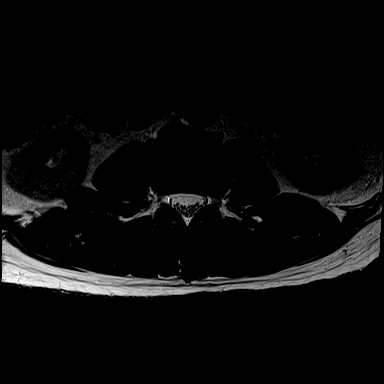
[im 34/34]
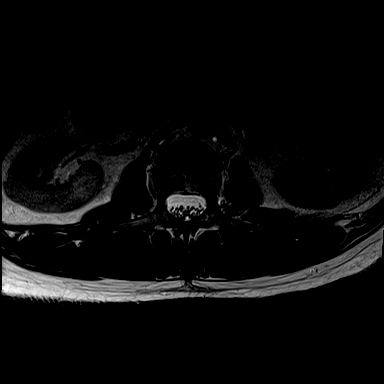

[Series 9: T1 · axial · 4.0mm · 0.34mm/px · z∈[-173,+12]mm · 7 of 34 slices shown (2 of 2)]
[im 1/34]
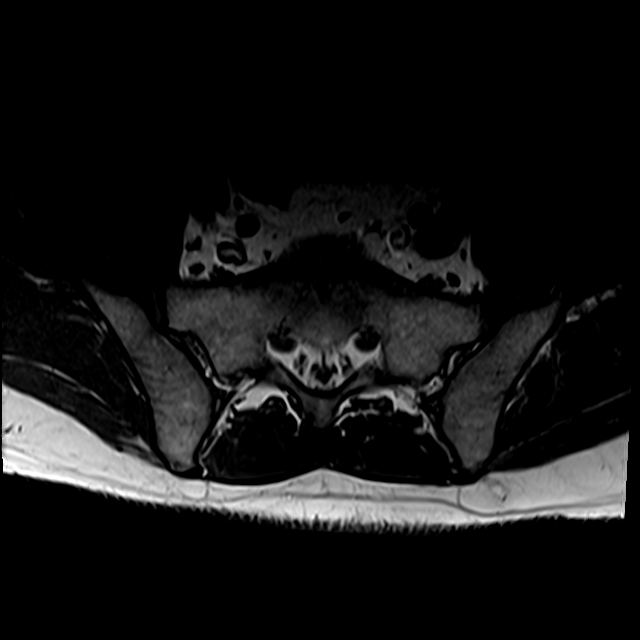
[im 4/34]
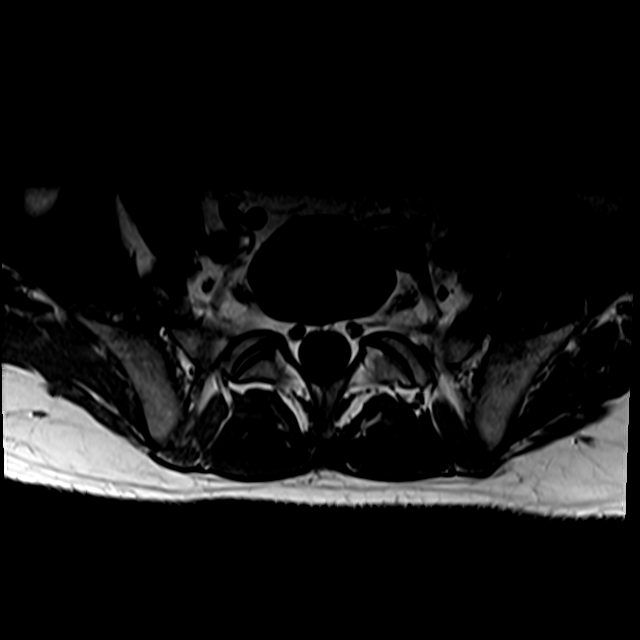
[im 12/34]
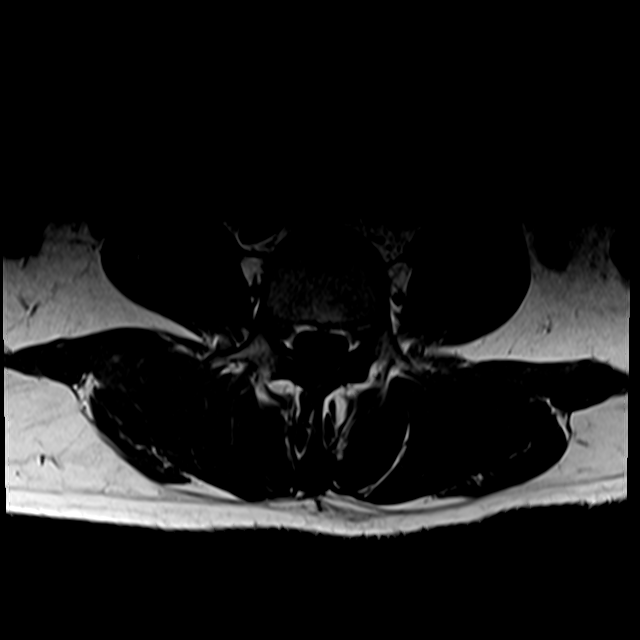
[im 15/34]
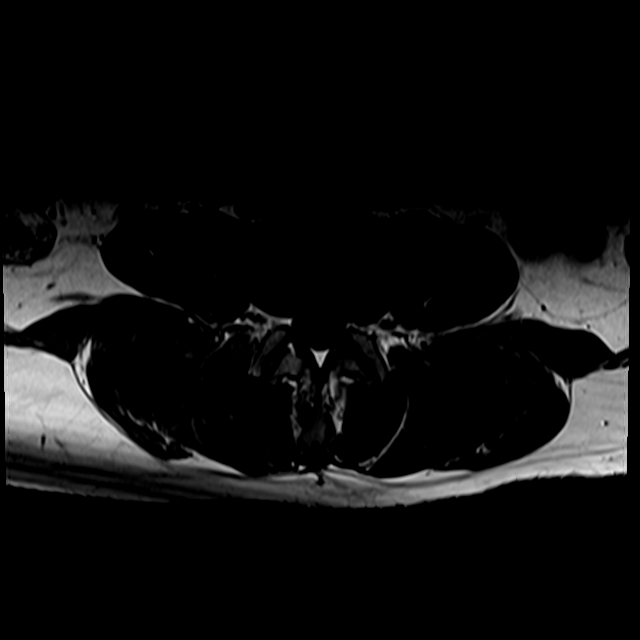
[im 19/34]
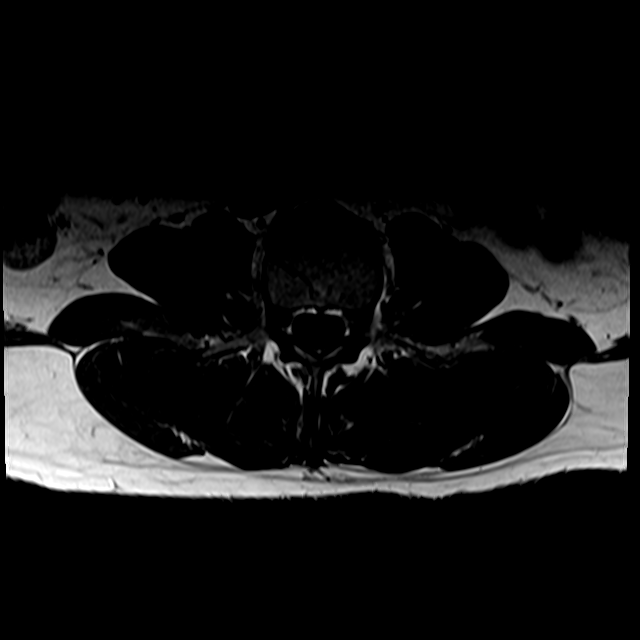
[im 23/34]
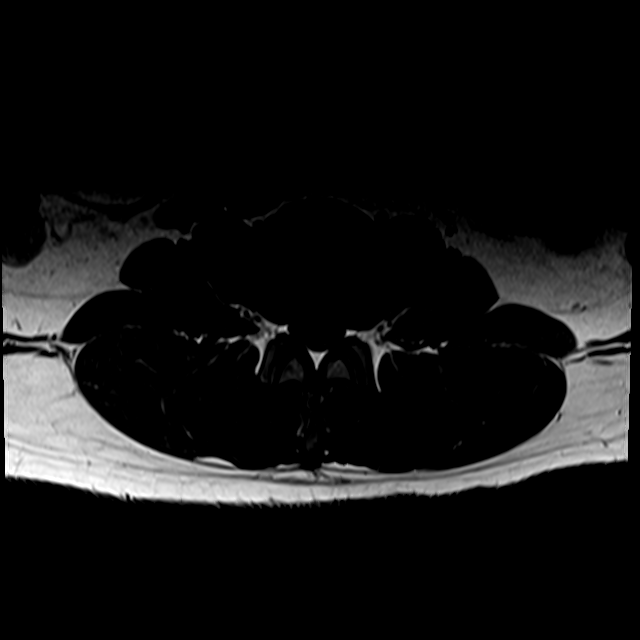
[im 30/34]
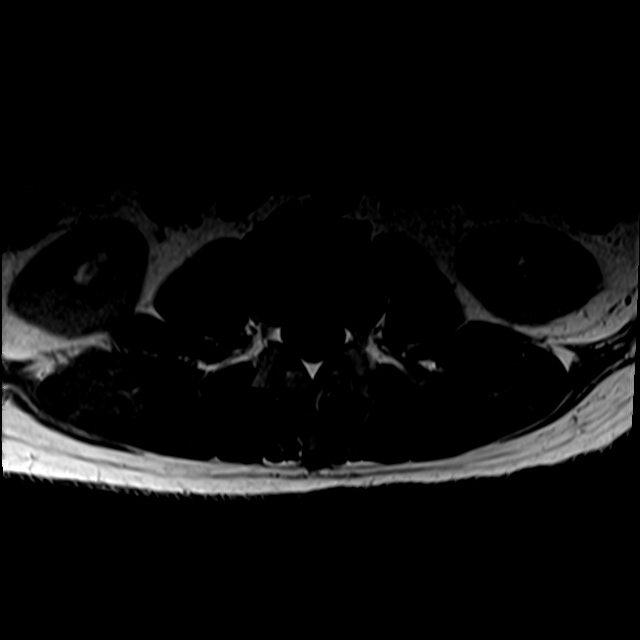

[24 of 48 positions shown; findings below may reference images not displayed]

FINDINGS: Segmentation:  Standard.

Alignment:  Physiologic.

Vertebrae: No fracture, evidence of discitis, or bone lesion. No
abnormal enhancement.

Conus medullaris and cauda equina: Conus extends to the L1 level.
Conus and cauda equina appear normal. No abnormal enhancement.

Paraspinal and other soft tissues: Negative.

Disc levels:

L1-2: No significant disc displacement, foraminal stenosis, or canal
stenosis.

L2-3: Stable mild disc bulge. No significant foraminal or canal
stenosis.

L3-4: Stable mild disc bulge. No significant foraminal or canal
stenosis.

L4-5: Stable disc bulge with left central and subarticular disc
protrusion. The protrusion narrows the left lateral recess and
results in mild left foraminal stenosis. No right foraminal or canal
stenosis.

L5-S1: Partial reduction of right central and subarticular disc
protrusion with interval decrease in disc displacement. No
significant foraminal or canal stenosis.
IMPRESSION: 1. No acute osseous abnormality or malalignment.
2. Partial interval reduction of L5-S1 disc protrusion with
decreased disc displacement. Stable mild degenerative changes at
additional lumbar levels.
3. No significant foraminal or canal stenosis.

## 2019-04-10 DIAGNOSIS — L299 Pruritus, unspecified: Secondary | ICD-10-CM | POA: Diagnosis not present

## 2019-04-10 DIAGNOSIS — Z23 Encounter for immunization: Secondary | ICD-10-CM | POA: Diagnosis not present

## 2019-04-10 DIAGNOSIS — L309 Dermatitis, unspecified: Secondary | ICD-10-CM | POA: Diagnosis not present

## 2019-04-10 DIAGNOSIS — L308 Other specified dermatitis: Secondary | ICD-10-CM | POA: Diagnosis not present

## 2019-04-12 ENCOUNTER — Other Ambulatory Visit: Payer: BC Managed Care – PPO

## 2019-04-12 ENCOUNTER — Ambulatory Visit: Payer: BC Managed Care – PPO | Attending: Internal Medicine

## 2019-04-12 DIAGNOSIS — Z20822 Contact with and (suspected) exposure to covid-19: Secondary | ICD-10-CM | POA: Insufficient documentation

## 2019-04-13 LAB — NOVEL CORONAVIRUS, NAA: SARS-CoV-2, NAA: NOT DETECTED

## 2019-04-28 DIAGNOSIS — Z4802 Encounter for removal of sutures: Secondary | ICD-10-CM | POA: Diagnosis not present

## 2019-05-15 ENCOUNTER — Telehealth: Payer: Self-pay | Admitting: Neurology

## 2019-05-15 DIAGNOSIS — M545 Low back pain: Secondary | ICD-10-CM | POA: Diagnosis not present

## 2019-05-15 NOTE — Telephone Encounter (Signed)
Reviewed with Dr. Felecia Shelling, he previously did ESI on right L4L5

## 2019-05-15 NOTE — Telephone Encounter (Signed)
Pt is asking for a brief call from Terrence Dupont, RN to discuss if it was his L1 or L5 Dr Felecia Shelling worked on for him.  Pt is having a surgical consult and would like to get a response from [place], [place] as quickly as possible.  Pt asking if it can be before 3pm today(pt made aware of RN having 24-48 hours to respond to messages)

## 2019-05-15 NOTE — Telephone Encounter (Signed)
Called, LVM for pt relaying below information (ok per DPR).

## 2019-05-22 ENCOUNTER — Other Ambulatory Visit: Payer: Self-pay | Admitting: Neurology

## 2019-05-23 ENCOUNTER — Ambulatory Visit: Payer: BC Managed Care – PPO | Attending: Internal Medicine

## 2019-05-23 ENCOUNTER — Ambulatory Visit: Payer: Self-pay

## 2019-05-23 DIAGNOSIS — Z20822 Contact with and (suspected) exposure to covid-19: Secondary | ICD-10-CM | POA: Insufficient documentation

## 2019-05-24 LAB — NOVEL CORONAVIRUS, NAA: SARS-CoV-2, NAA: NOT DETECTED

## 2019-05-25 DIAGNOSIS — L309 Dermatitis, unspecified: Secondary | ICD-10-CM | POA: Diagnosis not present

## 2019-06-13 DIAGNOSIS — L309 Dermatitis, unspecified: Secondary | ICD-10-CM | POA: Diagnosis not present

## 2019-06-14 ENCOUNTER — Telehealth: Payer: Self-pay | Admitting: *Deleted

## 2019-06-14 NOTE — Telephone Encounter (Signed)
Submitted PA rizatriptan on CMM. Key: [id number]. Waiting on determination from [employer].

## 2019-06-14 NOTE — Telephone Encounter (Signed)
Received fax from [employer] that the member belongs to a health insurance plan that they do not review. I re-submitted PA via [place]. Key: [id number]. Received instant approval. Faxed notice of approval to [facility] at [phone]. Received fax confirmation.

## 2019-06-26 DIAGNOSIS — L239 Allergic contact dermatitis, unspecified cause: Secondary | ICD-10-CM | POA: Diagnosis not present

## 2019-06-26 DIAGNOSIS — L309 Dermatitis, unspecified: Secondary | ICD-10-CM | POA: Diagnosis not present

## 2019-06-28 DIAGNOSIS — L503 Dermatographic urticaria: Secondary | ICD-10-CM | POA: Diagnosis not present

## 2019-06-30 DIAGNOSIS — L503 Dermatographic urticaria: Secondary | ICD-10-CM | POA: Diagnosis not present

## 2019-07-06 DIAGNOSIS — L503 Dermatographic urticaria: Secondary | ICD-10-CM | POA: Diagnosis not present

## 2019-07-13 ENCOUNTER — Ambulatory Visit: Payer: BLUE CROSS/BLUE SHIELD | Admitting: Neurology

## 2019-07-25 DIAGNOSIS — J309 Allergic rhinitis, unspecified: Secondary | ICD-10-CM | POA: Diagnosis not present

## 2019-07-25 DIAGNOSIS — L503 Dermatographic urticaria: Secondary | ICD-10-CM | POA: Diagnosis not present

## 2019-09-05 ENCOUNTER — Ambulatory Visit: Payer: BLUE CROSS/BLUE SHIELD | Admitting: Neurology

## 2019-09-13 ENCOUNTER — Encounter: Payer: Self-pay | Admitting: Neurology

## 2019-09-13 ENCOUNTER — Ambulatory Visit (INDEPENDENT_AMBULATORY_CARE_PROVIDER_SITE_OTHER): Payer: BC Managed Care – PPO | Admitting: Neurology

## 2019-09-13 VITALS — BP 137/89 | HR 68 | Ht 70.0 in | Wt 190.0 lb

## 2019-09-13 DIAGNOSIS — M5416 Radiculopathy, lumbar region: Secondary | ICD-10-CM | POA: Diagnosis not present

## 2019-09-13 DIAGNOSIS — R200 Anesthesia of skin: Secondary | ICD-10-CM | POA: Diagnosis not present

## 2019-09-13 DIAGNOSIS — G43009 Migraine without aura, not intractable, without status migrainosus: Secondary | ICD-10-CM | POA: Diagnosis not present

## 2019-09-13 DIAGNOSIS — G35 Multiple sclerosis: Secondary | ICD-10-CM | POA: Diagnosis not present

## 2019-09-13 DIAGNOSIS — R202 Paresthesia of skin: Secondary | ICD-10-CM

## 2019-09-13 NOTE — Progress Notes (Addendum)
[facility]  PATIENT: Willie Lucas DOB: 1965/09/03  REFERRING DOCTOR OR PCP:  Benay Pillow SOURCE: Patient and records from [facility]  _________________________________   HISTORICAL  CHIEF COMPLAINT:  Chief Complaint  Patient presents with  . Follow-up    RM 12, alone. Last seen 05/12/2018.   . Multiple Sclerosis    not on DMT  . Migraine    Takes maxalt prn    HISTORY OF PRESENT ILLNESS: Willie Lucas is a 54 y.o.. man with relapsing remitting MS.     09/13/2019: He had been off Copaxone since 2012 and has no definite exacerbations.   He has had a few episodes of mild tingling but no significant symptom.  We have done a couple steroid packs in the past but no IV.    His last MRI was 11/04/2016 at [facility].           He is walking well and stays active -- exercise, weights, running daily.    No issues with balance, strnegth or fixed numbness (occ tingling).   Bladder is fine.    He gets migraines sometimes triggerd by an alcoholic drink and/or dehydration  He was diagnosed with dermatographia.  Oral steroids helped and he is now better.    We did several ESI's for lumbar radiculopathy.  He saw Dr. Rolena Infante in 2019 for L5-S1 disc protrusion but MRI in 2019 actually looked better.     He has no recent symptoms.    05/12/2018: He was on Copaxone but has been off any DMT since 2012/2013.  Last MRI's in 2016 did not show active disease.    He has been experiencing more numbness (with prickly sensation), especially in his hands over the last few weeks.    This sensation is fluctuating but has had some degree of full body dysesthesia.     He notes no change in balance or strength.    No bladder changes.      He also notes his hands will tingle more when he is holding something superimposed on the other symptoms.   He has very mild hand weakness x months.       His LBP is doing better.  Migraines occur a few days a month and Relpax with Motrin helps most  of the time.    From 11/25/2016: Willie Lucas is a 54 year old man who was diagnosed with multiple sclerosis around 2001 who also has migraine headaches and lumbar disc degenerative changes .  About 3 weeks ago, he had hearing loss while watching TV.   Hearing returned a few minutes later.     A few days later, he had tingling and light numbness in his fingers that would intensify and then be on legs and groin.    Oral steroids helped x several days anf then stymptoms returned.    MRI of The cervical spine showed a disc protrusion at C5-C6 but no spinal cord stenosis, spinal cord will sign or nerve root compression.   MRI of the brain showed a stable pattern of T2/FLAIR hyperintense foci in the periventricular and juxtacortical white matter. No change compared to 2016.   There is no weakness or clumsiness in the arms or legs. Her function is fine. Vision is fine.  Low back pain/lumbar radiculopathy: This is currently doing well but flares up once or twice a year, most recently several months ago.  When more severe, ESI has helped. MRI of the lumbar spine dated 08/01/2009 shows disc degenerative changes at  L2-L3, L4-L5 and L5-S1. At L4-L5, there is a left paramedian disc protrusion with some encroachment on the left L5 nerve root. At L5-S1 right paramedian disc protrusion with some encroachment on the S1 nerve root.  Migraine headaches: Migraines are doing better since starting clonazepam at bedtime.   When the pain is present it is throbbing, usually unilateral and over the eye. He gets photophobia and phonophobia and moving makes the pain worse. He gets some nausea but usually does not have vomiting.  Currently, he has one bad migraine a month.   He takes sublingual Maxalt 10 mg MLT with ibuprofen and is better within 30 minutes.    They were occurring 3-4 times a month before clonazepam.      Mood:   Anxiety and sleep are helped by clonazepam and he takes one every morning and a second one many  afternoons      Multiple sclerosis History: I saw Willie Lucas a couple years after his diagnosis of multiple sclerosis. He has done very well. Initially he had several exacerbations with numbness and clumsiness and visual changes.[name] He placed on Copaxone and did not have any further exacerbations. About 5 or 6 years ago he went to every other day and year or 2 later he stopped the Copaxone. He has continued to do well with no definite exacerbations the last 5 years. An MRI was performed 09/08/2012.  It was compared to one dated 01/31/2008 andthere was only one new lesion in that 5 year interval.  There were no acute lesions on either study.  After his diagnosis he began to exercise on a very regular basis and continues to do so. He denies any significant sequela from the MS. Specifically, there is no difficulty with his gait and he notes no numbness or weakness. He does not have bladder or visual dysfunction. His mood issues with anxiety are probably unrelated to the MS. He does not note much for fatigue and remains active and travels frequently.   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  REVIEW OF SYSTEMS: Constitutional: No fevers, chills, sweats, or change in appetite Eyes: No visual changes, double vision, eye pain Ear, nose and throat: No hearing loss, ear pain, nasal congestion, sore throat Cardiovascular: No chest pain, palpitations Respiratory: No shortness of breath at rest or with exertion.   No wheezes GastrointestinaI: No nausea, vomiting, diarrhea, abdominal pain, fecal incontinence Genitourinary: No dysuria, urinary retention or frequency.  No nocturia. Musculoskeletal: See above  Integumentary: No rash, pruritus, skin lesions Neurological: as above Psychiatric: No depression at this time.  Notes anxiety Endocrine: No palpitations, diaphoresis, change in appetite, change in weigh or increased thirst Hematologic/Lymphatic: No anemia, purpura,  petechiae. Allergic/Immunologic: No itchy/runny eyes, nasal congestion, recent allergic reactions, rashes  ALLERGIES: No Known Allergies  HOME MEDICATIONS:  Current Outpatient Medications:  .  omeprazole (PRILOSEC) 40 MG capsule, TAKE 1 CAPSULE BY MOUTH EVERY DAY.NEED APPOINTMENT FOR MORE REFILLS!, Disp: 30 capsule, Rfl: 0 .  rizatriptan (MAXALT) 5 MG tablet, TAKE ONE TABLET BY MOUTH AS NEEDED FOR HEADACHE.MAY REPEAT IN 2 HOURS IF NEEDED, Disp: 10 tablet, Rfl: 11  Current Facility-Administered Medications:  .  0.9 %  sodium chloride infusion, 500 mL, Intravenous, Continuous, Ladene Artist, MD  Facility-Administered Medications Ordered in Other Visits:  .  gadopentetate dimeglumine (MAGNEVIST) injection 19 mL, 19 mL, Intravenous, Once PRN, Aariona Momon, Nanine Means, MD  PAST MEDICAL HISTORY: Past Medical History:  Diagnosis Date  . Anxiety   .  Benign fundic gland polyps of stomach   . Headache(784.0)   . MS (multiple sclerosis) ([place])   . Neuromuscular disorder ([place])   . Ulcer    hx of duodenal ulcer at age 38    PAST SURGICAL HISTORY: Past Surgical History:  Procedure Laterality Date  . RHINOPLASTY     X2    FAMILY HISTORY: Family History  Problem Relation Age of Onset  . Diabetes Mother   . Arthritis Mother   . Brain cancer Brother        died  . Colon cancer Neg Hx   . Stomach cancer Neg Hx     SOCIAL HISTORY:  Social History   Socioeconomic History  . Marital status: Divorced    Spouse name: Not on file  . Number of children: Not on file  . Years of education: Not on file  . Highest education level: Not on file  Occupational History  . Occupation: Music therapist  Tobacco Use  . Smoking status: Former Smoker    Quit date: 1998    Years since quitting: 23.4  . Smokeless tobacco: Never Used  Vaping Use  . Vaping Use: Never used  Substance and Sexual Activity  . Alcohol use: Yes    Alcohol/week: 2.0 - 3.0 standard drinks    Types: 2 - 3 Glasses of wine  per week    Comment: occ  . Drug use: No  . Sexual activity: Yes  Other Topics Concern  . Not on file  Social History Narrative  . Not on file   Social Determinants of Health   Financial Resource Strain:   . Difficulty of Paying Living Expenses:   Food Insecurity:   . Worried About Charity fundraiser in the Last Year:   . Arboriculturist in the Last Year:   Transportation Needs:   . Film/video editor (Medical):   [name] Lack of Transportation (Non-Medical):   Physical Activity:   . Days of Exercise per Week:   . Minutes of Exercise per Session:   Stress:   . Feeling of Stress :   Social Connections:   . Frequency of Communication with Friends and Family:   . Frequency of Social Gatherings with Friends and Family:   . Attends Religious Services:   . Active Member of Clubs or Organizations:   . Attends Archivist Meetings:   [name] Marital Status:   Intimate Partner Violence:   . Fear of Current or Ex-Partner:   . Emotionally Abused:   [name] Physically Abused:   . Sexually Abused:      PHYSICAL EXAM  Vitals:   09/13/19 0950  BP: 137/89  Pulse: 68  Weight: 190 lb (86.2 kg)  Height: 5\' 10"  (1.778 m)    Body mass index is 27.26 kg/m.   General: The patient is well-developed and well-nourished and in mild acute distress  Neck:  The neck is nontender.  Skin:  Extremities are without rash or edema.   Neurologic Exam  Mental status: The patient is alert and oriented x 3 at the time of the examination. The patient has apparent normal recent and remote memory, with an apparently normal attention span and concentration ability.   Speech is normal.  Cranial nerves: Extraocular movements are full.  Facial strength is normal.  Hearing appears to be normal.    Motor: Muscle bulk, tone and strength was normal  Sensory: There were no Tinel signs.  He had intact sensation to touch  temperature and vibration in the arms and legs.  Coordination: Cerebellar testing  reveals good finger-nose-finger and heel-to-shin bilaterally.  Gait and station: Station is normal.   Gait is normal. Tandem gait is normal. Romberg is negative.   Reflexes: Deep tendon reflexes are symmetric and normal bilaterally.    [name]    DIAGNOSTIC DATA (LABS, IMAGING, TESTING) - I reviewed patient records, labs, notes, testing and imaging myself where available.  Lab Results  Component Value Date   WBC 6.2 02/27/2018   HGB 16.3 02/27/2018   HCT 48.0 02/27/2018   MCV 90.4 02/27/2018   PLT 151 02/27/2018      Component Value Date/Time   NA 138 02/27/2018 1429   K 4.0 02/27/2018 1429   CL 105 02/27/2018 1429   CO2 24 02/27/2018 1414   GLUCOSE 86 02/27/2018 1429   BUN 14 02/27/2018 1429   CREATININE 1.00 02/27/2018 1429   CALCIUM 9.1 02/27/2018 1414   PROT 6.7 12/08/2016 1548   ALBUMIN 4.5 12/08/2016 1548   AST 16 12/08/2016 1548   ALT 13 12/08/2016 1548   ALKPHOS 68 12/08/2016 1548   BILITOT 0.6 12/08/2016 1548   GFRNONAA >60 02/27/2018 1414   GFRAA >60 02/27/2018 1414   Lab Results  Component Value Date   CHOL 223 (H) 04/24/2014   HDL 57.60 04/24/2014   LDLCALC 131 (H) 04/24/2014   LDLDIRECT 176.8 02/28/2013   TRIG 171.0 (H) 04/24/2014   CHOLHDL 4 04/24/2014   No results found for: HGBA1C No results found for: VITAMINB12 Lab Results  Component Value Date   TSH 1.11 02/28/2013     ASSESSMENT AND PLAN  Numbness  Multiple sclerosis ([place]) - Plan: MR BRAIN W WO CONTRAST  Common migraine without intractability  Lumbar radicular pain  Paresthesias   1.   He has been off of disease modifying therapies for about 8 years.  We will check an MRI of the brain to determine if there is any subclinical progression.  If this is occurring we would need to reconsider restarting a disease modifying therapy 2.  Stay active and exercise as tolerated 3.    Continue Maxalt for migraines.  He will return to see me in 12 months or sooner if he has new or worsening  neurologic symptoms.   Quintrell Baze A. Felecia Shelling, MD, PhD 9/56/2130, 8:65 PM Certified in Neurology, Clinical Neurophysiology, Sleep Medicine, Pain Medicine and Neuroimaging  [facility] [address] [place], [place] [address] [phone]

## 2019-09-18 ENCOUNTER — Telehealth: Payer: Self-pay | Admitting: Neurology

## 2019-09-18 NOTE — Telephone Encounter (Signed)
LVM for pt to call back about scheduling mri  [employer] Auth: [id number] (exp. 09/14/19 to 03/11/20)

## 2019-11-13 ENCOUNTER — Telehealth: Payer: Self-pay | Admitting: Neurology

## 2019-11-13 DIAGNOSIS — G35 Multiple sclerosis: Secondary | ICD-10-CM

## 2019-11-13 NOTE — Addendum Note (Signed)
Addended byRonnald Ramp, Samarion Ehle L on: 11/13/2019 05:00 PM   Modules accepted: Orders

## 2019-11-13 NOTE — Telephone Encounter (Signed)
Pt would like a call from the nurse to discuss MRI appt. Would like to include neck as well as the brain.

## 2019-11-13 NOTE — Telephone Encounter (Signed)
Yes we can order brain and cerv spine

## 2019-11-13 NOTE — Telephone Encounter (Signed)
Called pt back. Advised Dr. Felecia Shelling approved adding on MRI cerivcal. MRI brain already authorized via insurance but they will have to get MRI cervical authorized as well. He can then schedule tests together. He verbalized understanding.  He reports recently being on a boat and passed out. Thought it was heat related. Advised him to continue to monitor and if he has any more episodes, to let us know.

## 2019-11-13 NOTE — Telephone Encounter (Signed)
Dr. Felecia Shelling- would you like to order MRI cervical as well? Pt requesting

## 2019-11-14 NOTE — Telephone Encounter (Signed)
Patient returned my call he is scheduled at [facility] for 11/15/19.

## 2019-11-14 NOTE — Telephone Encounter (Signed)
Noted, left a voicemail for patient to call back about scheduling both of the MRIs.  [employer] Auth: [id number] (exp. 11/14/19 to 05/11/20)  Josem Kaufmann: [id number] (exp. 09/14/19 to 03/11/20)

## 2019-11-15 ENCOUNTER — Other Ambulatory Visit: Payer: Self-pay

## 2019-11-15 ENCOUNTER — Ambulatory Visit: Payer: BC Managed Care – PPO

## 2019-11-15 DIAGNOSIS — G35 Multiple sclerosis: Secondary | ICD-10-CM | POA: Diagnosis not present

## 2019-11-15 MED ORDER — GADOBENATE DIMEGLUMINE 529 MG/ML IV SOLN
15.0000 mL | Freq: Once | INTRAVENOUS | Status: AC | PRN
  Administered 2019-11-15: 15 mL via INTRAVENOUS

## 2019-11-22 DIAGNOSIS — Z20818 Contact with and (suspected) exposure to other bacterial communicable diseases: Secondary | ICD-10-CM | POA: Diagnosis not present

## 2019-11-22 DIAGNOSIS — R110 Nausea: Secondary | ICD-10-CM | POA: Diagnosis not present

## 2019-11-22 DIAGNOSIS — Z87891 Personal history of nicotine dependence: Secondary | ICD-10-CM | POA: Diagnosis not present

## 2019-11-22 DIAGNOSIS — N281 Cyst of kidney, acquired: Secondary | ICD-10-CM | POA: Diagnosis not present

## 2019-11-22 DIAGNOSIS — R109 Unspecified abdominal pain: Secondary | ICD-10-CM | POA: Diagnosis not present

## 2019-11-22 DIAGNOSIS — R197 Diarrhea, unspecified: Secondary | ICD-10-CM | POA: Diagnosis not present

## 2019-11-29 DIAGNOSIS — R197 Diarrhea, unspecified: Secondary | ICD-10-CM | POA: Diagnosis not present

## 2019-11-29 DIAGNOSIS — K219 Gastro-esophageal reflux disease without esophagitis: Secondary | ICD-10-CM | POA: Diagnosis not present

## 2019-12-04 ENCOUNTER — Telehealth: Payer: Self-pay | Admitting: Gastroenterology

## 2019-12-04 NOTE — Telephone Encounter (Signed)
Left message for patient to call back  

## 2019-12-04 NOTE — Telephone Encounter (Signed)
Patent having abdominal cramping diarrhea for two weeks now please advise.

## 2019-12-05 NOTE — Telephone Encounter (Signed)
Patient was seen in the ED in [place] for diarrhea and cramping he wa first at the Urgent care then sent to the ED.  He was treated with IV fluids, anti spasmodics and antireflux medications.   He will come in for an appt on 12/06/19

## 2019-12-06 ENCOUNTER — Encounter: Payer: Self-pay | Admitting: Gastroenterology

## 2019-12-06 ENCOUNTER — Other Ambulatory Visit: Payer: BC Managed Care – PPO

## 2019-12-06 ENCOUNTER — Ambulatory Visit (INDEPENDENT_AMBULATORY_CARE_PROVIDER_SITE_OTHER): Payer: BC Managed Care – PPO | Admitting: Gastroenterology

## 2019-12-06 VITALS — BP 110/70 | HR 72 | Ht 70.0 in | Wt 187.0 lb

## 2019-12-06 DIAGNOSIS — R197 Diarrhea, unspecified: Secondary | ICD-10-CM

## 2019-12-06 DIAGNOSIS — R1084 Generalized abdominal pain: Secondary | ICD-10-CM | POA: Insufficient documentation

## 2019-12-06 DIAGNOSIS — K580 Irritable bowel syndrome with diarrhea: Secondary | ICD-10-CM | POA: Diagnosis not present

## 2019-12-06 MED ORDER — CIPROFLOXACIN HCL 500 MG PO TABS: 500.0000 mg | ORAL_TABLET | Freq: Two times a day (BID) | ORAL | 0 refills | Status: AC

## 2019-12-06 MED ORDER — DICYCLOMINE HCL 10 MG PO CAPS: 10.0000 mg | ORAL_CAPSULE | Freq: Three times a day (TID) | ORAL | 1 refills | Status: DC

## 2019-12-06 NOTE — Progress Notes (Signed)
Reviewed and agree with management plan.  Amenah Tucci T. Lashane Whelpley, MD FACG [facility]  

## 2019-12-06 NOTE — Patient Instructions (Signed)
If you are age 53 or older, your body mass index should be between 23-30. Your Body mass index is 26.83 kg/m. If this is out of the aforementioned range listed, please consider follow up with your Primary Care Provider.  If you are age 39 or younger, your body mass index should be between 19-25. Your Body mass index is 26.83 kg/m. If this is out of the aformentioned range listed, please consider follow up with your Primary Care Provider.   Your provider has requested that you go to the basement level for lab work before leaving today. Press "B" on the elevator. The lab is located at the first door on the left as you exit the elevator.  We have sent the following medications to your pharmacy for you to pick up at your convenience: Dicyclomine 10 mg three times daily. Cipro 500 mg twice daily for 5 days.  Start probiotic twice daily such as Florastor, [employer], VSL #3.

## 2019-12-06 NOTE — Progress Notes (Signed)
12/06/2019 Willie Lucas [id number] 1965-10-14   HISTORY OF PRESENT ILLNESS: This is a 54 year old male who is patient of Dr. Silvio Pate.  Has history of IBS-D.  Was last seen here in 2019.  At that time his IBS symptoms were well controlled on dicyclomine.  He presents to our office today with complaints of sudden onset diarrhea and crampy abdominal pain that began 2.5 weeks ago.  He says that from an IBS standpoint he was doing very well even without medication.  Then he was visiting in [place] and all of a sudden one morning he woke up with diarrhea and severe cramping abdominal pain.  He went to the emergency department and had labs performed as well as a CT scan.  He says that these were all unremarkable.  They gave him Pepcid and dicyclomine.  He says that symptoms have improved very slightly.  He is no longer having chills or feverish type symptoms, but is still very distressed by the diarrhea.  He says that within 30 minutes of eating he gets severe cramping abdominal pain and urgent need to move his bowels.  He says that he has lost about 6 pounds.  Reports a lot of associated gas.  No sign of bleeding.   Past Medical History:  Diagnosis Date  . Anxiety   . Benign fundic gland polyps of stomach   . Headache(784.0)   . MS (multiple sclerosis) ([place])   . Neuromuscular disorder ([place])   . Ulcer    hx of duodenal ulcer at age 79   Past Surgical History:  Procedure Laterality Date  . COLONOSCOPY    . RHINOPLASTY     X2    reports that he quit smoking about 23 years ago. He has never used smokeless tobacco. He reports current alcohol use of about 2.0 - 3.0 standard drinks of alcohol per week. He reports that he does not use drugs. family history includes Arthritis in his mother; Brain cancer in his brother; Diabetes in his mother; Suicidality in his father. No Known Allergies    Outpatient Encounter Medications as of 12/06/2019  Medication Sig  . omeprazole (PRILOSEC) 40 MG capsule  TAKE 1 CAPSULE BY MOUTH EVERY DAY.NEED APPOINTMENT FOR MORE REFILLS!  . rizatriptan (MAXALT) 5 MG tablet TAKE ONE TABLET BY MOUTH AS NEEDED FOR HEADACHE.MAY REPEAT IN 2 HOURS IF NEEDED   Facility-Administered Encounter Medications as of 12/06/2019  Medication  . gadopentetate dimeglumine (MAGNEVIST) injection 19 mL  . [DISCONTINUED] 0.9 %  sodium chloride infusion     REVIEW OF SYSTEMS  : All other systems reviewed and negative except where noted in the History of Present Illness.   PHYSICAL EXAM: BP 110/70   Pulse 72   Ht 5\' 10"  (1.778 m)   Wt 187 lb (84.8 kg)   BMI 26.83 kg/m  General: Well developed white male in no acute distress Head: Normocephalic and atraumatic Eyes:  Sclerae anicteric, conjunctiva pink. Ears: Normal auditory acuity Lungs: Clear throughout to auscultation; no W/R/R. Heart: Regular rate and rhythm; no M/R/G. Abdomen: Soft, non-distended.  BS present.  Non-tender. Musculoskeletal: Symmetrical with no gross deformities  Skin: No lesions on visible extremities Extremities: No edema  Neurological: Alert oriented x 4, grossly non-focal Psychological:  Alert and cooperative. Normal mood and affect  ASSESSMENT AND PLAN: *54 year old male with history of IBS-D who presents with 2-1/2-week history of diarrhea and cramping abdominal pain.  Had been doing well without medication from an IBS standpoint.  Diarrhea  was very sudden in onset.  CT scan at the ER in [place] state was reportedly unremarkable as were labs.  We will try to request those records.  Certainly sounds like it was infectious in origin.  Symptoms have persisted to a somewhat lesser degree, but still very distressing to him.  I am ordering stool studies in the form of GI pathogen panel.  We will empirically start him on a course of Cipro 500 mg twice daily for 5 days.  I have asked him to begin taking probiotic in the form of Florastor, align, or VSL #3.  We will continue dicyclomine 10 mg 3 times daily.   Prescriptions for Cipro and dicyclomine were sent to his pharmacy.   CC:  Wendie Agreste, MD

## 2019-12-07 ENCOUNTER — Other Ambulatory Visit: Payer: BC Managed Care – PPO

## 2019-12-07 DIAGNOSIS — K580 Irritable bowel syndrome with diarrhea: Secondary | ICD-10-CM | POA: Diagnosis not present

## 2019-12-07 DIAGNOSIS — R1084 Generalized abdominal pain: Secondary | ICD-10-CM

## 2019-12-07 DIAGNOSIS — R197 Diarrhea, unspecified: Secondary | ICD-10-CM

## 2019-12-08 LAB — CLOSTRIDIUM DIFFICILE TOXIN B, QUALITATIVE, REAL-TIME PCR: Toxigenic C. Difficile by PCR: NOT DETECTED

## 2019-12-10 LAB — GI PROFILE, STOOL, PCR

## 2019-12-12 ENCOUNTER — Telehealth: Payer: Self-pay | Admitting: Gastroenterology

## 2019-12-12 NOTE — Telephone Encounter (Signed)
See results note 9/21

## 2020-04-01 DIAGNOSIS — L503 Dermatographic urticaria: Secondary | ICD-10-CM | POA: Diagnosis not present

## 2020-04-01 DIAGNOSIS — J309 Allergic rhinitis, unspecified: Secondary | ICD-10-CM | POA: Diagnosis not present

## 2020-06-10 ENCOUNTER — Other Ambulatory Visit: Payer: Self-pay | Admitting: Neurology

## 2020-06-21 ENCOUNTER — Ambulatory Visit: Payer: Self-pay | Admitting: Nurse Practitioner

## 2020-06-24 ENCOUNTER — Ambulatory Visit (INDEPENDENT_AMBULATORY_CARE_PROVIDER_SITE_OTHER): Payer: BC Managed Care – PPO | Admitting: Physician Assistant

## 2020-06-24 ENCOUNTER — Other Ambulatory Visit: Payer: Self-pay

## 2020-06-24 ENCOUNTER — Encounter: Payer: Self-pay | Admitting: Physician Assistant

## 2020-06-24 VITALS — BP 110/72 | HR 72 | Ht 70.0 in | Wt 196.2 lb

## 2020-06-24 DIAGNOSIS — K629 Disease of anus and rectum, unspecified: Secondary | ICD-10-CM

## 2020-06-24 DIAGNOSIS — K648 Other hemorrhoids: Secondary | ICD-10-CM

## 2020-06-24 DIAGNOSIS — Z86010 Personal history of colonic polyps: Secondary | ICD-10-CM | POA: Diagnosis not present

## 2020-06-24 MED ORDER — HYDROCORTISONE ACETATE 25 MG RE SUPP: 25.0000 mg | Freq: Every day | RECTAL | 5 refills | Status: DC

## 2020-06-24 NOTE — Progress Notes (Signed)
Subjective:    Patient ID: Willie Lucas, male    DOB: 09-Jul-1965, 55 y.o.   MRN: [id number]  HPI Willie Lucas is a 55 year old white male, established with Dr. Fuller Plan who comes in today with concerns about a small anal bump that he had just recently noticed. Patient has history of adenomatous colon polyps, diverticulosis, prior history of peptic ulcer disease, IBS and multiple sclerosis. He had colonoscopy and EGD in November 2018.  At EGD was noted to have multiple small sessile gastric polyps measuring 4 to 7 mm and otherwise negative exam.  Biopsy of 1 of these polyp showed fundic gland polyps.  Colonoscopy November 2018 he was found to have grade 1 internal hemorrhoids and a few diverticuli on the left, no polyps and otherwise negative exam.  He is indicated for 5-year interval follow-up. Patient says he has had some mild intermittent symptoms from the internal hemorrhoids, he had been using a steroid cream periodically and recently switched to a suppository which she has been using as needed.  He says occasionally he will have some discomfort or swelling has not had any bleeding.  Recently with trying to place a rectal suppository he noticed a small lesion or bump on the anus which concerned him.  This has not been tender or irritated.  Patient says he has had prior history of genital warts.  Review of Systems Pertinent positive and negative review of systems were noted in the above HPI section.  All other review of systems was otherwise negative.  Outpatient Encounter Medications as of 06/24/2020  Medication Sig  . hydrocortisone (ANUSOL-HC) 25 MG suppository Place 1 suppository (25 mg total) rectally at bedtime. X 5-7 nights and as needed for hemorrhoid symptoms  . omeprazole (PRILOSEC) 40 MG capsule TAKE 1 CAPSULE BY MOUTH EVERY DAY.NEED APPOINTMENT FOR MORE REFILLS!  . rizatriptan (MAXALT) 5 MG tablet TAKE ONE TABLET BY MOUTH AS NEEDED FOR HEADACHE.MAY REPEAT IN 2 HOURS IF NEEDED  . [DISCONTINUED]  dicyclomine (BENTYL) 10 MG capsule Take 1 capsule (10 mg total) by mouth 3 (three) times daily before meals.   Facility-Administered Encounter Medications as of 06/24/2020  Medication  . gadopentetate dimeglumine (MAGNEVIST) injection 19 mL   No Known Allergies Patient Active Problem List   Diagnosis Date Noted  . Acute diarrhea 12/06/2019  . Generalized abdominal pain 12/06/2019  . Irritable bowel syndrome with diarrhea 12/06/2019  . Numbness 05/12/2018  . Paresthesias 11/25/2016  . Common migraine without intractability 07/04/2014  . Exercise-induced reactive airway disease 04/24/2014  . Pure hypercholesterolemia 03/08/2013  . SCIATICA 07/16/2009  . MUSCLE SPASM, LUMBOSACRAL REGION 07/16/2009  . Lumbar radicular pain 07/16/2009  . URI 04/30/2009  . MULTIPLE SCLEROSIS 03/28/2009  . ACUT PEPTC ULCR UNS SITE W/HEM W/O MENTION OBST 03/28/2009  . HEADACHE 03/28/2009   Social History   Socioeconomic History  . Marital status: Married    Spouse name: Not on file  . Number of children: 2  . Years of education: Not on file  . Highest education level: Not on file  Occupational History  . Occupation: Music therapist  Tobacco Use  . Smoking status: Former Smoker    Quit date: 1998    Years since quitting: 24.2  . Smokeless tobacco: Never Used  Vaping Use  . Vaping Use: Never used  Substance and Sexual Activity  . Alcohol use: Yes    Alcohol/week: 2.0 - 3.0 standard drinks    Types: 2 - 3 Glasses of wine per week  Comment: occ  . Drug use: No  . Sexual activity: Yes    Partners: Female  Other Topics Concern  . Not on file  Social History Narrative  . Not on file   Social Determinants of Health   Financial Resource Strain: Not on file  Food Insecurity: Not on file  Transportation Needs: Not on file  Physical Activity: Not on file  Stress: Not on file  Social Connections: Not on file  Intimate Partner Violence: Not on file    Willie Lucas's family history includes  Arthritis in his mother; Brain cancer in his brother; Diabetes in his mother; Suicidality in his father.      Objective:    Vitals:   06/24/20 0940  BP: 110/72  Pulse: 72    Physical Exam Well-developed well-nourished WM  in no acute distress.  Height, Weight, BMI 28.15  HEENT; nontraumatic normocephalic, EOMI, PE R LA, sclera anicteric. Oropharynx; not examined today Neck; supple, no JVD  Rectal; small external hemorrhoidal tag posteriorly there is a very small perhaps 4 to 5 mm lesion on the left anus with firm base question very small anal condyloma versus polyp, no prolapsing internal hemorrhoids, nontender on digital exam Skin; benign exam, no jaundice rash or appreciable lesions Extremities; no clubbing cyanosis or edema skin warm and dry Neuro/Psych; alert and oriented x4, grossly nonfocal mood and affect appropriate       Assessment & Plan:   #32 55 year old white male with very small anal lesion (4 to 5 mm) nontender, unclear whether this is a very small condyloma versus anal polyp.  #2 history of adenomatous colon polyps-up-to-date with colonoscopy last done November 2018 and indicated for 5-year interval follow-up.  No polyps at the time of last colonoscopy #3  Grade 1 internal hemorrhoids-intermittently symptomatic with swelling and/or discomfort 4.  Gastric fundic gland polyps 5.  IBS 6.  Multiple sclerosis #7 GERD   Plan; Patient will referred to [facility] surgery for evaluation/removal of the anal lesion Start Anusol HC suppositories 1 per rectum nightly x5 to 7 days as needed internal hemorrhoidal symptoms Plan follow-up colonoscopy November 2023 Continue omeprazole 40 mg p.o. every morning Patient will follow up with Dr. Fuller Plan or myself on an as-needed basis.  Willie Lucas S Maegen Wigle PA-C 06/24/2020   Cc: No ref. provider found

## 2020-06-24 NOTE — Patient Instructions (Addendum)
You have been scheduled for an appointment with _________ at [facility]. Your appointment is on __________ at __________. Please arrive at _______________ for registration. Make certain to bring a list of current medications, including any over the counter medications or vitamins. Also bring your co-pay if you have one as well as your insurance cards. [facility] is located at [address], [address]. Should you need to reschedule your appointment, please contact them at [phone].  We have sent the following medications to your pharmacy for you to pick up at your convenience: Anusol [facility] suppositories  You will be due for a recall colonoscopy in 01/2022. We will send you a reminder in the mail when it gets closer to that time.  If you are age 54 or younger, your body mass index should be between 19-25. Your Body mass index is 28.15 kg/m. If this is out of the aformentioned range listed, please consider follow up with your Primary Care Provider.   Due to recent changes in healthcare laws, you may see the results of your imaging and laboratory studies on MyChart before your provider has had a chance to review them.  We understand that in some cases there may be results that are confusing or concerning to you. Not all laboratory results come back in the same time frame and the provider may be waiting for multiple results in order to interpret others.  Please give [place] 48 hours in order for your provider to thoroughly review all the results before contacting the office for clarification of your results.

## 2020-06-24 NOTE — Progress Notes (Signed)
Reviewed and agree with management plan.  [place] Jr T. Jacub Waiters, MD FACG [phone]  

## 2020-07-29 DIAGNOSIS — Z8619 Personal history of other infectious and parasitic diseases: Secondary | ICD-10-CM | POA: Diagnosis not present

## 2020-07-29 DIAGNOSIS — K644 Residual hemorrhoidal skin tags: Secondary | ICD-10-CM | POA: Diagnosis not present

## 2020-07-29 DIAGNOSIS — K641 Second degree hemorrhoids: Secondary | ICD-10-CM | POA: Diagnosis not present

## 2020-08-23 DIAGNOSIS — H348322 Tributary (branch) retinal vein occlusion, left eye, stable: Secondary | ICD-10-CM | POA: Diagnosis not present

## 2020-09-09 ENCOUNTER — Ambulatory Visit (INDEPENDENT_AMBULATORY_CARE_PROVIDER_SITE_OTHER): Payer: BC Managed Care – PPO | Admitting: Family Medicine

## 2020-09-09 ENCOUNTER — Other Ambulatory Visit: Payer: Self-pay

## 2020-09-09 ENCOUNTER — Encounter: Payer: BC Managed Care – PPO | Admitting: Family Medicine

## 2020-09-09 ENCOUNTER — Encounter: Payer: Self-pay | Admitting: Family Medicine

## 2020-09-09 VITALS — BP 128/74 | HR 74 | Temp 98.1°F | Resp 15 | Ht 70.0 in | Wt 194.2 lb

## 2020-09-09 DIAGNOSIS — S39012A Strain of muscle, fascia and tendon of lower back, initial encounter: Secondary | ICD-10-CM

## 2020-09-09 DIAGNOSIS — Z114 Encounter for screening for human immunodeficiency virus [HIV]: Secondary | ICD-10-CM

## 2020-09-09 DIAGNOSIS — E785 Hyperlipidemia, unspecified: Secondary | ICD-10-CM | POA: Diagnosis not present

## 2020-09-09 DIAGNOSIS — Z23 Encounter for immunization: Secondary | ICD-10-CM | POA: Diagnosis not present

## 2020-09-09 DIAGNOSIS — Z1159 Encounter for screening for other viral diseases: Secondary | ICD-10-CM

## 2020-09-09 LAB — COMPREHENSIVE METABOLIC PANEL
ALT: 15 U/L (ref 0–53)
AST: 17 U/L (ref 0–37)
Albumin: 4.2 g/dL (ref 3.5–5.2)
Alkaline Phosphatase: 62 U/L (ref 39–117)
BUN: 18 mg/dL (ref 6–23)
CO2: 28 mEq/L (ref 19–32)
Calcium: 8.7 mg/dL (ref 8.4–10.5)
Chloride: 108 mEq/L (ref 96–112)
Creatinine, Ser: 0.93 mg/dL (ref 0.40–1.50)
GFR: 93.72 mL/min (ref >60.00)
Glucose, Bld: 87 mg/dL (ref 70–99)
Potassium: 4.6 mEq/L (ref 3.5–5.1)
Sodium: 141 mEq/L (ref 135–145)
Total Bilirubin: 0.4 mg/dL (ref 0.2–1.2)
Total Protein: 5.9 g/dL — ABNORMAL LOW (ref 6.0–8.3)

## 2020-09-09 LAB — LIPID PANEL
Cholesterol: 258 mg/dL — ABNORMAL HIGH (ref 0–200)
HDL: 41.1 mg/dL (ref >39.00)
NonHDL: 216.62
Total CHOL/HDL Ratio: 6
Triglycerides: 253 mg/dL — ABNORMAL HIGH (ref 0.0–149.0)
VLDL: 50.6 mg/dL — ABNORMAL HIGH (ref 0.0–40.0)

## 2020-09-09 LAB — LDL CHOLESTEROL, DIRECT: Direct LDL: 147 mg/dL

## 2020-09-09 MED ORDER — PREDNISONE 20 MG PO TABS: ORAL_TABLET | ORAL | 0 refills | Status: DC

## 2020-09-09 MED ORDER — ATORVASTATIN CALCIUM 10 MG PO TABS: 10.0000 mg | ORAL_TABLET | Freq: Every day | ORAL | 0 refills | Status: DC

## 2020-09-09 MED ORDER — CLONAZEPAM 0.5 MG PO TABS: 0.5000 mg | ORAL_TABLET | Freq: Two times a day (BID) | ORAL | 1 refills | Status: DC | PRN

## 2020-09-09 NOTE — Progress Notes (Signed)
Subjective:  Patient ID: Willie Lucas, male    DOB: December 04, 1965  Age: 55 y.o. MRN: [id number]  CC:  Chief Complaint  Patient presents with   Hypertension    Pt reports does not take BP at home took the other day, had 1 high 1 regular number, ocular vein occlusion noted the other day, pt denies physical sxs   Hyperlipidemia    Pt reports has worked some on diet and exercise to improve these numbers not on medication at this time.    Immunizations    Pt due for Shingles and tetanus vaccine today willing to do both today.     HPI Willie Lucas presents for   New patient to me.  No recent PCP.   Social research officer, government.  History of relapsing remitting multiple sclerosis, migraine headaches, followed by Dr. Felecia Shelling with neurology.  Neuro note reviewed from June 2021: off Copaxone since 2012.  No definite exacerbations.  Also with history of lumbar radiculopathy, status post ESI, also followed by Dr. Rolena Infante.  History of dermatographia treated with oral steroids previously.  Allergist, Dr. Orvil Feil.  Migraines triggered by alcoholic drink or dehydration.  Treated with Maxalt as needed.  History of IBS-D, gastroenterology Dr. Fuller Plan.  Treated in September for possible infectious diarrhea with Cipro, Florastor.Dicyclomine 10 mg 3 times daily.  MRi brain 11/15/19 - no new lesions. Has appt soon with neuro.  Has received steroid injections from Dr. Felecia Shelling. Has used other mm relaxants in past, but ultimately klonopin has worked well sort term with steroid pack - last used over a year. Working in yard yesterday am pain with leaning forward.  - feels like blew disc out. Had to use cane, sore this am. took motirin and leftover klonopin this am. Took klonopin 3/4mg  x 2 and 800mg  tid motrin yesterday. . Worse yesterday - 9/10. 7/10 today.   Controlled substance database (PDMP) reviewed. No concerns appreciated. No listings (reports prior klonopin from 2018)  No bowel or bladder incontinence, no saddle  anesthesia, legs feel like give out d/t pain, but able to walk into office. Heat, ice , rest yesterday.   MRI December 2019 of lumbar spine with partial interval reduction of the L5-S1 disc protrusion with decreased disc displacement.  Stable mild degenerative changes at additional lumbar levels.     Hyperlipidemia: No current medication, last tested in 2016. Recent optometry visit - noted some occlusion on retinal veins - concern about underlying HTN or HLD.  No hx of htn.  Fam hx of hld - mom and sister on statins.   Lab Results  Component Value Date   CHOL 223 (H) 04/24/2014   HDL 57.60 04/24/2014   LDLCALC 131 (H) 04/24/2014   LDLDIRECT 176.8 02/28/2013   TRIG 171.0 (H) 04/24/2014   CHOLHDL 4 04/24/2014   Lab Results  Component Value Date   ALT 13 12/08/2016   AST 16 12/08/2016   ALKPHOS 68 12/08/2016   BILITOT 0.6 12/08/2016      History Patient Active Problem List   Diagnosis Date Noted   Acute diarrhea 12/06/2019   Generalized abdominal pain 12/06/2019   Irritable bowel syndrome with diarrhea 12/06/2019   Numbness 05/12/2018   Paresthesias 11/25/2016   Common migraine without intractability 07/04/2014   Exercise-induced reactive airway disease 04/24/2014   Pure hypercholesterolemia 03/08/2013   SCIATICA 07/16/2009   MUSCLE SPASM, LUMBOSACRAL REGION 07/16/2009   Lumbar radicular pain 07/16/2009   URI 04/30/2009   MULTIPLE SCLEROSIS 03/28/2009   ACUT PEPTC ULCR  UNS SITE W/HEM W/O MENTION OBST 03/28/2009   HEADACHE 03/28/2009   Past Medical History:  Diagnosis Date   Anxiety    Benign fundic gland polyps of stomach    Headache(784.0)    MS (multiple sclerosis) (HCC)    Neuromuscular disorder (HCC)    Ulcer    hx of duodenal ulcer at age 47   Past Surgical History:  Procedure Laterality Date   COLONOSCOPY     RHINOPLASTY     X2   No Known Allergies Prior to Admission medications   Medication Sig Start Date End Date Taking? Authorizing Provider   hydrocortisone (ANUSOL-HC) 25 MG suppository Place 1 suppository (25 mg total) rectally at bedtime. X 5-7 nights and as needed for hemorrhoid symptoms 06/24/20   Esterwood, Amy S, PA-C  omeprazole (PRILOSEC) 40 MG capsule TAKE 1 CAPSULE BY MOUTH EVERY DAY.NEED APPOINTMENT FOR MORE REFILLS! 07/06/18   Ladene Artist, MD  rizatriptan (MAXALT) 5 MG tablet TAKE ONE TABLET BY MOUTH AS NEEDED FOR HEADACHE.MAY REPEAT IN 2 HOURS IF NEEDED 06/10/20   Sater, Nanine Means, MD   Social History   Socioeconomic History   Marital status: Married    Spouse name: Not on file   Number of children: 2   Years of education: Not on file   Highest education level: Not on file  Occupational History   Occupation: Music therapist  Tobacco Use   Smoking status: Former    Pack years: 0.00    Types: Cigarettes    Quit date: 1998    Years since quitting: 24.4   Smokeless tobacco: Never  Vaping Use   Vaping Use: Never used  Substance and Sexual Activity   Alcohol use: Yes    Alcohol/week: 2.0 - 3.0 standard drinks    Types: 2 - 3 Glasses of wine per week    Comment: occ   Drug use: No   Sexual activity: Yes    Partners: Female  Other Topics Concern   Not on file  Social History Narrative   Not on file   Social Determinants of Health   Financial Resource Strain: Not on file  Food Insecurity: Not on file  Transportation Needs: Not on file  Physical Activity: Not on file  Stress: Not on file  Social Connections: Not on file  Intimate Partner Violence: Not on file    Review of Systems  Constitutional:  Negative for fatigue and unexpected weight change.  Eyes:  Negative for visual disturbance.  Respiratory:  Negative for cough, chest tightness and shortness of breath.   Cardiovascular:  Negative for chest pain, palpitations and leg swelling.  Gastrointestinal:  Negative for abdominal pain and blood in stool.  Neurological:  Negative for dizziness, light-headedness and headaches.    Objective:    Vitals:   09/09/20 0846  BP: 128/74  Pulse: 74  Resp: 15  Temp: 98.1 F (36.7 C)  TempSrc: Temporal  SpO2: 98%  Weight: 194 lb 3.2 oz (88.1 kg)  Height: 5\' 10"  (1.778 m)     Physical Exam Vitals reviewed.  Constitutional:      Appearance: He is well-developed.  HENT:     Head: Normocephalic and atraumatic.  Neck:     Vascular: No carotid bruit or JVD.  Cardiovascular:     Rate and Rhythm: Normal rate and regular rhythm.     Heart sounds: Normal heart sounds. No murmur heard. Pulmonary:     Effort: Pulmonary effort is normal.     Breath  sounds: Normal breath sounds. No rales.  Musculoskeletal:     Right lower leg: No edema.     Left lower leg: No edema.     Comments: Ttp lower left lumbar spine, no midline bony ttp. Pain in left low back and down left leg with seated R SLR. Able to heel/toe walk.  Reflexes 2+ at patella, Achilles bilaterally.  Skin:    General: Skin is warm and dry.  Neurological:     Mental Status: He is alert and oriented to person, place, and time.  Psychiatric:        Mood and Affect: Mood normal.     Over 40 minutes spent during visit, including chart review, neueo note review, counseling and assimilation of information, exam, discussion of plan including back pain  and chart completion.   Assessment & Plan:  Willie Lucas is a 55 y.o. male . Hyperlipidemia, unspecified hyperlipidemia type - Plan: Lipid panel, Comprehensive metabolic panel, atorvastatin (LIPITOR) 10 MG tablet  -Vascular occlusion noted by optometry with some change in vision.  Concern for underlying hypertension, hyperlipidemia. Hyperlipidemia based on labs from 2016.  Updated labs ordered, start Lipitor if elevated with potential side effects and risk versus benefits of statins discussed.  6-week follow-up  Need for shingles vaccine - Plan: Varicella-zoster vaccine IM  Need for prophylactic vaccination against diphtheria and tetanus - Plan: Tdap vaccine greater than or  equal to 7yo IM  Screening for HIV (human immunodeficiency virus) - Plan: HIV Antibody (routine testing w rflx)  Need for hepatitis C screening test - Plan: Hepatitis C antibody  Strain of lumbar region, initial encounter - Plan: predniSONE (DELTASONE) 20 MG tablet, clonazePAM (KLONOPIN) 0.5 MG tablet  -History of previous lumbar strain, similar symptoms.  Has required steroid taper and Klonopin which is worked well in the past.  Has been tried on multiple other muscle relaxants reportedly and has done well with Klonopin with only rare infrequent use.  No red flags on exam, deferred imaging initially.  Start prednisone taper with potential side effects and risk discussed and short-term Klonopin prescribed with potential risks discussed as well.  RTC precautions.  6 weeks follow-up for physical if possible.  Meds ordered this encounter  Medications   atorvastatin (LIPITOR) 10 MG tablet    Sig: Take 1 tablet (10 mg total) by mouth daily.    Dispense:  90 tablet    Refill:  0   predniSONE (DELTASONE) 20 MG tablet    Sig: 3 by mouth for 3 days, then 2 by mouth for 2 days, then 1 by mouth for 2 days, then 1/2 by mouth for 2 days.    Dispense:  16 tablet    Refill:  0   clonazePAM (KLONOPIN) 0.5 MG tablet    Sig: Take 1-2 tablets (0.5-1 mg total) by mouth 2 (two) times daily as needed (spasm, back).    Dispense:  20 tablet    Refill:  1    Patient Instructions  I suspect that you will meed to be on a statin - can wait on results, but if elevated would recommend starting Lipitor daily with repeat testing in 6 weeks.   Prednisone, Klonopin prescribed for current back strain.  Okay to continue heat or ice, gentle range of motion as tolerated.  I expect symptoms to be improving this week, follow-up if worsening or new symptoms.  Please let me know if there are questions and thanks for coming in today.   Return to the clinic  or go to the nearest emergency room if any of your symptoms worsen or  new symptoms occur.      Signed,   Merri Ray, MD [place], [facility] 09/09/20 9:45 AM

## 2020-09-09 NOTE — Patient Instructions (Addendum)
I suspect that you will meed to be on a statin - can wait on results, but if elevated would recommend starting Lipitor daily with repeat testing in 6 weeks.   Prednisone, Klonopin prescribed for current back strain.  Okay to continue heat or ice, gentle range of motion as tolerated.  I expect symptoms to be improving this week, follow-up if worsening or new symptoms.  Please let me know if there are questions and thanks for coming in today.   Return to the clinic or go to the nearest emergency room if any of your symptoms worsen or new symptoms occur.

## 2020-09-10 ENCOUNTER — Encounter: Payer: Self-pay | Admitting: Family Medicine

## 2020-09-10 LAB — HIV ANTIBODY (ROUTINE TESTING W REFLEX): HIV 1&2 Ab, 4th Generation: NONREACTIVE

## 2020-09-10 LAB — HEPATITIS C ANTIBODY
Hepatitis C Ab: NONREACTIVE
SIGNAL TO CUT-OFF: 0.01 (ref <1.00)

## 2020-09-18 ENCOUNTER — Ambulatory Visit (INDEPENDENT_AMBULATORY_CARE_PROVIDER_SITE_OTHER): Payer: BC Managed Care – PPO | Admitting: Neurology

## 2020-09-18 ENCOUNTER — Encounter: Payer: Self-pay | Admitting: Neurology

## 2020-09-18 VITALS — BP 121/81 | HR 95 | Ht 70.0 in | Wt 194.5 lb

## 2020-09-18 DIAGNOSIS — G43009 Migraine without aura, not intractable, without status migrainosus: Secondary | ICD-10-CM | POA: Diagnosis not present

## 2020-09-18 DIAGNOSIS — M5416 Radiculopathy, lumbar region: Secondary | ICD-10-CM | POA: Diagnosis not present

## 2020-09-18 DIAGNOSIS — G35 Multiple sclerosis: Secondary | ICD-10-CM

## 2020-09-18 DIAGNOSIS — M545 Low back pain, unspecified: Secondary | ICD-10-CM

## 2020-09-18 DIAGNOSIS — R200 Anesthesia of skin: Secondary | ICD-10-CM | POA: Diagnosis not present

## 2020-09-18 NOTE — Progress Notes (Signed)
[facility]  PATIENT: Willie Lucas DOB: 1965/08/15  REFERRING DOCTOR OR PCP:  Benay Pillow SOURCE: Patient and records from [place] [facility]  _________________________________   HISTORICAL  CHIEF COMPLAINT:  Chief Complaint  Patient presents with   Follow-up    RM 14 ,alone. Last seen 09/13/2019. Off DMT for MS. Takes maxalt prn for migraines. Had low back pain last week/took oral prednisone for slipped disc. Still sore, requesting injection if possible.     HISTORY OF PRESENT ILLNESS: Willie Lucas is a 55 y.o.. man with relapsing remitting MS.     09/18/2020 Willie Lucas had been off Copaxone since 2012 and has no definite exacerbations.   Willie Lucas has had a few episodes of mild tingling but no significant symptom.  We have done a couple steroid packs in the past but no IV. His last MRI was 11/04/2016 at [facility].           Willie Lucas hurt his back a coupe weeks ago when Willie Lucas bent over.   Pain is in the right back and into the right buttocks at time but not into the leg.   No numbness or weakness.   Willie Lucas saw his PCP and had a steroid pack.   MRI lumbar spine has show a disc protrusion at L5S1 towards the right  Willie Lucas is walking well and stays active -- exercise, weights, running daily.    No issues with balance, strnegth or fixed numbness (occ tingling).   Bladder is fine.    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  Oral steroids helped and Willie Lucas is now better.    We did several ESI's for lumbar radiculopathy.  Willie Lucas saw Dr. Rolena Infante in 2019 for L5-S1 disc protrusion but MRI in 2019 actually looked better.   Surgery was not recommended as Willie Lucas had improved by then.    Migraine headaches have done well.  Multiple sclerosis History: This woman was diagnosed with MS around 2000.  Initially Willie Lucas had several exacerbations with numbness and clumsiness and visual changes.[name] Willie Lucas was placed on Copaxone and did not have any further  exacerbations. About 5 or 6 years ago Willie Lucas went to every other day and year or 2 later Willie Lucas stopped the Copaxone. Willie Lucas has continued to do well with no definite exacerbations the last 5 years. An MRI was performed 09/08/2012.  It was compared to one dated 01/31/2008 andthere was only one new lesion in that 5 year interval.  There were no acute lesions on either study.  After his diagnosis Willie Lucas began to exercise on a very regular basis and continues to do so. Willie Lucas denies any significant sequela from the MS. Specifically, there is no difficulty with his gait and Willie Lucas notes no numbness or weakness. Willie Lucas does not have bladder or visual dysfunction. His mood issues with anxiety are probably unrelated to the MS. Willie Lucas does not note much for fatigue and remains active and travels frequently.   Willie Lucas denies any cognitive dysfunction and works as a Music therapist.  IMAGING: MRI brain 11/15/2019  Scattered T2/FLAIR hyperintense foci in the hemispheres in a pattern and configuration consistent with chronic demyelinating plaque associated with multiple sclerosis.  None of the foci enhances or appears to be acute.  Compared to the MRI dated 03/13/2015, there are no new lesions.      There is a normal enhancement pattern and no acute findings.  MRI cervical spine 11/15/2019 showed a normal spinal cord and  mild DJD at C2-C3, C5C6.  No nerve root cmpression  MRI of the lumbar spine 02/27/2018 showed a disc protrusion towards the left at L4-L5 and a disc protrusion towards the right at L5-S1.  The L5-S1 disc protrusion actually has an improved appearance compared to the 2011 MRI.  There is no definite nerve root compression and no spinal stenosis.  REVIEW OF SYSTEMS: Constitutional: No fevers, chills, sweats, or change in appetite Eyes: No visual changes, double vision, eye pain Ear, nose and throat: No hearing loss, ear pain, nasal congestion, sore throat Cardiovascular: No chest pain, palpitations Respiratory:  No shortness of breath at  rest or with exertion.   No wheezes GastrointestinaI: No nausea, vomiting, diarrhea, abdominal pain, fecal incontinence Genitourinary:  No dysuria, urinary retention or frequency.  No nocturia. Musculoskeletal:  See above  Integumentary: No rash, pruritus, skin lesions Neurological: as above Psychiatric: No depression at this time.  Notes anxiety Endocrine: No palpitations, diaphoresis, change in appetite, change in weigh or increased thirst Hematologic/Lymphatic:  No anemia, purpura, petechiae. Allergic/Immunologic: No itchy/runny eyes, nasal congestion, recent allergic reactions, rashes  ALLERGIES: No Known Allergies  HOME MEDICATIONS:  Current Outpatient Medications:    atorvastatin (LIPITOR) 10 MG tablet, Take 1 tablet (10 mg total) by mouth daily., Disp: 90 tablet, Rfl: 0   clonazePAM (KLONOPIN) 0.5 MG tablet, Take 1-2 tablets (0.5-1 mg total) by mouth 2 (two) times daily as needed (spasm, back)., Disp: 20 tablet, Rfl: 1   omeprazole (PRILOSEC) 40 MG capsule, TAKE 1 CAPSULE BY MOUTH EVERY DAY.NEED APPOINTMENT FOR MORE REFILLS!, Disp: 30 capsule, Rfl: 0   predniSONE (DELTASONE) 20 MG tablet, 3 by mouth for 3 days, then 2 by mouth for 2 days, then 1 by mouth for 2 days, then 1/2 by mouth for 2 days., Disp: 16 tablet, Rfl: 0   rizatriptan (MAXALT) 5 MG tablet, TAKE ONE TABLET BY MOUTH AS NEEDED FOR HEADACHE.MAY REPEAT IN 2 HOURS IF NEEDED, Disp: 10 tablet, Rfl: 2 No current facility-administered medications for this visit.  Facility-Administered Medications Ordered in Other Visits:    gadopentetate dimeglumine (MAGNEVIST) injection 19 mL, 19 mL, Intravenous, Once PRN, Aaran Enberg, Nanine Means, MD  PAST MEDICAL HISTORY: Past Medical History:  Diagnosis Date   Anxiety    Benign fundic gland polyps of stomach    Headache(784.0)    MS (multiple sclerosis) ([place])    Neuromuscular disorder ([place])    Ulcer    hx of duodenal ulcer at age 22    PAST SURGICAL HISTORY: Past Surgical History:   Procedure Laterality Date   COLONOSCOPY     RHINOPLASTY     X2    FAMILY HISTORY: Family History  Problem Relation Age of Onset   Diabetes Mother    Arthritis Mother    Suicidality Father    Brain cancer Brother        died   Colon cancer Neg Hx    Stomach cancer Neg Hx     SOCIAL HISTORY:  Social History   Socioeconomic History   Marital status: Married    Spouse name: Not on file   Number of children: 2   Years of education: Not on file   Highest education level: Not on file  Occupational History   Occupation: Music therapist  Tobacco Use   Smoking status: Former    Pack years: 0.00    Types: Cigarettes    Quit date: 1998    Years since quitting: 24.5   Smokeless tobacco: Never  Vaping Use   Vaping Use: Never used  Substance and Sexual Activity   Alcohol use: Yes    Alcohol/week: 2.0 - 3.0 standard drinks    Types: 2 - 3 Glasses of wine per week    Comment: occ   Drug use: No   Sexual activity: Yes    Partners: Female  Other Topics Concern   Not on file  Social History Narrative   Not on file   Social Determinants of Health   Financial Resource Strain: Not on file  Food Insecurity: Not on file  Transportation Needs: Not on file  Physical Activity: Not on file  Stress: Not on file  Social Connections: Not on file  Intimate Partner Violence: Not on file     PHYSICAL EXAM  Vitals:   09/18/20 1459  BP: 121/81  Pulse: 95  SpO2: 97%  Weight: 194 lb 8 oz (88.2 kg)  Height: 5\' 10"  (1.778 m)    Body mass index is 27.91 kg/m.   General: The patient is well-developed and well-nourished and in mild acute distress  Skin:  Extremities are without rash or edema.  Musculoskeletal: Willie Lucas has mild tenderness over the lower right lumbar paraspinals.  No piriformis tenderness.  No trochanteric bursa tenderness.   Neurologic Exam  Mental status: The patient is alert and oriented x 3 at the time of the examination. The patient has apparent normal  recent and remote memory, with an apparently normal attention span and concentration ability.   Speech is normal.  Cranial nerves: Extraocular movements are full.  Facial strength is normal.  Hearing appears to be normal.    Motor: Muscle bulk, tone and strength was normal  Sensory: There were no Tinel signs.  Willie Lucas had intact sensation to touch temperature and vibration in the arms and legs.  Coordination: Cerebellar testing reveals good finger-nose-finger and heel-to-shin bilaterally.  Gait and station: Station is normal.   Gait is normal. Tandem gait is normal. Romberg is negative.   Reflexes: Deep tendon reflexes are symmetric and normal bilaterally.    [name]    DIAGNOSTIC DATA (LABS, IMAGING, TESTING) - I reviewed patient records, labs, notes, testing and imaging myself where available.  Lab Results  Component Value Date   WBC 6.2 02/27/2018   HGB 16.3 02/27/2018   HCT 48.0 02/27/2018   MCV 90.4 02/27/2018   PLT 151 02/27/2018      Component Value Date/Time   NA 141 09/09/2020 1000   K 4.6 09/09/2020 1000   CL 108 09/09/2020 1000   CO2 28 09/09/2020 1000   GLUCOSE 87 09/09/2020 1000   BUN 18 09/09/2020 1000   CREATININE 0.93 09/09/2020 1000   CALCIUM 8.7 09/09/2020 1000   PROT 5.9 (L) 09/09/2020 1000   ALBUMIN 4.2 09/09/2020 1000   AST 17 09/09/2020 1000   ALT 15 09/09/2020 1000   ALKPHOS 62 09/09/2020 1000   BILITOT 0.4 09/09/2020 1000   GFRNONAA >60 02/27/2018 1414   GFRAA >60 02/27/2018 1414   Lab Results  Component Value Date   CHOL 258 (H) 09/09/2020   HDL 41.10 09/09/2020   LDLCALC 131 (H) 04/24/2014   LDLDIRECT 147.0 09/09/2020   TRIG 253.0 (H) 09/09/2020   CHOLHDL 6 09/09/2020   No results found for: HGBA1C No results found for: VITAMINB12 Lab Results  Component Value Date   TSH 1.11 02/28/2013     ASSESSMENT AND PLAN  Multiple sclerosis (HCC)  Lumbar radicular pain  Numbness  Common migraine without intractability  Acute right-sided low  back pain without sciatica   1.   Willie Lucas has been off of disease modifying therapies since 2012.  MRIs have been stable.   2.  Trigger point injection with 80 mg Depo-Medrol and 3 cc Marcaine into the right L4-L5 and L5-S1 paraspinal muscles.  Willie Lucas tolerated the injections well and there were no complications.  Stay active and exercise as tolerated 3.    Continue Maxalt for migraines.  Willie Lucas will return to see me in 12 months or sooner if Willie Lucas has new or worsening neurologic symptoms.   Rusty Villella A. Felecia Shelling, MD, PhD 2/82/4175, 3:01 PM Certified in Neurology, Clinical Neurophysiology, Sleep Medicine, Pain Medicine and Neuroimaging  [facility] [address] [place], [place] [address] [phone]

## 2020-09-25 DIAGNOSIS — H43823 Vitreomacular adhesion, bilateral: Secondary | ICD-10-CM | POA: Diagnosis not present

## 2020-09-25 DIAGNOSIS — H33192 Other retinoschisis and retinal cysts, left eye: Secondary | ICD-10-CM | POA: Diagnosis not present

## 2020-09-25 DIAGNOSIS — G35 Multiple sclerosis: Secondary | ICD-10-CM | POA: Diagnosis not present

## 2020-10-21 ENCOUNTER — Other Ambulatory Visit: Payer: Self-pay

## 2020-10-21 ENCOUNTER — Ambulatory Visit (INDEPENDENT_AMBULATORY_CARE_PROVIDER_SITE_OTHER): Payer: BC Managed Care – PPO | Admitting: Family Medicine

## 2020-10-21 VITALS — BP 126/78 | HR 69 | Temp 98.2°F | Resp 16 | Ht 70.0 in | Wt 193.2 lb

## 2020-10-21 DIAGNOSIS — E785 Hyperlipidemia, unspecified: Secondary | ICD-10-CM

## 2020-10-21 DIAGNOSIS — S39012A Strain of muscle, fascia and tendon of lower back, initial encounter: Secondary | ICD-10-CM

## 2020-10-21 LAB — LIPID PANEL
Cholesterol: 210 mg/dL — ABNORMAL HIGH (ref 0–200)
HDL: 57.1 mg/dL (ref >39.00)
LDL Cholesterol: 126 mg/dL — ABNORMAL HIGH (ref 0–99)
NonHDL: 152.59
Total CHOL/HDL Ratio: 4
Triglycerides: 132 mg/dL (ref 0.0–149.0)
VLDL: 26.4 mg/dL (ref 0.0–40.0)

## 2020-10-21 LAB — COMPREHENSIVE METABOLIC PANEL
ALT: 16 U/L (ref 0–53)
AST: 18 U/L (ref 0–37)
Albumin: 4.5 g/dL (ref 3.5–5.2)
Alkaline Phosphatase: 79 U/L (ref 39–117)
BUN: 10 mg/dL (ref 6–23)
CO2: 28 mEq/L (ref 19–32)
Calcium: 9.2 mg/dL (ref 8.4–10.5)
Chloride: 99 mEq/L (ref 96–112)
Creatinine, Ser: 0.93 mg/dL (ref 0.40–1.50)
GFR: 93.64 mL/min (ref >60.00)
Glucose, Bld: 71 mg/dL (ref 70–99)
Potassium: 4 mEq/L (ref 3.5–5.1)
Sodium: 134 mEq/L — ABNORMAL LOW (ref 135–145)
Total Bilirubin: 0.7 mg/dL (ref 0.2–1.2)
Total Protein: 6.6 g/dL (ref 6.0–8.3)

## 2020-10-21 MED ORDER — ATORVASTATIN CALCIUM 10 MG PO TABS: 10.0000 mg | ORAL_TABLET | Freq: Every day | ORAL | 1 refills | Status: DC

## 2020-10-21 NOTE — Progress Notes (Signed)
Subjective:  Patient ID: Willie Lucas, male    DOB: 10-09-1965  Age: 55 y.o. MRN: [id number]  CC:  Chief Complaint  Patient presents with   Hyperlipidemia    Pt here to get recheck medication started statin and has been taking consistently since receiving, denies side effects or other concerns      HPI Willie Lucas presents for   Hyperlipidemia: Started on Lipitor 10 mg daily on June 20.  Borderline ASCVD risk score, but at a recent optometry visit they noted some occlusions on retinal veins and concern about underlying hypertension or hyperlipidemia, and no history of hypertension.  Does have family history of hyperlipidemia with mother and sister both on statins.  Taking Lipitor daily.no new myalgias, arthralgias. Fasting today.  Lab Results  Component Value Date   CHOL 258 (H) 09/09/2020   HDL 41.10 09/09/2020   LDLCALC 131 (H) 04/24/2014   LDLDIRECT 147.0 09/09/2020   TRIG 253.0 (H) 09/09/2020   CHOLHDL 6 09/09/2020   Lab Results  Component Value Date   ALT 15 09/09/2020   AST 17 09/09/2020   ALKPHOS 62 09/09/2020   BILITOT 0.4 09/09/2020   Back pain has improved after prednisone and klonopin. Flairs every 6 months. Has discussed plan with neurosurgery - continued plan of intermittent prednisone if needed.   History Patient Active Problem List   Diagnosis Date Noted   Acute right-sided low back pain without sciatica 09/18/2020   Acute diarrhea 12/06/2019   Generalized abdominal pain 12/06/2019   Irritable bowel syndrome with diarrhea 12/06/2019   Numbness 05/12/2018   Paresthesias 11/25/2016   Common migraine without intractability 07/04/2014   Exercise-induced reactive airway disease 04/24/2014   Pure hypercholesterolemia 03/08/2013   SCIATICA 07/16/2009   MUSCLE SPASM, LUMBOSACRAL REGION 07/16/2009   Lumbar radicular pain 07/16/2009   URI 04/30/2009   MULTIPLE SCLEROSIS 03/28/2009   ACUT PEPTC ULCR UNS SITE W/HEM W/O MENTION OBST 03/28/2009   HEADACHE  03/28/2009   Past Medical History:  Diagnosis Date   Anxiety    Benign fundic gland polyps of stomach    Headache(784.0)    MS (multiple sclerosis) (HCC)    Neuromuscular disorder (HCC)    Ulcer    hx of duodenal ulcer at age 68   Past Surgical History:  Procedure Laterality Date   COLONOSCOPY     RHINOPLASTY     X2   No Known Allergies Prior to Admission medications   Medication Sig Start Date End Date Taking? Authorizing Provider  atorvastatin (LIPITOR) 10 MG tablet Take 1 tablet (10 mg total) by mouth daily. 09/09/20  Yes Wendie Agreste, MD  clonazePAM (KLONOPIN) 0.5 MG tablet Take 1-2 tablets (0.5-1 mg total) by mouth 2 (two) times daily as needed (spasm, back). 09/09/20  Yes Wendie Agreste, MD  omeprazole (PRILOSEC) 40 MG capsule TAKE 1 CAPSULE BY MOUTH EVERY DAY.NEED APPOINTMENT FOR MORE REFILLS! 07/06/18  Yes Ladene Artist, MD  rizatriptan (MAXALT) 5 MG tablet TAKE ONE TABLET BY MOUTH AS NEEDED FOR HEADACHE.MAY REPEAT IN 2 HOURS IF NEEDED 06/10/20  Yes Sater, Nanine Means, MD   Social History   Socioeconomic History   Marital status: Married    Spouse name: Not on file   Number of children: 2   Years of education: Not on file   Highest education level: Not on file  Occupational History   Occupation: Music therapist  Tobacco Use   Smoking status: Former    Types: Cigarettes    Quit  date: 23    Years since quitting: 24.5   Smokeless tobacco: Never  Vaping Use   Vaping Use: Never used  Substance and Sexual Activity   Alcohol use: Yes    Alcohol/week: 2.0 - 3.0 standard drinks    Types: 2 - 3 Glasses of wine per week    Comment: occ   Drug use: No   Sexual activity: Yes    Partners: Female  Other Topics Concern   Not on file  Social History Narrative   Not on file   Social Determinants of Health   Financial Resource Strain: Not on file  Food Insecurity: Not on file  Transportation Needs: Not on file  Physical Activity: Not on file  Stress: Not  on file  Social Connections: Not on file  Intimate Partner Violence: Not on file    Review of Systems Per HPI.   Objective:   Vitals:   10/21/20 1347  BP: 126/78  Pulse: 69  Resp: 16  Temp: 98.2 F (36.8 C)  TempSrc: Temporal  SpO2: 96%  Weight: 193 lb 3.2 oz (87.6 kg)  Height: '5\' 10"'$  (1.778 m)     Physical Exam Constitutional:      General: He is not in acute distress.    Appearance: Normal appearance. He is well-developed.  HENT:     Head: Normocephalic and atraumatic.  Cardiovascular:     Rate and Rhythm: Normal rate.  Pulmonary:     Effort: Pulmonary effort is normal.  Neurological:     Mental Status: He is alert and oriented to person, place, and time.  Psychiatric:        Mood and Affect: Mood normal.     Assessment & Plan:  Willie Lucas is a 55 y.o. male . Hyperlipidemia, unspecified hyperlipidemia type  - tolerating current regimen. Continue same. Recheck in 6 months for physical.   Lumbar strain Resolved. Rtc precautions if recurs. May need intermittent prednisone Rx per neuro.   No orders of the defined types were placed in this encounter.  There are no Patient Instructions on file for this visit.    Signed,   Merri Ray, MD [place] 10/21/20 2:07 PM

## 2020-11-06 ENCOUNTER — Encounter: Payer: Self-pay | Admitting: Family Medicine

## 2020-11-06 DIAGNOSIS — E871 Hypo-osmolality and hyponatremia: Secondary | ICD-10-CM

## 2020-11-06 DIAGNOSIS — E785 Hyperlipidemia, unspecified: Secondary | ICD-10-CM

## 2020-11-12 MED ORDER — ATORVASTATIN CALCIUM 20 MG PO TABS: 20.0000 mg | ORAL_TABLET | Freq: Every day | ORAL | 1 refills | Status: DC

## 2020-12-25 ENCOUNTER — Ambulatory Visit (INDEPENDENT_AMBULATORY_CARE_PROVIDER_SITE_OTHER): Payer: BC Managed Care – PPO | Admitting: Family Medicine

## 2020-12-25 ENCOUNTER — Other Ambulatory Visit: Payer: Self-pay

## 2020-12-25 VITALS — BP 128/76 | HR 72 | Temp 98.1°F | Resp 15 | Ht 70.0 in | Wt 197.0 lb

## 2020-12-25 DIAGNOSIS — K6289 Other specified diseases of anus and rectum: Secondary | ICD-10-CM | POA: Diagnosis not present

## 2020-12-25 DIAGNOSIS — Z23 Encounter for immunization: Secondary | ICD-10-CM

## 2020-12-25 DIAGNOSIS — R001 Bradycardia, unspecified: Secondary | ICD-10-CM

## 2020-12-25 DIAGNOSIS — R55 Syncope and collapse: Secondary | ICD-10-CM | POA: Diagnosis not present

## 2020-12-25 LAB — CBC
HCT: 46.2 % (ref 39.0–52.0)
Hemoglobin: 15.3 g/dL (ref 13.0–17.0)
MCHC: 33.2 g/dL (ref 30.0–36.0)
MCV: 94.7 fl (ref 78.0–100.0)
Platelets: 129 10*3/uL — ABNORMAL LOW (ref 150.0–400.0)
RBC: 4.88 Mil/uL (ref 4.22–5.81)
RDW: 12.7 % (ref 11.5–15.5)
WBC: 4.8 10*3/uL (ref 4.0–10.5)

## 2020-12-25 LAB — BASIC METABOLIC PANEL
BUN: 14 mg/dL (ref 6–23)
CO2: 30 mEq/L (ref 19–32)
Calcium: 8.9 mg/dL (ref 8.4–10.5)
Chloride: 103 mEq/L (ref 96–112)
Creatinine, Ser: 0.99 mg/dL (ref 0.40–1.50)
GFR: 86.76 mL/min (ref >60.00)
Glucose, Bld: 89 mg/dL (ref 70–99)
Potassium: 4.3 mEq/L (ref 3.5–5.1)
Sodium: 138 mEq/L (ref 135–145)

## 2020-12-25 NOTE — Patient Instructions (Addendum)
Previous syncope/passing out episodes likely were vagal reactions from pain.  That could be related to the rectal pain or hemorrhoids, so I would like you to contact your gastroenterologist and discuss those symptoms.  If there are concerns on your blood work I will let you know. I will also refer you to cardiology as we discussed.  If any return of symptoms, follow-up with me, urgent care or ER if needed.  Return to the clinic or go to the nearest emergency room if any of your symptoms worsen or new symptoms occur.  +- Syncope Syncope refers to a condition in which a person temporarily loses consciousness. Syncope may also be called fainting or passing out. It is caused by a sudden decrease in blood flow to the brain. Even though most causes of syncope are not dangerous, syncope can be a sign of a serious medical problem. Your health care provider may do tests to find the reason why you are having syncope. Signs that you may be about to faint include: Feeling dizzy or light-headed. Feeling nauseous. Seeing all white or all black in your field of vision. Having cold, clammy skin. If you faint, get medical help right away. Call your local emergency services (911 in the [place] Do not drive yourself to the hospital. Follow these instructions at home: Pay attention to any changes in your symptoms. Take these actions to stay safe and to help relieve your symptoms: Lifestyle Do not drive, use machinery, or play sports until your health care provider says it is okay. Do not drink alcohol. Do not use any products that contain nicotine or tobacco, such as cigarettes and e-cigarettes. If you need help quitting, ask your health care provider. Drink enough fluid to keep your urine pale yellow. General instructions Take over-the-counter and prescription medicines only as told by your health care provider. If you are taking blood pressure or heart medicine, get up slowly and take several minutes to sit and  then stand. This can reduce dizziness or light-headedness. Have someone stay with you until you feel stable. If you start to feel like you might faint, lie down right away and raise (elevate) your feet above the level of your heart. Breathe deeply and steadily. Wait until all the symptoms have passed. Keep all follow-up visits as told by your health care provider. This is important. Get help right away if you: Have a severe headache. Faint once or repeatedly. Have pain in your chest, abdomen, or back. Have a very fast or irregular heartbeat (palpitations). Have pain when you breathe. Are bleeding from your mouth or rectum, or you have black or tarry stool. Have a seizure. Are confused. Have trouble walking. Have severe weakness. Have vision problems. These symptoms may represent a serious problem that is an emergency. Do not wait to see if your symptoms will go away. Get medical help right away. Call your local emergency services (911 in the [place] Do not drive yourself to the hospital. Summary Syncope refers to a condition in which a person temporarily loses consciousness. It is caused by a sudden decrease in blood flow to the brain. Signs that you may be about to faint include dizziness, feeling light-headed, feeling nauseous, sudden vision changes, or cold, clammy skin. Although most causes of syncope are not dangerous, syncope can be a sign of a serious medical problem. If you faint, get medical help right away. This information is not intended to replace advice given to you by your health care provider. Make sure  you discuss any questions you have with your health care provider. Document Revised: 06/20/2019 Document Reviewed: 07/20/2019 Elsevier Patient Education  [place].

## 2020-12-25 NOTE — Progress Notes (Signed)
Subjective:  Patient ID: Willie Lucas, male    DOB: 1965-07-29  Age: 55 y.o. MRN: [id number]  CC:  Chief Complaint  Patient presents with   Loss of Consciousness    Pt reported a "pressure" in his lower abdomen, felt severe pain dizzy feeling and passed out, once on an airplane, first incident on the boat was a year ago, pt denies any since the airplane 2 weeks ago, history of hemorrhoids with use of steroid suppositories    Immunizations    Pt willing to get 2nd shingles and flu shot today unless advised otherwise     HPI Willie Lucas presents for   Syncope most recently when he was on an airplane 2 weeks ago. Sitting in chair, pressure in rectal area, adjusting position, increased pressure to pain. Briefly passed out for few seconds, no injury. no seizure activity, similar as episode a year ago.cold sweat afterward. Was having a hemorrhoid flare, no rectal bleeding.   Hx of hemorrhoids, treated with steroid suppositories.  Treated by [facility] in April, Dr. Fuller Plan - follow up as needed. IBS, hemorrhoid.   Initial symptoms 1 year ago - on Honeymoon - snorkeling, hot outside. No alcohol. Hydrated, wearing hat. Felt pressure in lower abdomen/rectal area.  Pressure turned into pain, attempted to stand to get comfortable - LOC, woke up on side of boat. Witnessed, no seizure activity, no incontinence, no tongue lac.  Saw resort doctor, told may have been heat related. Did ok rest of the trip.  Hx of MS - discussed prior episode with neuro, did not think related to his MS.   No CP, palpitations. Only preceding rectal pain.   History Patient Active Problem List   Diagnosis Date Noted   Acute right-sided low back pain without sciatica 09/18/2020   Acute diarrhea 12/06/2019   Generalized abdominal pain 12/06/2019   Irritable bowel syndrome with diarrhea 12/06/2019   Numbness 05/12/2018   Paresthesias 11/25/2016   Common migraine without intractability 07/04/2014    Exercise-induced reactive airway disease 04/24/2014   Pure hypercholesterolemia 03/08/2013   SCIATICA 07/16/2009   MUSCLE SPASM, LUMBOSACRAL REGION 07/16/2009   Lumbar radicular pain 07/16/2009   URI 04/30/2009   MULTIPLE SCLEROSIS 03/28/2009   ACUT PEPTC ULCR UNS SITE W/HEM W/O MENTION OBST 03/28/2009   HEADACHE 03/28/2009   Past Medical History:  Diagnosis Date   Anxiety    Benign fundic gland polyps of stomach    Headache(784.0)    MS (multiple sclerosis) (HCC)    Neuromuscular disorder (HCC)    Ulcer    hx of duodenal ulcer at age 25   Past Surgical History:  Procedure Laterality Date   COLONOSCOPY     RHINOPLASTY     X2   No Known Allergies Prior to Admission medications   Medication Sig Start Date End Date Taking? Authorizing Provider  atorvastatin (LIPITOR) 20 MG tablet Take 1 tablet (20 mg total) by mouth daily. 11/12/20  Yes Wendie Agreste, MD  clonazePAM (KLONOPIN) 0.5 MG tablet Take 1-2 tablets (0.5-1 mg total) by mouth 2 (two) times daily as needed (spasm, back). 09/09/20  Yes Wendie Agreste, MD  omeprazole (PRILOSEC) 40 MG capsule TAKE 1 CAPSULE BY MOUTH EVERY DAY.NEED APPOINTMENT FOR MORE REFILLS! 07/06/18  Yes Ladene Artist, MD  rizatriptan (MAXALT) 5 MG tablet TAKE ONE TABLET BY MOUTH AS NEEDED FOR HEADACHE.MAY REPEAT IN 2 HOURS IF NEEDED 06/10/20  Yes Sater, Nanine Means, MD   Social History  Socioeconomic History   Marital status: Married    Spouse name: Not on file   Number of children: 2   Years of education: Not on file   Highest education level: Not on file  Occupational History   Occupation: Music therapist  Tobacco Use   Smoking status: Former    Types: Cigarettes    Quit date: 1998    Years since quitting: 24.7   Smokeless tobacco: Never  Vaping Use   Vaping Use: Never used  Substance and Sexual Activity   Alcohol use: Yes    Alcohol/week: 2.0 - 3.0 standard drinks    Types: 2 - 3 Glasses of wine per week    Comment: occ   Drug  use: No   Sexual activity: Yes    Partners: Female  Other Topics Concern   Not on file  Social History Narrative   Not on file   Social Determinants of Health   Financial Resource Strain: Not on file  Food Insecurity: Not on file  Transportation Needs: Not on file  Physical Activity: Not on file  Stress: Not on file  Social Connections: Not on file  Intimate Partner Violence: Not on file    Review of Systems Per HPI  Objective:   Vitals:   12/25/20 0836  BP: 128/76  Pulse: 72  Resp: 15  Temp: 98.1 F (36.7 C)  TempSrc: Temporal  SpO2: 98%  Weight: 197 lb (89.4 kg)  Height: 5\' 10"  (1.778 m)     Physical Exam Vitals reviewed.  Constitutional:      General: He is not in acute distress.    Appearance: Normal appearance. He is well-developed. He is not ill-appearing, toxic-appearing or diaphoretic.  HENT:     Head: Normocephalic and atraumatic.  Neck:     Vascular: No carotid bruit or JVD.  Cardiovascular:     Rate and Rhythm: Normal rate and regular rhythm.     Heart sounds: Normal heart sounds. No murmur heard.   No friction rub. No gallop.  Pulmonary:     Effort: Pulmonary effort is normal.     Breath sounds: Normal breath sounds. No rales.  Abdominal:     General: Abdomen is flat. There is no distension.     Palpations: Abdomen is soft. There is no mass.     Tenderness: There is no abdominal tenderness. There is no guarding.  Musculoskeletal:     Right lower leg: No edema.     Left lower leg: No edema.  Skin:    General: Skin is warm and dry.  Neurological:     General: No focal deficit present.     Mental Status: He is alert and oriented to person, place, and time.     Motor: No weakness.  Psychiatric:        Mood and Affect: Mood normal.    EKG: Sinus bradycardia, rate 55.  Left axis, LAFB, no significant changes from 03/08/2013, also bradycardia at that time  rate 57, left axis with anterior fascicular block.   Assessment & Plan:  Willie Lucas is a 55 y.o. male . Syncope, unspecified syncope type - Plan: Basic metabolic panel, CBC, EKG 62-ZHYQ  Need for influenza vaccination - Plan: Flu Vaccine QUAD 6+ mos PF IM (Fluarix Quad PF)  Need for shingles vaccine - Plan: Varicella-zoster vaccine IM  Rectal pain  Vaso vagal episode - Plan: Basic metabolic panel, CBC, EKG 65-HQIO  Syncopal episodes over the past 1 year, both proceeding with  rectal pain.  Possible vasovagal reaction due to pain, and underlying hemorrhoids.  Denies seizure activity or any new neurologic symptoms, has discussed with his neurologist and likely not related to [place].  No cardiac symptoms.  Does have underlying sinus bradycardia with left axis and fascicular block.  - Initially we will have him follow-up with gastroenterology to discuss rectal pain, RTC/ER precautions given if recurrence of symptoms.  Would consider discussing with neurology if any return of symptoms.  Check CBC, BMP.   -Also discussed meeting with cardiology given underlying bradycardia, fascicular block - referral placed.   Flu vaccine, shingles vaccine given.  No orders of the defined types were placed in this encounter.  Patient Instructions  Previous syncope/passing out episodes likely were vagal reactions from pain.  That could be related to the rectal pain or hemorrhoids, so I would like you to contact your gastroenterologist and discuss those symptoms.  If there are concerns on your blood work I will let you know.  If any return of symptoms, follow-up with me, urgent care or ER if needed.  Return to the clinic or go to the nearest emergency room if any of your symptoms worsen or new symptoms occur. Syncope Syncope refers to a condition in which a person temporarily loses consciousness. Syncope may also be called fainting or passing out. It is caused by a sudden decrease in blood flow to the brain. Even though most causes of syncope are not dangerous, syncope can be a sign of a serious  medical problem. Your health care provider may do tests to find the reason why you are having syncope. Signs that you may be about to faint include: Feeling dizzy or light-headed. Feeling nauseous. Seeing all white or all black in your field of vision. Having cold, clammy skin. If you faint, get medical help right away. Call your local emergency services (911 in the [place] Do not drive yourself to the hospital. Follow these instructions at home: Pay attention to any changes in your symptoms. Take these actions to stay safe and to help relieve your symptoms: Lifestyle Do not drive, use machinery, or play sports until your health care provider says it is okay. Do not drink alcohol. Do not use any products that contain nicotine or tobacco, such as cigarettes and e-cigarettes. If you need help quitting, ask your health care provider. Drink enough fluid to keep your urine pale yellow. General instructions Take over-the-counter and prescription medicines only as told by your health care provider. If you are taking blood pressure or heart medicine, get up slowly and take several minutes to sit and then stand. This can reduce dizziness or light-headedness. Have someone stay with you until you feel stable. If you start to feel like you might faint, lie down right away and raise (elevate) your feet above the level of your heart. Breathe deeply and steadily. Wait until all the symptoms have passed. Keep all follow-up visits as told by your health care provider. This is important. Get help right away if you: Have a severe headache. Faint once or repeatedly. Have pain in your chest, abdomen, or back. Have a very fast or irregular heartbeat (palpitations). Have pain when you breathe. Are bleeding from your mouth or rectum, or you have black or tarry stool. Have a seizure. Are confused. Have trouble walking. Have severe weakness. Have vision problems. These symptoms may represent a serious problem  that is an emergency. Do not wait to see if your symptoms will go away. Get  medical help right away. Call your local emergency services (911 in the [place] Do not drive yourself to the hospital. Summary Syncope refers to a condition in which a person temporarily loses consciousness. It is caused by a sudden decrease in blood flow to the brain. Signs that you may be about to faint include dizziness, feeling light-headed, feeling nauseous, sudden vision changes, or cold, clammy skin. Although most causes of syncope are not dangerous, syncope can be a sign of a serious medical problem. If you faint, get medical help right away. This information is not intended to replace advice given to you by your health care provider. Make sure you discuss any questions you have with your health care provider. Document Revised: 06/20/2019 Document Reviewed: 07/20/2019 Elsevier Patient Education  2022 [place],   Merri Ray, MD [facility] 12/25/20 9:23 AM

## 2021-01-20 ENCOUNTER — Other Ambulatory Visit: Payer: Self-pay

## 2021-01-20 ENCOUNTER — Encounter: Payer: Self-pay | Admitting: Cardiology

## 2021-01-20 ENCOUNTER — Ambulatory Visit: Payer: BC Managed Care – PPO | Admitting: Cardiology

## 2021-01-20 VITALS — Temp 98.0°F | Resp 16 | Ht 70.0 in | Wt 198.0 lb

## 2021-01-20 DIAGNOSIS — R55 Syncope and collapse: Secondary | ICD-10-CM | POA: Diagnosis not present

## 2021-01-20 DIAGNOSIS — R001 Bradycardia, unspecified: Secondary | ICD-10-CM | POA: Diagnosis not present

## 2021-01-20 NOTE — Progress Notes (Signed)
Patient referred by Wendie Agreste, MD for syncope  Subjective:   Willie Lucas, male    DOB: Mar 03, 1966, 55 y.o.   MRN: [id number]   Chief Complaint  Patient presents with   Bradycardia   New Patient (Initial Visit)   Dizziness     HPI  55 y.o. Caucasian male with h/o multiple sclerosis, referred for syncope  Patient works as a Music therapist.  He is very active, works out in a boot camp at least once a week.  This includes a 90-minute workout of intense aerobic activity, as well as weight training.  In addition, he performs other low intensity exercises, such as walking, on other days.  Patient has had 2 episodes of syncope a year apart.  Both episodes were preceded by intense rectal pain, followed by lightheadedness, syncope, with quick recovery and a few minutes without any loss of bowel or bladder tone or confusion.  First episode occurred while on a boat in the [place].  Second episode occurred while on a flight.  Patient denies any prior syncopal episodes.  He denies any chest pain, shortness of breath, either before the syncopal episodes, or during any physical exertion.  Patient uses an apple watch regularly.  On a separate note, patient has history of multiple sclerosis.  At one point, he was on therapy for the same.  His worst symptoms have included paraplegia.  He has been off medications for last 10 years without any severe symptoms.  He has had occasional tingling numbness from time to time.  He is regularly followed by neurologist Dr. Felecia Shelling.  He denies any melena, hematochezia symptoms.  On review, it appears that he has had history of fundic gland polyps of stomach.  He denies any other known GI diagnoses.   Past Medical History:  Diagnosis Date   Anxiety    Benign fundic gland polyps of stomach    Headache(784.0)    Hyperlipidemia    Hypertension    MS (multiple sclerosis) (HCC)    Neuromuscular disorder (HCC)    Ulcer    hx of duodenal ulcer at age  55     Past Surgical History:  Procedure Laterality Date   COLONOSCOPY     RHINOPLASTY     X2     Social History   Tobacco Use  Smoking Status Former   Packs/day: 0.25   Years: 10.00   Pack years: 2.50   Types: Cigarettes   Quit date: 1998   Years since quitting: 24.8  Smokeless Tobacco Never    Social History   Substance and Sexual Activity  Alcohol Use Not Currently   Comment: occ     Family History  Problem Relation Age of Onset   Diabetes Mother    Arthritis Mother    Suicidality Father    Brain cancer Brother        died   Colon cancer Neg Hx    Stomach cancer Neg Hx      Current Outpatient Medications on File Prior to Visit  Medication Sig Dispense Refill   atorvastatin (LIPITOR) 20 MG tablet Take 1 tablet (20 mg total) by mouth daily. 90 tablet 1   clonazePAM (KLONOPIN) 0.5 MG tablet Take 1-2 tablets (0.5-1 mg total) by mouth 2 (two) times daily as needed (spasm, back). 20 tablet 1   hydrOXYzine (ATARAX/VISTARIL) 25 MG tablet 1 tablet as needed     omeprazole (PRILOSEC) 40 MG capsule TAKE 1 CAPSULE BY MOUTH EVERY DAY.NEED APPOINTMENT  FOR MORE REFILLS! 30 capsule 0   rizatriptan (MAXALT) 5 MG tablet TAKE ONE TABLET BY MOUTH AS NEEDED FOR HEADACHE.MAY REPEAT IN 2 HOURS IF NEEDED 10 tablet 2   Current Facility-Administered Medications on File Prior to Visit  Medication Dose Route Frequency Provider Last Rate Last Admin   gadopentetate dimeglumine (MAGNEVIST) injection 19 mL  19 mL Intravenous Once PRN Sater, Nanine Means, MD        Cardiovascular and other pertinent studies:  EKG 01/20/2021: Sinus rhythm 53 bpm Left axis -anterior fascicular block  No results found for this or any previous visit from the past 1095 days.     Recent labs: 12/25/2020: Glucose 89, BUN/Cr 14/0.99. EGFR 86. Na/K 138/4.3. Rest of the CMP normal H/H 15/46. MCV 94. Platelets 129 HbA1C N/A Chol 210, TG 132, HDL 57, LDL 126 TSH N/A    Review of Systems   Cardiovascular:  Positive for syncope. Negative for chest pain, dyspnea on exertion, leg swelling and palpitations.        Vitals:   01/20/21 1331  Resp: 16  Temp: 98 F (36.7 C)  SpO2: 99%   Orthostatic VS for the past 72 hrs (Last 3 readings):  Orthostatic BP Patient Position BP Location Cuff Size Orthostatic Pulse  01/20/21 1340 117/84 Standing Left Arm Normal 61  01/20/21 1339 128/82 Sitting Left Arm Normal 58  01/20/21 1331 122/79 Supine Left Arm Normal 53     Body mass index is 28.41 kg/m. Filed Weights   01/20/21 1331  Weight: 198 lb (89.8 kg)     Objective:   Physical Exam Vitals and nursing note reviewed.  Constitutional:      General: He is not in acute distress. Neck:     Vascular: No JVD.  Cardiovascular:     Rate and Rhythm: Normal rate and regular rhythm.     Pulses: Normal pulses.     Heart sounds: Normal heart sounds. No murmur heard. Pulmonary:     Effort: Pulmonary effort is normal.     Breath sounds: Normal breath sounds. No wheezing or rales.  Musculoskeletal:     Right lower leg: No edema.     Left lower leg: No edema.         Assessment & Recommendations:   55 y.o. Caucasian male with h/o multiple sclerosis, referred for syncope  Syncope: Classical vasovagal syncope, triggered by intense rectal pain.  I remain unclear about the etiology of this pain.  However, syncope is almost certainly triggered by this.  We discussed counterpressure maneuvers to avoid full with syncope.  Resting bradycardia is consistent with patient's excellent physical conditioning.  Orthostatics are negative.   I will obtain echocardiogram to rule out any structural cardiac abnormality.  I encouraged the patient to continue wearing apple watch regularly.  Should he notice any abnormal heart rhythms on apple watch, I will further perform cardiac telemetry.  Further recommendations after above testing.  Thank you for referring the patient to [place]. Please feel free  to contact with any questions.   Nigel Mormon, MD Pager: [phone] Office: [phone]

## 2021-01-21 NOTE — Telephone Encounter (Signed)
From pt

## 2021-02-06 ENCOUNTER — Encounter: Payer: Self-pay | Admitting: Gastroenterology

## 2021-02-06 ENCOUNTER — Ambulatory Visit (INDEPENDENT_AMBULATORY_CARE_PROVIDER_SITE_OTHER): Payer: BC Managed Care – PPO | Admitting: Gastroenterology

## 2021-02-06 VITALS — BP 102/76 | HR 70 | Ht 70.0 in | Wt 194.1 lb

## 2021-02-06 DIAGNOSIS — K648 Other hemorrhoids: Secondary | ICD-10-CM

## 2021-02-06 DIAGNOSIS — K594 Anal spasm: Secondary | ICD-10-CM

## 2021-02-06 DIAGNOSIS — Z86010 Personal history of colonic polyps: Secondary | ICD-10-CM

## 2021-02-06 MED ORDER — NA SULFATE-K SULFATE-MG SULF 17.5-3.13-1.6 GM/177ML PO SOLN: 1.0000 | Freq: Once | ORAL | 0 refills | Status: AC

## 2021-02-06 NOTE — Progress Notes (Addendum)
    History of Present Illness: This is a 55 year old male with 2 episodes of severe buttock and rectal pain resulting in syncope.  He has history of IBS, MS, GERD, adenomatous colon polyps, internal hemorrhoids.  He relates 2 episodes of brief severe buttock and rectal pain that increased in severity to the point where he passed out.  The pain lasted for 1 to 2 minutes.  No other associated gastrointestinal complaints.  He has ongoing difficulties with perianal discomfort and itching that responds to topical hemorrhoidal medications and returns when he discontinues medications.  He was evaluated by Dr. Johney Maine, Brenda, on January 22, 2021 for small, 3 mm, anal lesion felt to be a skin tag or polyp and grade 2 internal hemorrhoids.  He underwent a anoscopy.  Follow-up was recommended for 3 months.  He was evaluated by cardiology in October who felt his syncope was classic vasovagal triggered by rectal pain.  Current Medications, Allergies, Past Medical History, Past Surgical History, Family History and Social History were reviewed in [employer] record.   Physical Exam: General: Well developed, well nourished, no acute distress Head: Normocephalic and atraumatic Eyes: Sclerae anicteric, EOMI Ears: Normal auditory acuity Mouth: Not examined, mask on during Covid-19 pandemic Lungs: Clear throughout to auscultation Heart: Regular rate and rhythm; no murmurs, rubs or bruits Abdomen: Soft, non tender and non distended. No masses, hepatosplenomegaly or hernias noted. Normal Bowel sounds Rectal: Deferred to colonoscopy. See recent anoscopy findings in HPI Musculoskeletal: Symmetrical with no gross deformities  Pulses:  Normal pulses noted Extremities: No clubbing, cyanosis, edema or deformities noted Neurological: Alert oriented x 4, grossly nonfocal Psychological:  Alert and cooperative. Normal mood and affect   Assessment and Recommendations:  Proctalgia fugax.  This disorder  was discussed and questions answered.  Unfortunately the severity of his pain has led to 2 episodes of vasovagal syncope. Grade 2 internal hemorrhoids. Small anal lesion felt to be a benign polyp or skin tag.  Follow-up standard rectal care instructions.  Continue Anusol HC suppositories PR daily as needed.  Trial of RectiCare 3 times daily as needed.  He is frustrated with his persistent hemorrhoidal symptoms and asks about additional treatment options.  We briefly discussed hemorrhoidal banding and hemorrhoidectomy.  Follow-up with Dr. Johney Maine, CCS, as recommended. Personal history of adenomatous colon polyps.  Given problems #1 and 2 and his history of colon polyps we decided to proceed with colonoscopy at this time for further evaluation.  Schedule colonoscopy. The risks (including bleeding, perforation, infection, missed lesions, medication reactions and possible hospitalization or surgery if complications occur), benefits, and alternatives to colonoscopy with possible biopsy and possible polypectomy were discussed with the patient and they consent to proceed.

## 2021-02-06 NOTE — Patient Instructions (Signed)
RECTAL CARE INSTRUCTIONS:  1. Sitz Baths twice a day for 10 minutes each. 2. Thoroughly clean and dry the rectum. 3. Put Tucks pad against the rectum at night. 4. Clean the rectum with Balenol lotion after each bowel movement.  Start over the counter Recticare three times a day as needed.  You have been scheduled for a colonoscopy. Please follow written instructions given to you at your visit today.  Please pick up your prep supplies at the pharmacy within the next 1-3 days. If you use inhalers (even only as needed), please bring them with you on the day of your procedure.  Due to recent changes in healthcare laws, you may see the results of your imaging and laboratory studies on MyChart before your provider has had a chance to review them.  We understand that in some cases there may be results that are confusing or concerning to you. Not all laboratory results come back in the same time frame and the provider may be waiting for multiple results in order to interpret others.  Please give [place] 48 hours in order for your provider to thoroughly review all the results before contacting the office for clarification of your results.   The [facility] providers would like to encourage you to use [facility] to communicate with providers for non-urgent requests or questions.  Due to long hold times on the telephone, sending your provider a message by [facility] may be a faster and more efficient way to get a response.  Please allow 48 business hours for a response.  Please remember that this is for non-urgent requests.   Thank you for choosing me and [facility].  Pricilla Riffle. Dagoberto Ligas., MD., [facility] Regal

## 2021-02-10 ENCOUNTER — Other Ambulatory Visit: Payer: Self-pay | Admitting: Neurology

## 2021-02-12 ENCOUNTER — Telehealth: Payer: Self-pay | Admitting: Neurology

## 2021-02-12 MED ORDER — METHYLPREDNISOLONE 4 MG PO TBPK: ORAL_TABLET | ORAL | 0 refills | Status: DC

## 2021-02-12 NOTE — Telephone Encounter (Addendum)
Called pt. Last seen 08/2020. Sx w/ his normal neuropathy in extremities. Not uncommon for him to have intermittently. Normally goes away on its own. However, sx still present over the last week. Hands/toes falling asleep more often and more severe.  Wanting order steroids called in.  He is off DMT for MS. Advised I will speak w/ MD. If approves, we will send in. He would like mychart message sent confirming plan.  Spoke w/ MD. He approved calling in oral steroids. This was sent in to request pharmacy. I sent mychart to pt.

## 2021-02-12 NOTE — Telephone Encounter (Signed)
Pt called, MS episode started a week ago, symptoms have gotten worse. Out of town in [place]  Need a steroid sent to: [facility] [address]  [place], [place] [address]  Phone: [phone]  Would like a call from the nurse.

## 2021-02-20 DIAGNOSIS — M25531 Pain in right wrist: Secondary | ICD-10-CM | POA: Diagnosis not present

## 2021-02-24 ENCOUNTER — Other Ambulatory Visit: Payer: BC Managed Care – PPO

## 2021-03-04 ENCOUNTER — Ambulatory Visit: Payer: BC Managed Care – PPO

## 2021-03-04 ENCOUNTER — Other Ambulatory Visit: Payer: Self-pay

## 2021-03-04 DIAGNOSIS — R55 Syncope and collapse: Secondary | ICD-10-CM

## 2021-03-05 ENCOUNTER — Telehealth: Payer: Self-pay | Admitting: Gastroenterology

## 2021-03-05 MED ORDER — PANTOPRAZOLE SODIUM 40 MG PO TBEC: 40.0000 mg | DELAYED_RELEASE_TABLET | Freq: Two times a day (BID) | ORAL | 11 refills | Status: DC

## 2021-03-05 NOTE — Telephone Encounter (Signed)
Spoke with pt and he is aware of Dr. Lynne Leader recommendations. Script sent to pharmacy. Pt knows to contact the office if he does not improve on the protonix.

## 2021-03-05 NOTE — Telephone Encounter (Signed)
Pt called and states he has been having issues with reflux for several weeks, has had epigastric pain. He reports he got some otc prilosec and started taking it in the am but that was not taking care of the issue so he is now taking it BID. He reports it is still not under control. Pt wonders if he needs an EGD in addition to his scheduled colonoscopy or if Dr. Fuller Plan may prescribe something for the reflux he is having. Please advise.

## 2021-03-05 NOTE — Telephone Encounter (Signed)
Change to pantoprazole 40 mg po bid, 1 year of refills. Closely follow antireflux measures. His EGD in 2018 did not show esophagitis so would not repeat EGD unless his symptoms are not controlled after 3-4 week on pantoprazole 40 mg po bid.  There are no procedure slots for adding on an EGD on 1/9 with his colonoscopy so if EGD needed it would need to be done at another time unless he wants to cancel his colonoscopy and reschedule at a later date to determine if EGD is necessary.

## 2021-03-05 NOTE — Telephone Encounter (Signed)
Patient called states he is having issues with Jerrye Bushy and he is wanting to get a double procedure. I advise him he would need to get approval from the provider and reschedule. He requested a call back.

## 2021-03-10 DIAGNOSIS — M25531 Pain in right wrist: Secondary | ICD-10-CM | POA: Diagnosis not present

## 2021-03-25 DIAGNOSIS — M25531 Pain in right wrist: Secondary | ICD-10-CM | POA: Insufficient documentation

## 2021-03-31 ENCOUNTER — Encounter: Payer: Self-pay | Admitting: Gastroenterology

## 2021-03-31 ENCOUNTER — Ambulatory Visit (AMBULATORY_SURGERY_CENTER): Payer: BC Managed Care – PPO | Admitting: Gastroenterology

## 2021-03-31 VITALS — BP 119/76 | HR 55 | Temp 98.4°F | Resp 13 | Ht 70.0 in | Wt 194.0 lb

## 2021-03-31 DIAGNOSIS — Z86010 Personal history of colonic polyps: Secondary | ICD-10-CM

## 2021-03-31 DIAGNOSIS — K621 Rectal polyp: Secondary | ICD-10-CM | POA: Diagnosis not present

## 2021-03-31 DIAGNOSIS — D128 Benign neoplasm of rectum: Secondary | ICD-10-CM | POA: Diagnosis not present

## 2021-03-31 DIAGNOSIS — Z1211 Encounter for screening for malignant neoplasm of colon: Secondary | ICD-10-CM | POA: Diagnosis not present

## 2021-03-31 DIAGNOSIS — D123 Benign neoplasm of transverse colon: Secondary | ICD-10-CM

## 2021-03-31 DIAGNOSIS — R1084 Generalized abdominal pain: Secondary | ICD-10-CM

## 2021-03-31 DIAGNOSIS — K580 Irritable bowel syndrome with diarrhea: Secondary | ICD-10-CM

## 2021-03-31 MED ORDER — SODIUM CHLORIDE 0.9 % IV SOLN: 500.0000 mL | INTRAVENOUS | Status: DC

## 2021-03-31 NOTE — Op Note (Signed)
[facility] Patient Name: Willie Lucas Procedure Date: 03/31/2021 1:27 PM MRN: [id number] Endoscopist: Ladene Artist , MD Age: 56 Referring MD:  Date of Birth: 06/09/1965 Gender: Male Account #: [id number] Procedure:                Colonoscopy Indications:              Surveillance: Personal history of adenomatous                            polyps on last colonoscopy 5 years ago Medicines:                Monitored Anesthesia Care Procedure:                Pre-Anesthesia Assessment:                           - Prior to the procedure, a History and Physical                            was performed, and patient medications and                            allergies were reviewed. The patient's tolerance of                            previous anesthesia was also reviewed. The risks                            and benefits of the procedure and the sedation                            options and risks were discussed with the patient.                            All questions were answered, and informed consent                            was obtained. Prior Anticoagulants: The patient has                            taken no previous anticoagulant or antiplatelet                            agents. ASA Grade Assessment: II - A patient with                            mild systemic disease. After reviewing the risks                            and benefits, the patient was deemed in                            satisfactory condition to undergo the procedure.  After obtaining informed consent, the colonoscope                            was passed under direct vision. Throughout the                            procedure, the patient's blood pressure, pulse, and                            oxygen saturations were monitored continuously. The                            Olympus Colonoscope #[id number] was introduced through                            the anus and advanced to  the the cecum, identified                            by appendiceal orifice and ileocecal valve. The                            ileocecal valve, appendiceal orifice, and rectum                            were photographed. The quality of the bowel                            preparation was good. The colonoscopy was performed                            without difficulty. The patient tolerated the                            procedure well. Scope In: 1:30:47 PM Scope Out: 1:48:48 PM Scope Withdrawal Time: 0 hours 15 minutes 40 seconds  Total Procedure Duration: 0 hours 18 minutes 1 second  Findings:                 The perianal and digital rectal examinations were                            normal.                           A 4 mm polyp was found in the transverse colon. The                            polyp was sessile. The polyp was removed with a                            cold biopsy forceps. Resection and retrieval were                            complete.  A 6 mm polyp was found in the rectum. The polyp was                            sessile. The polyp was removed with a cold snare.                            Resection and retrieval were complete.                           The terminal ileum appeared normal.                           Internal hemorrhoids were found during                            retroflexion. The hemorrhoids were moderate and                            Grade II (internal hemorrhoids that prolapse but                            reduce spontaneously).                           The exam was otherwise without abnormality on                            direct and retroflexion views. Complications:            No immediate complications. Estimated blood loss:                            None. Estimated Blood Loss:     Estimated blood loss: none. Impression:               - One 6 mm polyp in the rectum, removed with a cold                             snare. Resected and retrieved.                           - One 4 mm polyp in the transverse colon, removed                            with a cold biopsy forceps. Resected and retrieved.                           - Normal appearing TI.                           - Internal hemorrhoids.                           - The examination was otherwise normal on direct  and retroflexion views. Recommendation:           - Repeat colonoscopy after studies are complete for                            surveillance based on pathology results.                           - Patient has a contact number available for                            emergencies. The signs and symptoms of potential                            delayed complications were discussed with the                            patient. Return to normal activities tomorrow.                            Written discharge instructions were provided to the                            patient.                           - Resume previous diet.                           - Continue present medications.                           - Await pathology results. Ladene Artist, MD 03/31/2021 1:52:05 PM This report has been signed electronically.

## 2021-03-31 NOTE — Progress Notes (Signed)
History & Physical  Primary Care Physician:  Wendie Agreste, MD Primary Gastroenterologist: Lucio Edward, MD  CHIEF COMPLAINT:  Personal history of colon polyps   HPI: Willie Lucas is a 56 y.o. male with a history of adenomatous colon polyps here for surveillance colonoscopy.    Past Medical History:  Diagnosis Date   Anxiety    Benign fundic gland polyps of stomach    Headache(784.0)    Hyperlipidemia    Hypertension    MS (multiple sclerosis) (HCC)    Neuromuscular disorder (HCC)    Ulcer    hx of duodenal ulcer at age 46    Past Surgical History:  Procedure Laterality Date   COLONOSCOPY     RHINOPLASTY     X2    Prior to Admission medications   Medication Sig Start Date End Date Taking? Authorizing Provider  atorvastatin (LIPITOR) 20 MG tablet Take 1 tablet (20 mg total) by mouth daily. 11/12/20  Yes Wendie Agreste, MD  omeprazole (PRILOSEC) 40 MG capsule TAKE 1 CAPSULE BY MOUTH EVERY DAY.NEED APPOINTMENT FOR MORE REFILLS! 07/06/18  Yes Ladene Artist, MD  clonazePAM (KLONOPIN) 0.5 MG tablet Take 1-2 tablets (0.5-1 mg total) by mouth 2 (two) times daily as needed (spasm, back). 09/09/20   Wendie Agreste, MD  methylPREDNISolone (MEDROL DOSEPAK) 4 MG TBPK tablet Take 6 tablets on day 1 and decrease by 1 tablet each day until finished Patient not taking: Reported on 03/31/2021 02/12/21   Sater, Nanine Means, MD  rizatriptan (MAXALT) 5 MG tablet TAKE 1 TABLET BY MOUTH AS NEEDED FOR HEADACHE. MAY REPEAT IN 2 HOURS IF NEEDED 02/11/21   Sater, Nanine Means, MD    Current Outpatient Medications  Medication Sig Dispense Refill   atorvastatin (LIPITOR) 20 MG tablet Take 1 tablet (20 mg total) by mouth daily. 90 tablet 1   omeprazole (PRILOSEC) 40 MG capsule TAKE 1 CAPSULE BY MOUTH EVERY DAY.NEED APPOINTMENT FOR MORE REFILLS! 30 capsule 0   clonazePAM (KLONOPIN) 0.5 MG tablet Take 1-2 tablets (0.5-1 mg total) by mouth 2 (two) times daily as needed (spasm, back). 20 tablet 1    methylPREDNISolone (MEDROL DOSEPAK) 4 MG TBPK tablet Take 6 tablets on day 1 and decrease by 1 tablet each day until finished (Patient not taking: Reported on 03/31/2021) 21 tablet 0   rizatriptan (MAXALT) 5 MG tablet TAKE 1 TABLET BY MOUTH AS NEEDED FOR HEADACHE. MAY REPEAT IN 2 HOURS IF NEEDED 10 tablet 6   Current Facility-Administered Medications  Medication Dose Route Frequency Provider Last Rate Last Admin   0.9 %  sodium chloride infusion  500 mL Intravenous Continuous Ladene Artist, MD       Facility-Administered Medications Ordered in Other Visits  Medication Dose Route Frequency Provider Last Rate Last Admin   gadopentetate dimeglumine (MAGNEVIST) injection 19 mL  19 mL Intravenous Once PRN Sater, Nanine Means, MD        Allergies as of 03/31/2021   (No Known Allergies)    Family History  Problem Relation Age of Onset   Arthritis Mother    Suicidality Father    Brain cancer Brother        died   Diabetes Maternal Grandmother    Colon cancer Neg Hx    Stomach cancer Neg Hx    Esophageal cancer Neg Hx    Pancreatic cancer Neg Hx     Social History   Socioeconomic History   Marital status: Married    Spouse  name: Not on file   Number of children: 2   Years of education: Not on file   Highest education level: Not on file  Occupational History   Occupation: Music therapist  Tobacco Use   Smoking status: Former    Packs/day: 0.25    Years: 10.00    Pack years: 2.50    Types: Cigarettes    Quit date: 1998    Years since quitting: 25.0   Smokeless tobacco: Never  Vaping Use   Vaping Use: Never used  Substance and Sexual Activity   Alcohol use: Not Currently    Comment: occ   Drug use: No   Sexual activity: Yes    Partners: Female  Other Topics Concern   Not on file  Social History Narrative   Not on file   Social Determinants of Health   Financial Resource Strain: Not on file  Food Insecurity: Not on file  Transportation Needs: Not on file   Physical Activity: Not on file  Stress: Not on file  Social Connections: Not on file  Intimate Partner Violence: Not on file    Review of Systems:  All systems reviewed an negative except where noted in HPI.  Gen: Denies any fever, chills, sweats, anorexia, fatigue, weakness, malaise, weight loss, and sleep disorder CV: Denies chest pain, angina, palpitations, syncope, orthopnea, PND, peripheral edema, and claudication. Resp: Denies dyspnea at rest, dyspnea with exercise, cough, sputum, wheezing, coughing up blood, and pleurisy. GI: Denies vomiting blood, jaundice, and fecal incontinence.   Denies dysphagia or odynophagia. GU : Denies urinary burning, blood in urine, urinary frequency, urinary hesitancy, nocturnal urination, and urinary incontinence. MS: Denies joint pain, limitation of movement, and swelling, stiffness, low back pain, extremity pain. Denies muscle weakness, cramps, atrophy.  Derm: Denies rash, itching, dry skin, hives, moles, warts, or unhealing ulcers.  Psych: Denies depression, anxiety, memory loss, suicidal ideation, hallucinations, paranoia, and confusion. Heme: Denies bruising, bleeding, and enlarged lymph nodes. Neuro:  Denies any headaches, dizziness, paresthesias. Endo:  Denies any problems with DM, thyroid, adrenal function.   Physical Exam: Vital signs in last 24 hours: General:  Alert, well-developed, in NAD Head:  Normocephalic and atraumatic. Eyes:  Sclera clear, no icterus.   Conjunctiva pink. Ears:  Normal auditory acuity. Mouth:  No deformity or lesions.  Neck:  Supple; no masses . Lungs:  Clear throughout to auscultation.   No wheezes, crackles, or rhonchi. No acute distress. Heart:  Regular rate and rhythm; no murmurs. Abdomen:  Soft, nondistended, nontender. No masses, hepatomegaly. No obvious masses.  Normal bowel .    Rectal:  Deferred to colonoscopy Msk:  Symmetrical without gross deformities.. Pulses:  Normal pulses noted. Extremities:   Without edema. Neurologic:  Alert and  oriented x4;  grossly normal neurologically. Skin:  Intact without significant lesions or rashes. Cervical Nodes:  No significant cervical adenopathy. Psych:  Alert and cooperative. Normal mood and affect.   Impression / Plan:   Personal history of adenomatous colon polyps for surveillance colonoscopy.    Pricilla Riffle. Fuller Plan  03/31/2021, 1:25 PM See Shea Evans, [facility], to contact our on call provider

## 2021-03-31 NOTE — Progress Notes (Signed)
To pacu, VSS. Report to [email or web address] 

## 2021-03-31 NOTE — Progress Notes (Signed)
Called to room to assist during endoscopic procedure.  Patient ID and intended procedure confirmed with present staff. Received instructions for my participation in the procedure from the performing physician.  

## 2021-03-31 NOTE — Patient Instructions (Signed)
Please read handouts provided. Continue present medications. Await pathology results.   YOU HAD AN ENDOSCOPIC PROCEDURE TODAY AT THE [facility]:   Refer to the procedure report that was given to you for any specific questions about what was found during the examination.  If the procedure report does not answer your questions, please call your gastroenterologist to clarify.  If you requested that your care partner not be given the details of your procedure findings, then the procedure report has been included in a sealed envelope for you to review at your convenience later.  YOU SHOULD EXPECT: Some feelings of bloating in the abdomen. Passage of more gas than usual.  Walking can help get rid of the air that was put into your GI tract during the procedure and reduce the bloating. If you had a lower endoscopy (such as a colonoscopy or flexible sigmoidoscopy) you may notice spotting of blood in your stool or on the toilet paper. If you underwent a bowel prep for your procedure, you may not have a normal bowel movement for a few days.  Please Note:  You might notice some irritation and congestion in your nose or some drainage.  This is from the oxygen used during your procedure.  There is no need for concern and it should clear up in a day or so.  SYMPTOMS TO REPORT IMMEDIATELY:  Following lower endoscopy (colonoscopy or flexible sigmoidoscopy):  Excessive amounts of blood in the stool  Significant tenderness or worsening of abdominal pains  Swelling of the abdomen that is new, acute  Fever of 100F or higher   For urgent or emergent issues, a gastroenterologist can be reached at any hour by calling [phone]. Do not use MyChart messaging for urgent concerns.    DIET:  We do recommend a small meal at first, but then you may proceed to your regular diet.  Drink plenty of fluids but you should avoid alcoholic beverages for 24 hours.  ACTIVITY:  You should plan to take it easy  for the rest of today and you should NOT DRIVE or use heavy machinery until tomorrow (because of the sedation medicines used during the test).    FOLLOW UP: Our staff will call the number listed on your records 48-72 hours following your procedure to check on you and address any questions or concerns that you may have regarding the information given to you following your procedure. If we do not reach you, we will leave a message.  We will attempt to reach you two times.  During this call, we will ask if you have developed any symptoms of COVID 19. If you develop any symptoms (ie: fever, flu-like symptoms, shortness of breath, cough etc.) before then, please call [phone].  If you test positive for Covid 19 in the 2 weeks post procedure, please call and report this information to us.    If any biopsies were taken you will be contacted by phone or by letter within the next 1-3 weeks.  Please call us at [phone] if you have not heard about the biopsies in 3 weeks.    SIGNATURES/CONFIDENTIALITY: You and/or your care partner have signed paperwork which will be entered into your electronic medical record.  These signatures attest to the fact that that the information above on your After Visit Summary has been reviewed and is understood.  Full responsibility of the confidentiality of this discharge information lies with you and/or your care-partner.  

## 2021-04-01 ENCOUNTER — Telehealth: Payer: Self-pay

## 2021-04-01 DIAGNOSIS — L503 Dermatographic urticaria: Secondary | ICD-10-CM | POA: Diagnosis not present

## 2021-04-01 DIAGNOSIS — J309 Allergic rhinitis, unspecified: Secondary | ICD-10-CM | POA: Diagnosis not present

## 2021-04-01 NOTE — Telephone Encounter (Signed)
-----   Message from Ladene Artist, MD sent at 03/31/2021  2:03 PM EST ----- Please contact this patient tomorrow to schedule hemorrhoid banding. He had colonoscopy this afternoon. Thx.

## 2021-04-01 NOTE — Telephone Encounter (Signed)
Scheduled patient for hemorrhoid banding on 04/22/21 at 3:40 pm

## 2021-04-01 NOTE — Telephone Encounter (Signed)
Left message for patient to return my call.

## 2021-04-02 ENCOUNTER — Telehealth: Payer: Self-pay

## 2021-04-02 NOTE — Telephone Encounter (Signed)
Left message on follow up call. 

## 2021-04-02 NOTE — Telephone Encounter (Signed)
°  Follow up Call-  Call back number 03/31/2021  Post procedure Call Back phone  # [phone]  Permission to leave phone message Yes  Some recent data might be hidden     Patient questions:  Do you have a fever, pain , or abdominal swelling? No. Pain Score  0 *  Have you tolerated food without any problems? Yes.    Have you been able to return to your normal activities? Yes.    Do you have any questions about your discharge instructions: Diet   No. Medications  No. Follow up visit  No.  Do you have questions or concerns about your Care? No.  Actions: * If pain score is 4 or above: No action needed, pain <4.

## 2021-04-08 ENCOUNTER — Encounter: Payer: Self-pay | Admitting: Gastroenterology

## 2021-04-09 HISTORY — PX: HEMORRHOID SURGERY: SHX153

## 2021-04-22 ENCOUNTER — Ambulatory Visit (INDEPENDENT_AMBULATORY_CARE_PROVIDER_SITE_OTHER): Payer: BC Managed Care – PPO | Admitting: Gastroenterology

## 2021-04-22 ENCOUNTER — Encounter: Payer: Self-pay | Admitting: Gastroenterology

## 2021-04-22 VITALS — BP 138/62 | HR 60 | Ht 70.0 in | Wt 193.4 lb

## 2021-04-22 DIAGNOSIS — K641 Second degree hemorrhoids: Secondary | ICD-10-CM

## 2021-04-22 NOTE — Progress Notes (Signed)
PROCEDURE NOTE: The patient presents with symptomatic moderate grade II hemorrhoids, requesting rubber band ligation of their hemorrhoidal disease.  All risks, benefits and alternative forms of therapy were described and informed consent was obtained.  The anorectum was pre-medicated during digital exam with 0.125% NTG and 5% lidocaine. In the Left Lateral Decubitus position anoscopic examination revealed grade II hemorrhoids in the RP > RA > LL positions.   The decision was made to band the RP internal hemorrhoid, and the Ponder was used to perform band ligation without complication.  Digital anorectal examination was then performed to assure proper positioning of the band and to adjust the banded tissue as required. No adjustment was needed.  No complications were encountered and the patient tolerated the procedure well.  Dietary and behavioral recommendations were given and along with follow-up instructions.   Return for possible additional hemorrhoid banding in 2 - 3 weeks as required.  High fiber diet, Benefiber daily and at least 8 glasses of water daily. The patient was discharged home without pain or other post procedure problems.

## 2021-04-22 NOTE — Patient Instructions (Signed)

## 2021-04-23 ENCOUNTER — Encounter: Payer: Self-pay | Admitting: Cardiology

## 2021-04-23 ENCOUNTER — Ambulatory Visit: Payer: BC Managed Care – PPO | Admitting: Cardiology

## 2021-04-23 ENCOUNTER — Other Ambulatory Visit: Payer: Self-pay

## 2021-04-23 ENCOUNTER — Telehealth: Payer: Self-pay | Admitting: Gastroenterology

## 2021-04-23 VITALS — BP 136/75 | HR 61 | Temp 98.0°F | Resp 16 | Ht 70.0 in | Wt 199.0 lb

## 2021-04-23 DIAGNOSIS — R55 Syncope and collapse: Secondary | ICD-10-CM

## 2021-04-23 MED ORDER — TRAMADOL HCL 50 MG PO TABS: ORAL_TABLET | ORAL | 0 refills | Status: AC

## 2021-04-23 NOTE — Telephone Encounter (Signed)
Patient reports that he was awakened suddenly in the night with severe rectal pain.  He got out of the bed and "next thing I knew I was on the floor". We discussed that possible that he was orthostatic, stimulated his vagus nerve, or the pain caused him to pass out. He is feeling better now.  Has some rectal pain, but not unbearable at this time.  We discussed sitzbaths, recti care and he will pick up the Tramadol to have on hand if the Mortin and Tylenol do not relieve his discomfort.  He understands to call back for any additional questions or concerns.

## 2021-04-23 NOTE — Telephone Encounter (Signed)
Post hemorrhoid banding yesterday. If pain is not controlled with max dosing of Tylenol and Advil then begin Tramadol 1-2 po tid prn pain.  Sitz baths in warm water tid. Call if symptoms are not improving.

## 2021-04-23 NOTE — Progress Notes (Signed)
Patient referred by Wendie Agreste, MD for syncope  Subjective:   Willie Lucas, male    DOB: 1965-09-06, 56 y.o.   MRN: [id number]   Chief Complaint  Patient presents with   Loss of Consciousness   Follow-up    3 month   Results    Echo      HPI  56 y.o. Caucasian male with h/o multiple sclerosis, referred for syncope  Since his last visit with me, patient was diagnosed to have internal hemorrhoids on colonoscopy.  He underwent banding for the same. The same night, patient experienced severe abdominal pain at night with urge to have a bowel movement.  Patient stood up to go to the bathroom and had a syncopal episode without any significant warning signs.  He has not noticed any arrhythmias on his smart watch.  Reviewed recent echocardiogram results with the patient, details below.  Initial consultation visit 12/2020: Patient works as a Music therapist.  He is very active, works out in a boot camp at least once a week.  This includes a 90-minute workout of intense aerobic activity, as well as weight training.  In addition, he performs other low intensity exercises, such as walking, on other days.  Patient has had 2 episodes of syncope a year apart.  Both episodes were preceded by intense rectal pain, followed by lightheadedness, syncope, with quick recovery and a few minutes without any loss of bowel or bladder tone or confusion.  First episode occurred while on a boat in the [place].  Second episode occurred while on a flight.  Patient denies any prior syncopal episodes.  He denies any chest pain, shortness of breath, either before the syncopal episodes, or during any physical exertion.  Patient uses an apple watch regularly.  On a separate note, patient has history of multiple sclerosis.  At one point, he was on therapy for the same.  His worst symptoms have included paraplegia.  He has been off medications for last 10 years without any severe symptoms.  He has had  occasional tingling numbness from time to time.  He is regularly followed by neurologist Dr. Felecia Shelling.  He denies any melena, hematochezia symptoms.  On review, it appears that he has had history of fundic gland polyps of stomach.  He denies any other known GI diagnoses.  Current Outpatient Medications on File Prior to Visit  Medication Sig Dispense Refill   atorvastatin (LIPITOR) 20 MG tablet Take 1 tablet (20 mg total) by mouth daily. 90 tablet 1   clonazePAM (KLONOPIN) 0.5 MG tablet Take 1-2 tablets (0.5-1 mg total) by mouth 2 (two) times daily as needed (spasm, back). 20 tablet 1   omeprazole (PRILOSEC) 40 MG capsule TAKE 1 CAPSULE BY MOUTH EVERY DAY.NEED APPOINTMENT FOR MORE REFILLS! 30 capsule 0   rizatriptan (MAXALT) 5 MG tablet TAKE 1 TABLET BY MOUTH AS NEEDED FOR HEADACHE. MAY REPEAT IN 2 HOURS IF NEEDED 10 tablet 6   Current Facility-Administered Medications on File Prior to Visit  Medication Dose Route Frequency Provider Last Rate Last Admin   gadopentetate dimeglumine (MAGNEVIST) injection 19 mL  19 mL Intravenous Once PRN Sater, Nanine Means, MD        Cardiovascular and other pertinent studies:  Echocardiogram 03/04/2021:  Left ventricle cavity is normal in size and wall thickness. Normal global  wall motion. Normal LV systolic function with visual EF 65-70%. Normal  diastolic filling pattern.  No significant valvular abnormality.  Normal right atrial pressure.  EKG 01/20/2021: Sinus rhythm 53 bpm Left axis -anterior fascicular block  No results found for this or any previous visit from the past 1095 days.     Recent labs: 12/25/2020: Glucose 89, BUN/Cr 14/0.99. EGFR 86. Na/K 138/4.3. Rest of the CMP normal H/H 15/46. MCV 94. Platelets 129 HbA1C N/A Chol 210, TG 132, HDL 57, LDL 126 TSH N/A    Review of Systems  Cardiovascular:  Positive for syncope. Negative for chest pain, dyspnea on exertion, leg swelling and palpitations.        Vitals:   04/23/21 1421   BP: 136/75  Pulse: 61  Resp: 16  Temp: 98 F (36.7 C)  SpO2: 99%    Body mass index is 28.55 kg/m. Filed Weights   04/23/21 1421  Weight: 199 lb (90.3 kg)     Objective:   Physical Exam Vitals and nursing note reviewed.  Constitutional:      General: He is not in acute distress. Neck:     Vascular: No JVD.  Cardiovascular:     Rate and Rhythm: Normal rate and regular rhythm.     Pulses: Normal pulses.     Heart sounds: Normal heart sounds. No murmur heard. Pulmonary:     Effort: Pulmonary effort is normal.     Breath sounds: Normal breath sounds. No wheezing or rales.  Musculoskeletal:     Right lower leg: No edema.     Left lower leg: No edema.         Assessment & Recommendations:   56 y.o. Caucasian male with h/o multiple sclerosis, referred for syncope  Syncope: Classical vasovagal syncope, triggered by intense rectal or abdominal pain.   No structural abnormality on echocardiogram.  No significant arrhythmia noted on patient's smart watch.   I recommend he could potentially have syncopal episodes with his trigger being abdominal pain.  Continue follow-up with PCP and GI Dr. Fuller Plan regarding management of the same. Again discussed counterpressure maneuvers, liberal hydration, compression stockings.  I will see him on as-needed basis.   Nigel Mormon, MD Pager: [phone] Office: [phone]

## 2021-04-23 NOTE — Telephone Encounter (Signed)
Inbound call from patient states had procedure yesterday and in the middle of the night he passed out. States he is in severe pain but feeling stable.

## 2021-04-23 NOTE — Telephone Encounter (Signed)
Left message for patient to call back I left a detailed message I am sending in RX.  He is asked to pick up the rx and recticare and call me back to further discuss

## 2021-04-30 ENCOUNTER — Encounter: Payer: Self-pay | Admitting: Family Medicine

## 2021-04-30 ENCOUNTER — Ambulatory Visit (INDEPENDENT_AMBULATORY_CARE_PROVIDER_SITE_OTHER): Payer: BC Managed Care – PPO | Admitting: Family Medicine

## 2021-04-30 VITALS — BP 128/74 | HR 68 | Temp 98.1°F | Resp 17 | Ht 70.0 in | Wt 193.8 lb

## 2021-04-30 DIAGNOSIS — S39012A Strain of muscle, fascia and tendon of lower back, initial encounter: Secondary | ICD-10-CM | POA: Diagnosis not present

## 2021-04-30 DIAGNOSIS — Z0001 Encounter for general adult medical examination with abnormal findings: Secondary | ICD-10-CM | POA: Diagnosis not present

## 2021-04-30 DIAGNOSIS — R21 Rash and other nonspecific skin eruption: Secondary | ICD-10-CM

## 2021-04-30 DIAGNOSIS — E785 Hyperlipidemia, unspecified: Secondary | ICD-10-CM | POA: Diagnosis not present

## 2021-04-30 DIAGNOSIS — Z0000 Encounter for general adult medical examination without abnormal findings: Secondary | ICD-10-CM

## 2021-04-30 DIAGNOSIS — L503 Dermatographic urticaria: Secondary | ICD-10-CM

## 2021-04-30 DIAGNOSIS — R55 Syncope and collapse: Secondary | ICD-10-CM

## 2021-04-30 DIAGNOSIS — Z131 Encounter for screening for diabetes mellitus: Secondary | ICD-10-CM

## 2021-04-30 DIAGNOSIS — Z130 Encounter for screening for diseases of the blood and blood-forming organs and certain disorders involving the immune mechanism: Secondary | ICD-10-CM | POA: Diagnosis not present

## 2021-04-30 LAB — CBC WITH DIFFERENTIAL/PLATELET
Basophils Absolute: 0 10*3/uL (ref 0.0–0.1)
Basophils Relative: 0.6 % (ref 0.0–3.0)
Eosinophils Absolute: 0.1 10*3/uL (ref 0.0–0.7)
Eosinophils Relative: 2.3 % (ref 0.0–5.0)
HCT: 45.9 % (ref 39.0–52.0)
Hemoglobin: 15.5 g/dL (ref 13.0–17.0)
Lymphocytes Relative: 26.9 % (ref 12.0–46.0)
Lymphs Abs: 1.7 10*3/uL (ref 0.7–4.0)
MCHC: 33.8 g/dL (ref 30.0–36.0)
MCV: 91.6 fl (ref 78.0–100.0)
Monocytes Absolute: 0.5 10*3/uL (ref 0.1–1.0)
Monocytes Relative: 7.4 % (ref 3.0–12.0)
Neutro Abs: 4 10*3/uL (ref 1.4–7.7)
Neutrophils Relative %: 62.8 % (ref 43.0–77.0)
Platelets: 124 10*3/uL — ABNORMAL LOW (ref 150.0–400.0)
RBC: 5.01 Mil/uL (ref 4.22–5.81)
RDW: 13.1 % (ref 11.5–15.5)
WBC: 6.4 10*3/uL (ref 4.0–10.5)

## 2021-04-30 LAB — HEMOGLOBIN A1C: Hgb A1c MFr Bld: 5.2 % (ref 4.6–6.5)

## 2021-04-30 MED ORDER — TRIAMCINOLONE ACETONIDE 0.1 % EX CREA: 1.0000 "application " | TOPICAL_CREAM | Freq: Two times a day (BID) | CUTANEOUS | 2 refills | Status: DC

## 2021-04-30 MED ORDER — ATORVASTATIN CALCIUM 20 MG PO TABS: 20.0000 mg | ORAL_TABLET | Freq: Every day | ORAL | 1 refills | Status: DC

## 2021-04-30 MED ORDER — CLONAZEPAM 0.5 MG PO TABS: 0.5000 mg | ORAL_TABLET | Freq: Two times a day (BID) | ORAL | 1 refills | Status: AC | PRN

## 2021-04-30 NOTE — Patient Instructions (Addendum)
I would discuss the syncope episodes with neurologist, but recent cardiology visit indicated likely vagal episode. Hopefully treating the hemorrhoids will help lessen the chance of recurrence. If you do have further treatments, then would recommend close monitoring in the days afterward and in safe environment to lessen risk of injury.   Steroid cream if needed.   No med changes today. If any concerns on labs I will let you know. Thanks for coming in today.   Preventive Care 55-36 Years Old, Male Preventive care refers to lifestyle choices and visits with your health care provider that can promote health and wellness. Preventive care visits are also called wellness exams. What can I expect for my preventive care visit? Counseling During your preventive care visit, your health care provider may ask about your: Medical history, including: Past medical problems. Family medical history. Current health, including: Emotional well-being. Home life and relationship well-being. Sexual activity. Lifestyle, including: Alcohol, nicotine or tobacco, and drug use. Access to firearms. Diet, exercise, and sleep habits. Safety issues such as seatbelt and bike helmet use. Sunscreen use. Work and work Statistician. Physical exam Your health care provider will check your: Height and weight. These may be used to calculate your BMI (body mass index). BMI is a measurement that tells if you are at a healthy weight. Waist circumference. This measures the distance around your waistline. This measurement also tells if you are at a healthy weight and may help predict your risk of certain diseases, such as type 2 diabetes and high blood pressure. Heart rate and blood pressure. Body temperature. Skin for abnormal spots. What immunizations do I need? Vaccines are usually given at various ages, according to a schedule. Your health care provider will recommend vaccines for you based on your age, medical history, and  lifestyle or other factors, such as travel or where you work. What tests do I need? Screening Your health care provider may recommend screening tests for certain conditions. This may include: Lipid and cholesterol levels. Diabetes screening. This is done by checking your blood sugar (glucose) after you have not eaten for a while (fasting). Hepatitis B test. Hepatitis C test. HIV (human immunodeficiency virus) test. STI (sexually transmitted infection) testing, if you are at risk. Lung cancer screening. Prostate cancer screening. Colorectal cancer screening. Talk with your health care provider about your test results, treatment options, and if necessary, the need for more tests. Follow these instructions at home: Eating and drinking  Eat a diet that includes fresh fruits and vegetables, whole grains, lean protein, and low-fat dairy products. Take vitamin and mineral supplements as recommended by your health care provider. Do not drink alcohol if your health care provider tells you not to drink. If you drink alcohol: Limit how much you have to 0-2 drinks a day. Know how much alcohol is in your drink. In the [place] one drink equals one 12 oz bottle of beer (355 mL), one 5 oz glass of wine (148 mL), or one 1 oz glass of hard liquor (44 mL). Lifestyle Brush your teeth every morning and night with fluoride toothpaste. Floss one time each day. Exercise for at least 30 minutes 5 or more days each week. Do not use any products that contain nicotine or tobacco. These products include cigarettes, chewing tobacco, and vaping devices, such as e-cigarettes. If you need help quitting, ask your health care provider. Do not use drugs. If you are sexually active, practice safe sex. Use a condom or other form of protection to prevent  STIs. Take aspirin only as told by your health care provider. Make sure that you understand how much to take and what form to take. Work with your health care provider to find  out whether it is safe and beneficial for you to take aspirin daily. Find healthy ways to manage stress, such as: Meditation, yoga, or listening to music. Journaling. Talking to a trusted person. Spending time with friends and family. Minimize exposure to UV radiation to reduce your risk of skin cancer. Safety Always wear your seat belt while driving or riding in a vehicle. Do not drive: If you have been drinking alcohol. Do not ride with someone who has been drinking. When you are tired or distracted. While texting. If you have been using any mind-altering substances or drugs. Wear a helmet and other protective equipment during sports activities. If you have firearms in your house, make sure you follow all gun safety procedures. What's next? Go to your health care provider once a year for an annual wellness visit. Ask your health care provider how often you should have your eyes and teeth checked. Stay up to date on all vaccines. This information is not intended to replace advice given to you by your health care provider. Make sure you discuss any questions you have with your health care provider. Document Revised: 09/04/2020 Document Reviewed: 09/04/2020 Elsevier Patient Education  [place].

## 2021-04-30 NOTE — Progress Notes (Signed)
Subjective:  Patient ID: Willie Lucas, male    DOB: May 03, 1965  Age: 56 y.o. MRN: [id number]  CC:  Chief Complaint  Patient presents with   Annual Exam    Pt here for annual exam, doing well pt is requesting refill cream for an itchy rash that he gets periodically states cream clears this     HPI Willie Lucas presents for   Annual exam.   Episodic rash: In groin, stomach in past. Saw derm - had multiple tests. Hx of dermatographism. Has used steroid cream in past - seems to help when occurs.   IBS, GERD Followed by gastroenterology, Dr. Fuller Lucas.  Treated with Prilosec daily.  I last saw him October, few episodes of syncope discussed at that time.  Was recently seen by cardiology for classic vasovagal syncope triggered by intense rectal abdominal pain.  Avoidance measures/counterpressure measures discussed as well as hydration and compression stockings.  Follow-up as needed. Had banding for hemorrhoids recently, triggered vagal episode later that night with pain. Appears to be associated with rectal pain each time. Postponed additional hemorrhoid treatment. Does have some warning prior to episodes.   Multiple sclerosis Relapsing remitting with history of migraines.  Followed by neurology.  Dr. Felecia Lucas.  Prior treatment with Copaxone.  Off disease modifying therapy since 2012.  MRIs have been stable, last appointment with neurology in June 2022, 1 year repeat planned.  Maxalt for migraines as needed.  Trigger point injections given previously. History of episodic back pain flares underlying degenerative disc disease.  Improves with prednisone and Klonopin as needed for spasms - Back doing ok recently. Last filled klonopin 09/09/20. Controlled substance database (PDMP) reviewed. No concerns appreciated.    Hyperlipidemia: Lipitor 20 mg daily. No new side effects. Has increased fruit and vegetables. Better diet as well.  Lab Results  Component Value Date   CHOL 210 (H) 10/21/2020   HDL  57.10 10/21/2020   LDLCALC 126 (H) 10/21/2020   LDLDIRECT 147.0 09/09/2020   TRIG 132.0 10/21/2020   CHOLHDL 4 10/21/2020   Lab Results  Component Value Date   ALT 16 10/21/2020   AST 18 10/21/2020   ALKPHOS 79 10/21/2020   BILITOT 0.7 10/21/2020   Depression screen PHQ 2/9 04/30/2021 12/25/2020 10/21/2020 09/09/2020 09/18/2016  Decreased Interest 0 0 0 0 0  Down, Depressed, Hopeless 0 0 0 0 0  PHQ - 2 Score 0 0 0 0 0  Altered sleeping - 0 - 0 -  Tired, decreased energy - 0 - 0 -  Change in appetite - 0 - 0 -  Feeling bad or failure about yourself  - 0 - 0 -  Trouble concentrating - 0 - 0 -  Moving slowly or fidgety/restless - 0 - 0 -  Suicidal thoughts - 0 - 0 -  PHQ-9 Score - 0 - 0 -    Cancer screening Colonoscopy 03/31/2021, repeat 7 years No results found for: PSA1, PSA No family history of prostate cancer.  The natural history of prostate cancer and ongoing controversy regarding screening and potential treatment outcomes of prostate cancer has been discussed with the patient. The meaning of a false positive PSA and a false negative PSA has been discussed. He indicates understanding of the limitations of this screening test and wishes NOT to proceed with screening PSA testing.  Immunization History  Administered Date(s) Administered   Influenza,inj,Quad PF,6+ Mos 12/25/2020   Moderna Sars-Covid-2 Vaccination 06/10/2019, 07/17/2019, 01/22/2020, 07/28/2020   Tdap 09/09/2020   Zoster Recombinat (  Shingrix) 09/09/2020, 12/25/2020  Discussed bivalent vaccine.   No results found. Saw optho recently, then retinal specialist - MS associated. No interventions.   Dental: Every 6 months  Alcohol: 1-3 per week.   Tobacco: None  Exercise: Recently decreased. Usually walking 5k step per day, pickleball on weekends. Some wrist issues - planned NCV eval. Followed by Willie Lucas.   History Patient Active Problem List   Diagnosis Date Noted   Syncope and collapse 01/20/2021    Bradycardia 01/20/2021   Acute right-sided low back pain without sciatica 09/18/2020   Acute diarrhea 12/06/2019   Generalized abdominal pain 12/06/2019   Irritable bowel syndrome with diarrhea 12/06/2019   Numbness 05/12/2018   Paresthesias 11/25/2016   Common migraine without intractability 07/04/2014   Exercise-induced reactive airway disease 04/24/2014   Pure hypercholesterolemia 03/08/2013   SCIATICA 07/16/2009   MUSCLE SPASM, LUMBOSACRAL REGION 07/16/2009   Lumbar radicular pain 07/16/2009   URI 04/30/2009   MULTIPLE SCLEROSIS 03/28/2009   ACUT PEPTC ULCR UNS SITE W/HEM W/O MENTION OBST 03/28/2009   HEADACHE 03/28/2009   Past Medical History:  Diagnosis Date   Anxiety    Benign fundic gland polyps of stomach    Headache(784.0)    Hyperlipidemia    Hypertension    MS (multiple sclerosis) (HCC)    Neuromuscular disorder (HCC)    Ulcer    hx of duodenal ulcer at age 50   Past Surgical History:  Procedure Laterality Date   COLONOSCOPY     HEMORRHOID SURGERY  04/09/2021   RHINOPLASTY     X2   No Known Allergies Prior to Admission medications   Medication Sig Start Date End Date Taking? Authorizing Provider  atorvastatin (LIPITOR) 20 MG tablet Take 1 tablet (20 mg total) by mouth daily. 11/12/20   Willie Agreste, MD  clonazePAM (KLONOPIN) 0.5 MG tablet Take 1-2 tablets (0.5-1 mg total) by mouth 2 (two) times daily as needed (spasm, back). 09/09/20   Willie Agreste, MD  omeprazole (PRILOSEC) 40 MG capsule TAKE 1 CAPSULE BY MOUTH EVERY DAY.NEED APPOINTMENT FOR MORE REFILLS! 07/06/18   Willie Artist, MD  rizatriptan (MAXALT) 5 MG tablet TAKE 1 TABLET BY MOUTH AS NEEDED FOR HEADACHE. MAY REPEAT IN 2 HOURS IF NEEDED 02/11/21   Willie Lucas, Willie Means, MD  traMADol Veatrice Bourbon) 50 MG tablet Take one tablet by mouth three times a day as needed for pain 04/23/21   Willie Artist, MD   Social History   Socioeconomic History   Marital status: Married    Spouse name: Not on file    Number of children: 2   Years of education: Not on file   Highest education level: Not on file  Occupational History   Occupation: Music therapist  Tobacco Use   Smoking status: Former    Packs/day: 0.25    Years: 10.00    Pack years: 2.50    Types: Cigarettes    Quit date: 1998    Years since quitting: 25.1   Smokeless tobacco: Never  Vaping Use   Vaping Use: Never used  Substance and Sexual Activity   Alcohol use: Yes    Comment: occ   Drug use: No   Sexual activity: Yes    Partners: Female  Other Topics Concern   Not on file  Social History Narrative   Not on file   Social Determinants of Health   Financial Resource Strain: Not on file  Food Insecurity: Not on file  Transportation Needs: Not  on file  Physical Activity: Not on file  Stress: Not on file  Social Connections: Not on file  Intimate Partner Violence: Not on file    Review of Systems 13 point review of systems per patient health survey noted.  Negative other than as indicated above or in HPI.    Objective:   Vitals:   04/30/21 0811  BP: 128/74  Pulse: 68  Resp: 17  Temp: 98.1 F (36.7 C)  TempSrc: Temporal  SpO2: 96%  Weight: 193 lb 12.8 oz (87.9 kg)  Height: 5\' 10"  (1.778 m)    Physical Exam Vitals reviewed.  Constitutional:      Appearance: He is well-developed.  HENT:     Head: Normocephalic and atraumatic.     Right Ear: External ear normal.     Left Ear: External ear normal.  Eyes:     Conjunctiva/sclera: Conjunctivae normal.     Pupils: Pupils are equal, round, and reactive to light.  Neck:     Thyroid: No thyromegaly.     Vascular: No carotid bruit or JVD.  Cardiovascular:     Rate and Rhythm: Normal rate and regular rhythm.     Heart sounds: Normal heart sounds. No murmur heard. Pulmonary:     Effort: Pulmonary effort is normal. No respiratory distress.     Breath sounds: Normal breath sounds. No wheezing or rales.  Abdominal:     General: There is no distension.      Palpations: Abdomen is soft.     Tenderness: There is no abdominal tenderness.  Musculoskeletal:        General: No tenderness. Normal range of motion.     Cervical back: Normal range of motion and neck supple.     Right lower leg: No edema.     Left lower leg: No edema.  Lymphadenopathy:     Cervical: No cervical adenopathy.  Skin:    General: Skin is warm and dry.  Neurological:     Mental Status: He is alert and oriented to person, place, and time.     Deep Tendon Reflexes: Reflexes are normal and symmetric.  Psychiatric:        Mood and Affect: Mood normal.        Behavior: Behavior normal.    Assessment & Lucas:  Zenas Santa is a 56 y.o. male . Annual physical exam - Lucas: Comprehensive metabolic panel, Lipid panel, Hemoglobin A1c, CBC with Differential/Platelet  - -anticipatory guidance as below in AVS, screening labs above. Health maintenance items as above in HPI discussed/recommended as applicable.   Hyperlipidemia, unspecified hyperlipidemia type - Lucas: atorvastatin (LIPITOR) 20 MG tablet, Comprehensive metabolic panel, Lipid panel  -  Stable, tolerating current regimen. Medications refilled. Labs pending as above.   Strain of lumbar region, initial encounter - Lucas: clonazePAM (KLONOPIN) 0.5 MG tablet  -Episodic flares of low back pain.  Chronic pain has been helpful for spasm.  Refill ordered.  Infrequent use.  Controlled substance database reviewed as above without concerns.  Screening for deficiency anemia - Lucas: CBC with Differential/Platelet  Screening for diabetes mellitus - Lucas: Hemoglobin A1c  Vaso vagal episode  -Appears to be related to rectal pain.  Recent cardiology eval, symptoms consistent with vasovagal episodes.  Continue follow-up with gastroenterology, can discuss further with neurology, as history of MS, but recommended close monitoring after any hemorrhoid procedure or if he does have start of rectal pain/pressure noticed to stop driving, call [phone]  if needed.  Should also be  in a safe environment at home if he notices start of rectal pain/pressure in case he does have vagal episode.  Maintain hydration.  ER/RTC precautions.  Rash and nonspecific skin eruption Dermatographism - Lucas: triamcinolone cream (KENALOG) 0.1 %  -Infrequent symptoms.  Triamcinolone prescribed if needed for flare.  No orders of the defined types were placed in this encounter.  Patient Instructions  I would discuss the syncope episodes with neurologist, but recent cardiology visit indicated likely vagal episode. Hopefully treating the hemorrhoids will help lessen the chance of recurrence. If you do have further treatments, then would recommend close monitoring in the days afterward and in safe environment to lessen risk of injury.         Signed,   Merri Ray, MD [place] 04/30/21 8:49 AM

## 2021-05-01 LAB — COMPREHENSIVE METABOLIC PANEL
ALT: 20 U/L (ref 0–53)
AST: 18 U/L (ref 0–37)
Albumin: 4.3 g/dL (ref 3.5–5.2)
Alkaline Phosphatase: 75 U/L (ref 39–117)
BUN: 16 mg/dL (ref 6–23)
CO2: 31 mEq/L (ref 19–32)
Calcium: 9.2 mg/dL (ref 8.4–10.5)
Chloride: 103 mEq/L (ref 96–112)
Creatinine, Ser: 1.03 mg/dL (ref 0.40–1.50)
GFR: 82.53 mL/min (ref >60.00)
Glucose, Bld: 90 mg/dL (ref 70–99)
Potassium: 4.8 mEq/L (ref 3.5–5.1)
Sodium: 140 mEq/L (ref 135–145)
Total Bilirubin: 0.6 mg/dL (ref 0.2–1.2)
Total Protein: 6.5 g/dL (ref 6.0–8.3)

## 2021-05-01 LAB — LIPID PANEL
Cholesterol: 168 mg/dL (ref 0–200)
HDL: 48.7 mg/dL (ref >39.00)
LDL Cholesterol: 97 mg/dL (ref 0–99)
NonHDL: 119.24
Total CHOL/HDL Ratio: 3
Triglycerides: 109 mg/dL (ref 0.0–149.0)
VLDL: 21.8 mg/dL (ref 0.0–40.0)

## 2021-05-09 ENCOUNTER — Encounter: Payer: Self-pay | Admitting: Family Medicine

## 2021-05-09 NOTE — Telephone Encounter (Signed)
Pt appreciates note regarding results but would like to know if CO2 level is something to be concerned about or if this is something he should be working on actively   Please advise

## 2021-05-20 ENCOUNTER — Encounter: Payer: BC Managed Care – PPO | Admitting: Gastroenterology

## 2021-06-18 ENCOUNTER — Encounter (INDEPENDENT_AMBULATORY_CARE_PROVIDER_SITE_OTHER): Payer: BC Managed Care – PPO | Admitting: Neurology

## 2021-06-18 ENCOUNTER — Ambulatory Visit (INDEPENDENT_AMBULATORY_CARE_PROVIDER_SITE_OTHER): Payer: BC Managed Care – PPO | Admitting: Neurology

## 2021-06-18 DIAGNOSIS — M79601 Pain in right arm: Secondary | ICD-10-CM | POA: Insufficient documentation

## 2021-06-18 DIAGNOSIS — G35 Multiple sclerosis: Secondary | ICD-10-CM | POA: Diagnosis not present

## 2021-06-18 DIAGNOSIS — Z0289 Encounter for other administrative examinations: Secondary | ICD-10-CM

## 2021-06-18 DIAGNOSIS — R29898 Other symptoms and signs involving the musculoskeletal system: Secondary | ICD-10-CM | POA: Diagnosis not present

## 2021-06-18 NOTE — Progress Notes (Signed)
? ? ? ?   ?Full Name: Callum Wolf Gender: Male ?MRN #: [id number] Date of Birth: 07-27-1965 ?   ?Visit Date: 06/18/2021 14:16 ?Age: 56 Years ?Examining Physician: Arlice Colt, MD  ?Referring Physician: Iran Planas, MD ?Height: 5 feet 10 inch ?Patient History: 193lbs ?   ?History and physical: ?Mr. [name] is a 56 year old man with mild multiple sclerosis reporting pain and weakness of the right arm.  He notes that he did 12,000 push-ups in 1 month 3 years ago.  At that time, he had pain in the forearm and weakness in the wrist and finger flexors.  He had difficulty shaking hands for making a fist.  Symptoms improved.  A couple months ago, he had the onset of right wrist pain.[name]  He felt slight weakness but not nearly as severe as symptoms 3 years ago.  MRI of the wrist 03/10/2021 showed thinning of the triangular fibrocartilage complex central disc but no full-thickness perforation.  There was mild proximal hamate chondral thinning and minimal subchondral marrow edema.  There was mild fraying of the central scapholunate ligament.  Compared to last month, his symptoms have improved.  I have seen her for MS.  He is fortunate to have a very mild case.  His initial symptoms 20 years ago involved right sided weakness, arm more than leg and he was found to have a cervical spine plaque to the right at C3-C4. ? ?Currently, he is alert and oriented.  Range of motion was normal in the neck.  He had minimal cervical paraspinal tenderness.  Strength and sensation was normal in the arm, wrist and hand.  Station and gait are normal.  Reflexes were symmetric. ? ?Nerve conduction studies: ?Bilateral median and ulnar motor responses had normal distal latencies, amplitudes and conduction velocities.  Bilateral median and ulnar sensory responses had normal peak latencies and amplitudes.  Ulnar F-wave latencies were normal. ? ?Electromyography: ?Needle EMG of selected muscles in the right arm was performed.  There was mild chronic  elevation in the flexor pollicis longus muscle and the flexor digitorum profundus (median) showed some polyphasic motor units.  Other muscles evaluated abnormal motor unit morphology and recruitment.  There was no abnormal spontaneous activity. ? ?Impression: ?There is no evidence of median or ulnar neuropathies across the wrist.  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 ?No evidence of chronic radiculopathy. ? ?Plan: ?We discussed that he possibly had a mild compartment syndrome from overuse when he was exercising vigorously that could have mildly damaged the anterior interosseous nerve.  Strength was normal today and I do not think further intervention would be needed. ?We will check MRI of the cervical spine to rule out other sources of his new symptoms including additional MS lesions or radiculopathy ?He continues off of a disease modifying therapy.  He will follow-up annually for MS or as needed for other issues ? ?Halsey Persaud A. Felecia Shelling, MD, PhD, Charlynn Grimes ?Certified in Neurology, Clinical Neurophysiology, Sleep Medicine, Pain Medicine and Neuroimaging ?Director, [facility] ? ?[facility] ?[address], [address] ?[place], [place] [address] ?[phone] ? ? ? ? ? ? ? ? ?   ?[place] ?   ?Nerve / Sites Muscle Latency Ref. Amplitude Ref. Rel Amp Segments Distance Velocity Ref. Area  ?  ms ms mV mV %  cm m/s m/s mVms  ?  L Median - APB  ?   Wrist APB 4.0 ?4.4 11.1 ?4.0 100 Wrist - APB 7   46.3  ?   Upper arm APB 8.1  10.6  95.3 Upper arm - Wrist 23 55 ?49 44.9  ?R Median - APB  ?   Wrist APB 3.9 ?4.4 13.6 ?4.0 100 Wrist - APB 7   56.1  ?   Upper arm APB 7.6  13.5  99.3 Upper arm - Wrist 21 57 ?49 55.8  ?L Ulnar - ADM  ?   Wrist ADM 3.3 ?3.3 11.9 ?6.0 100 Wrist - ADM 7   41.1  ?   B.Elbow ADM 6.7  11.3   94.9 B.Elbow - Wrist 18 52 ?49 39.9  ?   A.Elbow ADM 8.7  10.8  96.2 A.Elbow - B.Elbow 10 50 ?49 38.6  ?R Ulnar - ADM  ?   Wrist ADM 3.0 ?3.3 11.8 ?6.0 100 Wrist - ADM 7   47.6  ?   B.Elbow ADM 6.5  10.6  89.2 B.Elbow - Wrist 18 51 ?49 43.0  ?   A.Elbow ADM 8.4  11.0  104 A.Elbow - B.Elbow 10 52 ?49 44.8  ?           ?[place] ?   ?Nerve / Sites Rec. Site Peak Lat Ref.  Amp Ref. Segments Distance Peak Diff Ref.  ?  ms ms ?V ?V  cm ms ms  ?L Median, Ulnar - Transcarpal comparison  ?   Median Palm Wrist 2.0 ?2.2 107 ?35 Median Palm - Wrist 8    ?   Ulnar Palm Wrist 2.2 ?2.2 41 ?12 Ulnar Palm - Wrist 8    ?      Median Palm - Ulnar Palm  -0.2 ?0.4  ?R Median, Ulnar - Transcarpal comparison  ?   Median Palm Wrist 2.2 ?2.2 90 ?35 Median Palm - Wrist 8    ?   Ulnar Palm Wrist 2.2 ?2.2 23 ?12 Ulnar Palm - Wrist 8    ?      Median Palm - Ulnar Palm  -0.0 ?0.4  ?L Median - Orthodromic (Dig II, Mid palm)  ?   Dig II Wrist 3.0 ?3.4 39 ?10 Dig II - Wrist 13    ?R Median - Orthodromic (Dig II, Mid palm)  ?   Dig II Wrist 3.4 ?3.4 15 ?10 Dig II - Wrist 13    ?L Ulnar - Orthodromic, (Dig V, Mid palm)  ?   Dig V Wrist 3.0 ?3.1 13 ?5 Dig V - Wrist 11    ?R Ulnar - Orthodromic, (Dig V, Mid palm)  ?   Dig V Wrist 3.0 ?3.1 10 ?5 Dig V - Wrist 11    ?               ?F  Wave ?   ?Nerve F Lat Ref.  ? ms ms  ?L Ulnar - ADM 29.4 ?32.0  ?R Ulnar - ADM 30.3 ?32.0  ?       ?EMG Summary Table   ? Spontaneous MUAP Recruitment  ?Muscle IA Fib PSW Fasc Other Amp Dur. Poly Pattern  ?R. Deltoid Normal None None None _______ Normal Normal Normal Normal  ?R. Triceps brachii Normal None None None _______ Normal Normal Normal Normal  ?R. Biceps brachii Normal None None None _______ Normal Normal Normal Normal  ?R. Extensor digitorum communis Normal None None None _______ Normal Normal Normal Normal  ?R. First dorsal interosseous  Normal None None None _______ Normal Normal Normal Normal  ?R. Abductor pollicis brevis Normal None None None _______ Normal Normal  Normal Normal  ?R. Flexor pollicis longus Normal None None None _______ Normal Normal 1+ Reduced  ?R. Flexor digitorum profundus (Median) Normal None None None _______ Normal Normal 1+ Normal  ? ?  ?

## 2021-07-07 ENCOUNTER — Telehealth: Payer: Self-pay | Admitting: Neurology

## 2021-07-07 NOTE — Telephone Encounter (Signed)
Patient has an appointment with Dr. Skip Estimable with [facility] coming up.. Faxed the NCV/EMG results.  ?

## 2021-07-22 DIAGNOSIS — M25531 Pain in right wrist: Secondary | ICD-10-CM | POA: Diagnosis not present

## 2021-08-19 DIAGNOSIS — M25531 Pain in right wrist: Secondary | ICD-10-CM | POA: Diagnosis not present

## 2021-08-26 ENCOUNTER — Telehealth: Payer: Self-pay | Admitting: Neurology

## 2021-08-26 MED ORDER — METHYLPREDNISOLONE 4 MG PO TBPK: ORAL_TABLET | ORAL | 0 refills | Status: DC

## 2021-08-26 NOTE — Telephone Encounter (Signed)
Pt said Saturday got a bulging disc. Would like a call from the nurse to discuss coming in today for a shot.

## 2021-08-26 NOTE — Telephone Encounter (Signed)
Called pt back. Pt states he has a buldging disc that worsened this past Sat. Advised it would be best for him to see PCP first. They will help determine if he then needs to f/u with [place] after or go to another specialist. Pt then stated he has come in for steroid injections with Dr. Felecia Shelling in the past for many years. Requesting to come in to see MD for this. I spoke with MD. Madaline Brilliant to refer for ESI L4L5 like we have in the past. Pt concerned about going days w/o treatment. Wife also in [place] and does not have driver right now. He asked if MD would be ok with calling in oral steroids instead. Dr. Felecia Shelling approved this. I e-scribed to [facility] on file per pt request.

## 2021-09-05 DIAGNOSIS — G35 Multiple sclerosis: Secondary | ICD-10-CM | POA: Diagnosis not present

## 2021-09-05 DIAGNOSIS — M25531 Pain in right wrist: Secondary | ICD-10-CM | POA: Diagnosis not present

## 2021-09-09 DIAGNOSIS — M25531 Pain in right wrist: Secondary | ICD-10-CM | POA: Diagnosis not present

## 2021-09-10 ENCOUNTER — Encounter: Payer: Self-pay | Admitting: Neurology

## 2021-09-10 ENCOUNTER — Ambulatory Visit (INDEPENDENT_AMBULATORY_CARE_PROVIDER_SITE_OTHER): Payer: BC Managed Care – PPO | Admitting: Neurology

## 2021-09-10 ENCOUNTER — Telehealth: Payer: Self-pay | Admitting: Neurology

## 2021-09-10 VITALS — BP 126/77 | HR 57 | Ht 70.0 in | Wt 195.2 lb

## 2021-09-10 DIAGNOSIS — R208 Other disturbances of skin sensation: Secondary | ICD-10-CM | POA: Diagnosis not present

## 2021-09-10 DIAGNOSIS — G35 Multiple sclerosis: Secondary | ICD-10-CM | POA: Diagnosis not present

## 2021-09-10 DIAGNOSIS — M5412 Radiculopathy, cervical region: Secondary | ICD-10-CM

## 2021-09-10 NOTE — Progress Notes (Signed)
[facility]  PATIENT: Willie Lucas DOB: Aug 28, 1965  REFERRING DOCTOR OR PCP:  Benay Pillow SOURCE: Patient and records from [facility]  _________________________________   HISTORICAL  CHIEF COMPLAINT:  Chief Complaint  Patient presents with   Follow-up    RM 2, alone. Here to discuss MRI results/treatment plan.    HISTORY OF PRESENT ILLNESS: Mr. Huish is a 56 y.o.. man with relapsing remitting MS.     09/18/2020 He is continuing to have numbness in the hands and feet.   He wakes up with numbness in his right foot / toes but it improves quickly.   He also has some numbness .   He has right > left neck  He was experiencing pain predominantly in the right arm.  The pain has improved some compared to a couple months ago.  An NCV/EMG we did 06/18/2021 showed mild chronic denervation changes in some of the right anterior interosseous nerve innervated muscles that could have been due to a mild compartment type syndrome due to the overuse.  He occasionally notes when he bends his head to the left that he will get a jolt down the left arm sometimes into the first 2 fingers.  Due to his symptoms, he saw Dr. Apolonio Schneiders of orthopedics.  MRI of the brain and cervical spine was repeated.  The MRI of the brain showed just a handful of T2/FLAIR hypertense foci consistent with MS.  The MRI of the cervical spine showed a normal spinal cord and degenerative changes at C5-C6 towards the left causing moderately severe left foraminal narrowing.  Changes have mildly progressed at this level compared to 2021.  His MS has been stable.  He had been off Copaxone since 2012 and has no definite exacerbations.   He has had a few episodes of mild tingling but no significant symptom.  We have done a couple steroid packs in the past but no IV. His last MRI was 11/04/2016 at [facility].           He is walking well and stays active -- exercise, weights, running daily.    No issues with  balance, strnegth or fixed numbness (occ tingling).   Bladder is fine.    We did several ESI's for lumbar radiculopathy.  He saw Dr. Rolena Infante in 2019 for L5-S1 disc protrusion but MRI in 2019 actually looked better.   Surgery was not recommended as he had improved by then.  He has occasional migraine headaches.    Multiple sclerosis History: This woman was diagnosed with MS around 2000.  Initially he had several exacerbations with numbness and clumsiness and visual changes.[name] He was placed on Copaxone and did not have any further exacerbations. About 5 or 6 years ago he went to every other day and year or 2 later he stopped the Copaxone.   An MRI was performed 09/08/2012.  It was compared to one dated 01/31/2008 andthere was only one new lesion in that 5 year interval.  There were no acute lesions on either study.  After his diagnosis he began to exercise on a very regular basis and continues to do so. He denies any significant sequela from the MS. Specifically, there is no difficulty with his gait and he notes no numbness or weakness. He does not have bladder or visual dysfunction. His mood issues with anxiety are probably unrelated to the MS. He does not note much for fatigue and remains active and travels frequently.   He denies any cognitive dysfunction  and works as a Music therapist.  IMAGING: MRI of the cervical spine 09/05/2021 shows degenerative changes at C2-C3 and C5-C6.  At C5-C6 there is moderate to severe foraminal narrowing to the left due to a disc osteophyte complex that could affect the left C6 nerve root.  This is a little progressed compared to the 2021 MRI.  The spinal cord appears normal.  MRI of the brain 09/05/2021 shows a small number of T2/FLAIR hyperintense foci in the periventricular, juxtacortical and deep white matter.  None of these appear to be acute.  There are no new lesions compared to the 2021 MRI.    MRI brain 11/15/2019  Scattered T2/FLAIR hyperintense foci in the  hemispheres in a pattern and configuration consistent with chronic demyelinating plaque associated with multiple sclerosis.  None of the foci enhances or appears to be acute.  Compared to the MRI dated 03/13/2015, there are no new lesions.      There is a normal enhancement pattern and no acute findings.  MRI cervical spine 11/15/2019 showed a possible subtle T2 hyperintense focus to the right at C3-C4 seen on sagittal STIR images but not clearly seen on other images.  Normal spinal cord and mild DJD at C2-C3, C5C6.  No nerve root cmpression  MRI of the lumbar spine 02/27/2018 showed a disc protrusion towards the left at L4-L5 and a disc protrusion towards the right at L5-S1.  The L5-S1 disc protrusion actually has an improved appearance compared to the 2011 MRI.  There is no definite nerve root compression and no spinal stenosis.  REVIEW OF SYSTEMS: Constitutional: No fevers, chills, sweats, or change in appetite Eyes: No visual changes, double vision, eye pain Ear, nose and throat: No hearing loss, ear pain, nasal congestion, sore throat Cardiovascular: No chest pain, palpitations Respiratory:  No shortness of breath at rest or with exertion.   No wheezes GastrointestinaI: No nausea, vomiting, diarrhea, abdominal pain, fecal incontinence Genitourinary:  No dysuria, urinary retention or frequency.  No nocturia. Musculoskeletal:  See above  Integumentary: No rash, pruritus, skin lesions Neurological: as above Psychiatric: No depression at this time.  Notes anxiety Endocrine: No palpitations, diaphoresis, change in appetite, change in weigh or increased thirst Hematologic/Lymphatic:  No anemia, purpura, petechiae. Allergic/Immunologic: No itchy/runny eyes, nasal congestion, recent allergic reactions, rashes  ALLERGIES: No Known Allergies  HOME MEDICATIONS:  Current Outpatient Medications:    atorvastatin (LIPITOR) 20 MG tablet, Take 1 tablet (20 mg total) by mouth daily., Disp: 90 tablet,  Rfl: 1   clonazePAM (KLONOPIN) 0.5 MG tablet, Take 1-2 tablets (0.5-1 mg total) by mouth 2 (two) times daily as needed (spasm, back)., Disp: 20 tablet, Rfl: 1   omeprazole (PRILOSEC) 40 MG capsule, TAKE 1 CAPSULE BY MOUTH EVERY DAY.NEED APPOINTMENT FOR MORE REFILLS!, Disp: 30 capsule, Rfl: 0   rizatriptan (MAXALT) 5 MG tablet, TAKE 1 TABLET BY MOUTH AS NEEDED FOR HEADACHE. MAY REPEAT IN 2 HOURS IF NEEDED, Disp: 10 tablet, Rfl: 6   traMADol (ULTRAM) 50 MG tablet, Take one tablet by mouth three times a day as needed for pain, Disp: 30 tablet, Rfl: 0   triamcinolone cream (KENALOG) 0.1 %, Apply 1 application topically 2 (two) times daily. As needed for rash., Disp: 30 g, Rfl: 2 No current facility-administered medications for this visit.  Facility-Administered Medications Ordered in Other Visits:    gadopentetate dimeglumine (MAGNEVIST) injection 19 mL, 19 mL, Intravenous, Once PRN, Dhyan Noah, Nanine Means, MD  PAST MEDICAL HISTORY: Past Medical History:  Diagnosis Date  Anxiety    Benign fundic gland polyps of stomach    Headache(784.0)    Hyperlipidemia    Hypertension    MS (multiple sclerosis) (HCC)    Neuromuscular disorder (HCC)    Ulcer    hx of duodenal ulcer at age 80    PAST SURGICAL HISTORY: Past Surgical History:  Procedure Laterality Date   COLONOSCOPY     HEMORRHOID SURGERY  04/09/2021   RHINOPLASTY     X2    FAMILY HISTORY: Family History  Problem Relation Age of Onset   Arthritis Mother    Suicidality Father    Brain cancer Brother        died   Diabetes Maternal Grandmother    Colon cancer Neg Hx    Stomach cancer Neg Hx    Esophageal cancer Neg Hx    Pancreatic cancer Neg Hx     SOCIAL HISTORY:  Social History   Socioeconomic History   Marital status: Married    Spouse name: Not on file   Number of children: 2   Years of education: Not on file   Highest education level: Not on file  Occupational History   Occupation: Music therapist  Tobacco Use    Smoking status: Former    Packs/day: 0.25    Years: 10.00    Total pack years: 2.50    Types: Cigarettes    Quit date: 1998    Years since quitting: 25.4   Smokeless tobacco: Never  Vaping Use   Vaping Use: Never used  Substance and Sexual Activity   Alcohol use: Yes    Comment: occ   Drug use: No   Sexual activity: Yes    Partners: Female  Other Topics Concern   Not on file  Social History Narrative   Not on file   Social Determinants of Health   Financial Resource Strain: Not on file  Food Insecurity: Not on file  Transportation Needs: Not on file  Physical Activity: Not on file  Stress: Not on file  Social Connections: Not on file  Intimate Partner Violence: Not on file     PHYSICAL EXAM  Vitals:   09/10/21 1553  BP: 126/77  Pulse: (!) 57  Weight: 195 lb 3.2 oz (88.5 kg)  Height: '5\' 10"'$  (1.778 m)    Body mass index is 28.01 kg/m.   General: The patient is well-developed and well-nourished and in mild acute distress  Skin:  Extremities are without rash or edema.  Musculoskeletal: He has mild tenderness over the lower right lumbar paraspinals.  No piriformis tenderness.  No trochanteric bursa tenderness.   Neurologic Exam  Mental status: The patient is alert and oriented x 3 at the time of the examination. The patient has apparent normal recent and remote memory, with an apparently normal attention span and concentration ability.   Speech is normal.  Cranial nerves: Extraocular movements are full.  Facial strength is normal.  Hearing appears to be normal.    Motor: Muscle bulk, tone and strength was normal  Sensory: There were no Tinel signs.  He had intact sensation to touch temperature and vibration in the arms and legs.  Coordination: Cerebellar testing reveals good finger-nose-finger and heel-to-shin bilaterally.  Gait and station: Station is normal.   Gait is normal. Tandem gait is normal. Romberg is negative.   Reflexes: Deep tendon  reflexes are symmetric and normal bilaterally.    [name]    DIAGNOSTIC DATA (LABS, IMAGING, TESTING) - I reviewed patient  records, labs, notes, testing and imaging myself where available.  Lab Results  Component Value Date   WBC 6.4 04/30/2021   HGB 15.5 04/30/2021   HCT 45.9 04/30/2021   MCV 91.6 04/30/2021   PLT 124.0 (L) 04/30/2021      Component Value Date/Time   NA 140 04/30/2021 0904   K 4.8 04/30/2021 0904   CL 103 04/30/2021 0904   CO2 31 04/30/2021 0904   GLUCOSE 90 04/30/2021 0904   BUN 16 04/30/2021 0904   CREATININE 1.03 04/30/2021 0904   CALCIUM 9.2 04/30/2021 0904   PROT 6.5 04/30/2021 0904   ALBUMIN 4.3 04/30/2021 0904   AST 18 04/30/2021 0904   ALT 20 04/30/2021 0904   ALKPHOS 75 04/30/2021 0904   BILITOT 0.6 04/30/2021 0904   GFRNONAA >60 02/27/2018 1414   GFRAA >60 02/27/2018 1414   Lab Results  Component Value Date   CHOL 168 04/30/2021   HDL 48.70 04/30/2021   LDLCALC 97 04/30/2021   LDLDIRECT 147.0 09/09/2020   TRIG 109.0 04/30/2021   CHOLHDL 3 04/30/2021   Lab Results  Component Value Date   HGBA1C 5.2 04/30/2021   No results found for: "VITAMINB12" Lab Results  Component Value Date   TSH 1.11 02/28/2013     ASSESSMENT AND PLAN  Multiple sclerosis (HCC)  Cervical radiculopathy  Dysesthesia   1.   His MS is stable.  He has been off of disease modifying therapies since 2012.  MRIs have been stable.   2.  I reviewed the MRI of the brain and cervical spine in his presence.  The MRI of the brain showed 5-6 small T2/FLAIR hyperintense foci in a pattern consistent with MS.  The MRI of the spinal cord showed a normal spinal cord and degenerative changes most noted at C5-C6 to the left that could cause left C6 nerve root compression.  Those degenerative changes have mildly progressed compared to 2021.  We discussed that since this continues to bother him that he could obtain an epidural steroid injection and he will contact [facility]  where he has been seen 3.    Continue Maxalt for migraines.  He will return to see me in 6-12 months or sooner if he has new or worsening neurologic symptoms.   Bright Spielmann A. Felecia Shelling, MD, PhD 8/85/0277, 4:12 PM Certified in Neurology, Clinical Neurophysiology, Sleep Medicine, Pain Medicine and Neuroimaging  [facility] [address], [place], [place] [address] [phone]

## 2021-09-10 NOTE — Telephone Encounter (Signed)
Pt asking if can drop off copy of MRI and results. Also would like for Dr. Felecia Shelling to read the MRI. Would like a call from the nurse.

## 2021-09-10 NOTE — Telephone Encounter (Signed)
Called pt back. Oral steroids helped back pain along with rest.   Thinks he may need wrist surgery though. Wrist surgeon at [facility] ordered MRI brain/cervical. He completed on 09/05/2021. Told there are new developments. He wants to discuss with Dr. Berneda Rose him review. He preferred to come in for appt. Scheduled appt for 09/10/21 at 4pm. He will bring report/CD's with him.

## 2021-09-18 ENCOUNTER — Ambulatory Visit: Payer: BC Managed Care – PPO | Admitting: Neurology

## 2021-09-25 DIAGNOSIS — M79674 Pain in right toe(s): Secondary | ICD-10-CM | POA: Diagnosis not present

## 2021-10-06 DIAGNOSIS — M5412 Radiculopathy, cervical region: Secondary | ICD-10-CM | POA: Diagnosis not present

## 2021-10-27 DIAGNOSIS — M5412 Radiculopathy, cervical region: Secondary | ICD-10-CM | POA: Diagnosis not present

## 2021-10-29 ENCOUNTER — Encounter: Payer: Self-pay | Admitting: Family Medicine

## 2021-10-29 ENCOUNTER — Ambulatory Visit (INDEPENDENT_AMBULATORY_CARE_PROVIDER_SITE_OTHER): Payer: BC Managed Care – PPO | Admitting: Family Medicine

## 2021-10-29 VITALS — BP 128/78 | HR 60 | Temp 98.1°F | Resp 16 | Ht 70.0 in | Wt 194.8 lb

## 2021-10-29 DIAGNOSIS — E785 Hyperlipidemia, unspecified: Secondary | ICD-10-CM | POA: Diagnosis not present

## 2021-10-29 DIAGNOSIS — R55 Syncope and collapse: Secondary | ICD-10-CM | POA: Diagnosis not present

## 2021-10-29 DIAGNOSIS — Z8669 Personal history of other diseases of the nervous system and sense organs: Secondary | ICD-10-CM | POA: Diagnosis not present

## 2021-10-29 DIAGNOSIS — L503 Dermatographic urticaria: Secondary | ICD-10-CM

## 2021-10-29 LAB — COMPREHENSIVE METABOLIC PANEL
ALT: 19 U/L (ref 0–53)
AST: 20 U/L (ref 0–37)
Albumin: 4.5 g/dL (ref 3.5–5.2)
Alkaline Phosphatase: 66 U/L (ref 39–117)
BUN: 15 mg/dL (ref 6–23)
CO2: 27 mEq/L (ref 19–32)
Calcium: 9.1 mg/dL (ref 8.4–10.5)
Chloride: 102 mEq/L (ref 96–112)
Creatinine, Ser: 0.98 mg/dL (ref 0.40–1.50)
GFR: 87.31 mL/min (ref >60.00)
Glucose, Bld: 87 mg/dL (ref 70–99)
Potassium: 4.6 mEq/L (ref 3.5–5.1)
Sodium: 139 mEq/L (ref 135–145)
Total Bilirubin: 0.4 mg/dL (ref 0.2–1.2)
Total Protein: 6.7 g/dL (ref 6.0–8.3)

## 2021-10-29 LAB — LIPID PANEL
Cholesterol: 192 mg/dL (ref 0–200)
HDL: 57.8 mg/dL (ref >39.00)
LDL Cholesterol: 112 mg/dL — ABNORMAL HIGH (ref 0–99)
NonHDL: 134.25
Total CHOL/HDL Ratio: 3
Triglycerides: 113 mg/dL (ref 0.0–149.0)
VLDL: 22.6 mg/dL (ref 0.0–40.0)

## 2021-10-29 MED ORDER — TRIAMCINOLONE ACETONIDE 0.1 % EX CREA: 1.0000 | TOPICAL_CREAM | Freq: Two times a day (BID) | CUTANEOUS | 2 refills | Status: AC

## 2021-10-29 MED ORDER — ATORVASTATIN CALCIUM 20 MG PO TABS: 20.0000 mg | ORAL_TABLET | Freq: Every day | ORAL | 1 refills | Status: DC

## 2021-10-29 NOTE — Progress Notes (Signed)
Subjective:  Patient ID: Willie Lucas, male    DOB: 10-31-1965  Age: 56 y.o. MRN: [id number]  CC:  Chief Complaint  Patient presents with   Hyperlipidemia    HPI Sagar Tengan presents for follow-up, he did have an annual exam on February 8.  Multiple sclerosis, treated by neurology, Dr. Felecia Shelling.  Has also been under his care as well as Dr.Ramos for cervical radiculopathy, prior trigger point injections.  Previously on disease-modifying therapy through 05/12/2010. Maxalt for migraines, also under care of neuro.  Has used Klonopin as needed for muscle spasms and back. Had injection 2 days ago - Dr. Nelva Bush. Recent imaging for MS was reassuring - brain and neck with [facility], discussed with Dr. Felecia Shelling - he was reasurred.  Maxalt 1-2 per month - works well.   History of vasovagal episodes, evaluated by cardiology, appeared to have been related to rectal pain.  Similar episode few weeks ago with rectal pain/pressure symptoms - no injury. 4 total episodes. Recognizes onset of symptoms, and able to stop driving iif needed as preceding symptoms,  and 911 precautions if needed.  Hydration discussed.  No current tramadol.  No recent need for klonopin - has if needed.   History of dermatographism, infrequent rashes, triamcinolone if needed for flares. Still occasional flare in thigh, groin, perianal rash or stomach. Doing ok now - no current rash. Ongoing care with derm.   Hyperlipidemia: Lipitor 20 mg daily. No new myalgias or side effects known. Some various aches - has not tried drug holiday.  No current issues.   Lab Results  Component Value Date   CHOL 168 04/30/2021   HDL 48.70 04/30/2021   LDLCALC 97 04/30/2021   LDLDIRECT 147.0 09/09/2020   TRIG 109.0 04/30/2021   CHOLHDL 3 04/30/2021   Lab Results  Component Value Date   ALT 20 04/30/2021   AST 18 04/30/2021   ALKPHOS 75 04/30/2021   BILITOT 0.6 04/30/2021          10/29/2021    9:47 AM 04/30/2021    8:13 AM 12/25/2020     8:39 AM 10/21/2020    1:52 PM 09/09/2020    8:50 AM  Depression screen PHQ 2/9  Decreased Interest 0 0 0 0 0  Down, Depressed, Hopeless 0 0 0 0 0  PHQ - 2 Score 0 0 0 0 0  Altered sleeping 0  0  0  Tired, decreased energy 0  0  0  Change in appetite 0  0  0  Feeling bad or failure about yourself  0  0  0  Trouble concentrating 0  0  0  Moving slowly or fidgety/restless 0  0  0  Suicidal thoughts 0  0  0  PHQ-9 Score 0  0  0    Health Maintenance  Topic Date Due   INFLUENZA VACCINE  10/21/2021   COVID-19 Vaccine (5 - Moderna series) 11/14/2021 (Originally 09/22/2020)   COLONOSCOPY (Pts 45-49yr Insurance coverage will need to be confirmed)  03/31/2028   TETANUS/TDAP  09/10/2030   Hepatitis C Screening  Completed   HIV Screening  Completed   Zoster Vaccines- Shingrix  Completed   HPV VACCINES  Aged Out     History Patient Active Problem List   Diagnosis Date Noted   Cervical radiculopathy 09/10/2021   Right arm weakness 06/18/2021   Right arm pain 06/18/2021   Syncope and collapse 01/20/2021   Bradycardia 01/20/2021   Acute right-sided low back pain without  sciatica 09/18/2020   Acute diarrhea 12/06/2019   Generalized abdominal pain 12/06/2019   Irritable bowel syndrome with diarrhea 12/06/2019   Numbness 05/12/2018   Paresthesias 11/25/2016   Common migraine without intractability 07/04/2014   Exercise-induced reactive airway disease 04/24/2014   Pure hypercholesterolemia 03/08/2013   SCIATICA 07/16/2009   MUSCLE SPASM, LUMBOSACRAL REGION 07/16/2009   Lumbar radicular pain 07/16/2009   URI 04/30/2009   MULTIPLE SCLEROSIS 03/28/2009   ACUT PEPTC ULCR UNS SITE W/HEM W/O MENTION OBST 03/28/2009   HEADACHE 03/28/2009   Past Medical History:  Diagnosis Date   Anxiety    Benign fundic gland polyps of stomach    Headache(784.0)    Hyperlipidemia    Hypertension    MS (multiple sclerosis) (HCC)    Neuromuscular disorder (HCC)    Ulcer    hx of duodenal ulcer at age  38   Past Surgical History:  Procedure Laterality Date   COLONOSCOPY     HEMORRHOID SURGERY  04/09/2021   RHINOPLASTY     X2   No Known Allergies Prior to Admission medications   Medication Sig Start Date End Date Taking? Authorizing Provider  atorvastatin (LIPITOR) 20 MG tablet Take 1 tablet (20 mg total) by mouth daily. 04/30/21  Yes Wendie Agreste, MD  clonazePAM (KLONOPIN) 0.5 MG tablet Take 1-2 tablets (0.5-1 mg total) by mouth 2 (two) times daily as needed (spasm, back). 04/30/21  Yes Wendie Agreste, MD  omeprazole (PRILOSEC) 40 MG capsule TAKE 1 CAPSULE BY MOUTH EVERY DAY.NEED APPOINTMENT FOR MORE REFILLS! 07/06/18  Yes Ladene Artist, MD  rizatriptan (MAXALT) 5 MG tablet TAKE 1 TABLET BY MOUTH AS NEEDED FOR HEADACHE. MAY REPEAT IN 2 HOURS IF NEEDED 02/11/21  Yes Sater, Nanine Means, MD  traMADol (ULTRAM) 50 MG tablet Take one tablet by mouth three times a day as needed for pain 04/23/21  Yes Ladene Artist, MD  triamcinolone cream (KENALOG) 0.1 % Apply 1 application topically 2 (two) times daily. As needed for rash. 04/30/21  Yes Wendie Agreste, MD   Social History   Socioeconomic History   Marital status: Married    Spouse name: Not on file   Number of children: 2   Years of education: Not on file   Highest education level: Not on file  Occupational History   Occupation: Music therapist  Tobacco Use   Smoking status: Former    Packs/day: 0.25    Years: 10.00    Total pack years: 2.50    Types: Cigarettes    Quit date: 1998    Years since quitting: 25.6   Smokeless tobacco: Never  Vaping Use   Vaping Use: Never used  Substance and Sexual Activity   Alcohol use: Yes    Comment: occ   Drug use: No   Sexual activity: Yes    Partners: Female  Other Topics Concern   Not on file  Social History Narrative   Not on file   Social Determinants of Health   Financial Resource Strain: Not on file  Food Insecurity: Not on file  Transportation Needs: Not on file   Physical Activity: Not on file  Stress: Not on file  Social Connections: Not on file  Intimate Partner Violence: Not on file    Review of Systems  Constitutional:  Negative for fatigue and unexpected weight change.  Eyes:  Negative for visual disturbance.  Respiratory:  Negative for cough, chest tightness and shortness of breath.   Cardiovascular:  Negative  for chest pain, palpitations and leg swelling.  Gastrointestinal:  Negative for abdominal pain and blood in stool.  Neurological:  Negative for dizziness, light-headedness and headaches.     Objective:   Vitals:   10/29/21 0947  BP: 128/78  Pulse: 60  Resp: 16  Temp: 98.1 F (36.7 C)  TempSrc: Oral  SpO2: 98%  Weight: 194 lb 12.8 oz (88.4 kg)  Height: '5\' 10"'$  (1.778 m)     Physical Exam Vitals reviewed.  Constitutional:      Appearance: He is well-developed.  HENT:     Head: Normocephalic and atraumatic.  Neck:     Vascular: No carotid bruit or JVD.  Cardiovascular:     Rate and Rhythm: Normal rate and regular rhythm.     Heart sounds: Normal heart sounds. No murmur heard. Pulmonary:     Effort: Pulmonary effort is normal.     Breath sounds: Normal breath sounds. No rales.  Musculoskeletal:     Right lower leg: No edema.     Left lower leg: No edema.  Skin:    General: Skin is warm and dry.  Neurological:     General: No focal deficit present.     Mental Status: He is alert and oriented to person, place, and time. Mental status is at baseline.     Gait: Gait normal.  Psychiatric:        Mood and Affect: Mood normal.        Assessment & Plan:  Nial Hawe is a 56 y.o. male . Hyperlipidemia, unspecified hyperlipidemia type - Plan: atorvastatin (LIPITOR) 20 MG tablet, Lipid panel, Comprehensive metabolic panel  -  Stable, tolerating current regimen. Medications refilled. Labs pending as above.  Episodic arthralgias are likely unrelated to statin use.  Treated by Ortho, physical medicine and rehab as  above.  Denies current myalgias.  Option of temporary drug holiday from statin for a few weeks in the future to see if specific myalgia/arthralgia related but unlikely.  Continue same dose of Lipitor for now, check labs.   Dermatographism - Plan: triamcinolone cream (KENALOG) 0.1 %  -With intermittent rash, followed by dermatology, triamcinolone cream effective previously, refilled.  Continue dermatology follow-up.  Vaso vagal episode  -1 episode few weeks ago as above, similar trigger with need to defecate, rectal fullness/discomfort.  Has been evaluated by gastroenterology, cardiology, neurology.  He is able to recognize symptoms early and place himself in a safer situation in case he does have a syncopal episode.  Deferred further workup or treatments at this time given infrequent nature but if there is increase would recommend discussing further with cardiology, neurology.  Continue to maintain hydration as may also be a trigger for headaches.  History of migraine  -Has Maxalt, continue hydration and follow-up with neurology as above.  Meds ordered this encounter  Medications   atorvastatin (LIPITOR) 20 MG tablet    Sig: Take 1 tablet (20 mg total) by mouth daily.    Dispense:  90 tablet    Refill:  1   triamcinolone cream (KENALOG) 0.1 %    Sig: Apply 1 Application topically 2 (two) times daily. As needed for rash.    Dispense:  30 g    Refill:  2   Patient Instructions  You can try holiday off statin for 2 weeks if you have any muscle aches to see if that is the cause, but unlikely. No med changes for now.  Continue to monitor for symptoms of vasovagal flares and  discuss other treatment options if needed with Dr. Felecia Shelling. If occurring more frequently, let me know.      Signed,   Merri Ray, MD [place], [facility] 10/29/21 10:43 AM

## 2021-10-29 NOTE — Patient Instructions (Addendum)
You can try holiday off statin for 2 weeks if you have any muscle aches to see if that is the cause, but unlikely. No med changes for now.  Continue to monitor for symptoms of vasovagal flares and discuss other treatment options if needed with Dr. Felecia Shelling. If occurring more frequently, let me know.

## 2022-02-10 DIAGNOSIS — M5412 Radiculopathy, cervical region: Secondary | ICD-10-CM | POA: Diagnosis not present

## 2022-02-10 DIAGNOSIS — M545 Low back pain, unspecified: Secondary | ICD-10-CM | POA: Diagnosis not present

## 2022-03-02 DIAGNOSIS — M5412 Radiculopathy, cervical region: Secondary | ICD-10-CM | POA: Diagnosis not present

## 2022-03-12 DIAGNOSIS — M5412 Radiculopathy, cervical region: Secondary | ICD-10-CM | POA: Diagnosis not present

## 2022-04-20 ENCOUNTER — Telehealth: Payer: Self-pay | Admitting: Gastroenterology

## 2022-04-20 NOTE — Telephone Encounter (Signed)
Given the severity of the pain he may have a thrombosed hemorrhoid or a fissure. Recommend urgent surgical referral or ED for prompt evaluation

## 2022-04-20 NOTE — Telephone Encounter (Signed)
The pt states that over the weekend he developed painful internal hemorrhoids. He does have a history of internal hemorrhoids. He says that it can become so painful that he passes out and "find myself on the floor".  He has been using recticare, tramadol, naproxyn, and sitzbaths.  He has an appt with Dr Fuller Plan on 2/13 but would like to know if there is anything that can be prescribed in the meantime.  He does note that his bowels are normal and soft-no constipation issues.   He is aware that Dr Fuller Plan is out of the office today and will be contacted as soon as we get a response.

## 2022-04-20 NOTE — Telephone Encounter (Signed)
Patient is returning your call

## 2022-04-20 NOTE — Telephone Encounter (Signed)
Patient called to follow up  Please advise

## 2022-04-20 NOTE — Telephone Encounter (Signed)
I spoke with the pt and advised him of the recommendations per Dr Fuller Plan.  He declines the urgent referral to [facility] and states that the pain has resolved at this point. He says he will keep appt with Dr Fuller Plan and if the pain returns he will go to the ED. He will continue recticare, and sitz baths and well as naproxyn and tramadol.    FYI Dr Fuller Plan

## 2022-04-20 NOTE — Telephone Encounter (Signed)
Patient called requesting an appointment for this week with Dr. Fuller Plan. Patient states he had a hemorrhoid flare up so intense that it caused him to pass out. Patient is requesting to speak to a nurse to further advise.

## 2022-04-20 NOTE — Telephone Encounter (Signed)
Left message on machine to call back  

## 2022-05-05 ENCOUNTER — Encounter: Payer: Self-pay | Admitting: Gastroenterology

## 2022-05-05 ENCOUNTER — Ambulatory Visit (INDEPENDENT_AMBULATORY_CARE_PROVIDER_SITE_OTHER): Payer: No Typology Code available for payment source | Admitting: Gastroenterology

## 2022-05-05 VITALS — BP 122/84 | HR 79 | Ht 70.5 in | Wt 195.1 lb

## 2022-05-05 DIAGNOSIS — Z86010 Personal history of colonic polyps: Secondary | ICD-10-CM | POA: Diagnosis not present

## 2022-05-05 DIAGNOSIS — K641 Second degree hemorrhoids: Secondary | ICD-10-CM

## 2022-05-05 DIAGNOSIS — K6289 Other specified diseases of anus and rectum: Secondary | ICD-10-CM

## 2022-05-05 MED ORDER — HYDROCORTISONE ACETATE 25 MG RE SUPP: 25.0000 mg | Freq: Every day | RECTAL | 5 refills | Status: DC

## 2022-05-05 NOTE — Patient Instructions (Signed)
We have sent the following medications to your pharmacy for you to pick up at your convenience: Anusol suppositories daily at bedtime x 14 days every day then as needed.   If too expensive then purchase over the counter hydrocortisone cream and coat the preparation H suppositories to use daily.   Continue recti-care cream daily.   RECTAL CARE INSTRUCTIONS:  1. Sitz Baths twice a day for 10 minutes each. 2. Thoroughly clean and dry the rectum. 3. Put Tucks pad against the rectum at night. 4. Clean the rectum with Balenol lotion after each bowel movement.   You can also you witch hazel for rectal itching.   The [facility] providers would like to encourage you to use [facility] to communicate with providers for non-urgent requests or questions.  Due to long hold times on the telephone, sending your provider a message by [facility] may be a faster and more efficient way to get a response.  Please allow 48 business hours for a response.  Please remember that this is for non-urgent requests.   Thank you for choosing me and [facility].  Pricilla Riffle. Dagoberto Ligas., MD., Marval Regal

## 2022-05-05 NOTE — Progress Notes (Signed)
    Assessment     Proctalgia fugax, possibly exacerbated by hemorrhoid symptoms Grade II internal hemorrhoids. RP banded in January 2023 with severe pain following banding Personal history of adenomatous colon polyps   Recommendations    Anusol 2.5% HC supp PR hs for 14 days, then PR hs prn Follow rectal care instructions RectiCare topically bid prn Surveillance colonoscopy due in January 2030 REV for consideration of anoscopy and banding of RA, LL in 1 month. He is aware of the potential risk of post banding pain   HPI    This is a 57 year old male with frequent hemorrhoid symptoms and intermittent severe rectal pain.  He relates frequent itching and soreness occurring several times per week.  He has been using RectiCare for control of symptoms.  He has not been using any hemorrhoidal specific medications creams or suppositories.  He relates intermittent since abrupt onset severe rectal pain that often occurs at night and has led to syncopal episodes on 5 occasions over the past 2 years.  He feels the severe pain may be triggered by active hemorrhoid symptoms.  He relates he developed a swollen tender hemorrhoid about 2 weeks ago and had an episode of severe rectal pain associated with syncope.  When he notes presyncopal symptoms he has been laying down on the floor prior to syncope occurring.  He underwent hemorrhoidal banding of the RP in January 2023 and then had an episode of severe rectal pain in the middle of the night following his banding. He understandably elected not proceed with additional banding at that time.   Labs / Imaging       Latest Ref Rng & Units 10/29/2021   10:45 AM 04/30/2021    9:04 AM 10/21/2020    2:19 PM  Hepatic Function  Total Protein 6.0 - 8.3 g/dL 6.7  6.5  6.6   Albumin 3.5 - 5.2 g/dL 4.5  4.3  4.5   AST 0 - 37 U/L '20  18  18   '$ ALT 0 - 53 U/L '19  20  16   '$ Alk Phosphatase 39 - 117 U/L 66  75  79   Total Bilirubin 0.2 - 1.2 mg/dL 0.4  0.6  0.7         Latest Ref Rng & Units 04/30/2021    9:04 AM 12/25/2020    9:26 AM 02/27/2018    2:29 PM  CBC  WBC 4.0 - 10.5 K/uL 6.4  4.8    Hemoglobin 13.0 - 17.0 g/dL 15.5  15.3  16.3   Hematocrit 39.0 - 52.0 % 45.9  46.2  48.0   Platelets 150.0 - 400.0 K/uL 124.0  129.0     Current Medications, Allergies, Past Medical History, Past Surgical History, Family History and Social History were reviewed in [employer] record.   Physical Exam: General: Well developed, well nourished, no acute distress Head: Normocephalic and atraumatic Eyes: Sclerae anicteric, EOMI Ears: Normal auditory acuity Rectal: Deferred to next visit Mouth: No deformities or lesions noted Psychological:  Alert and cooperative. Normal mood and affect   Marla Pouliot T. Fuller Plan, MD 05/05/2022, 3:27 PM

## 2022-05-07 ENCOUNTER — Encounter: Payer: Self-pay | Admitting: Family Medicine

## 2022-05-07 ENCOUNTER — Ambulatory Visit: Payer: No Typology Code available for payment source | Admitting: Family Medicine

## 2022-05-07 VITALS — BP 128/72 | HR 66 | Temp 98.3°F | Ht 70.0 in | Wt 193.0 lb

## 2022-05-07 DIAGNOSIS — Z131 Encounter for screening for diabetes mellitus: Secondary | ICD-10-CM

## 2022-05-07 DIAGNOSIS — E785 Hyperlipidemia, unspecified: Secondary | ICD-10-CM

## 2022-05-07 DIAGNOSIS — Z130 Encounter for screening for diseases of the blood and blood-forming organs and certain disorders involving the immune mechanism: Secondary | ICD-10-CM | POA: Diagnosis not present

## 2022-05-07 DIAGNOSIS — Z0000 Encounter for general adult medical examination without abnormal findings: Secondary | ICD-10-CM | POA: Diagnosis not present

## 2022-05-07 DIAGNOSIS — Z125 Encounter for screening for malignant neoplasm of prostate: Secondary | ICD-10-CM

## 2022-05-07 LAB — HEMOGLOBIN A1C: Hgb A1c MFr Bld: 5.2 % (ref 4.6–6.5)

## 2022-05-07 LAB — COMPREHENSIVE METABOLIC PANEL
ALT: 22 U/L (ref 0–53)
AST: 22 U/L (ref 0–37)
Albumin: 4.7 g/dL (ref 3.5–5.2)
Alkaline Phosphatase: 73 U/L (ref 39–117)
BUN: 11 mg/dL (ref 6–23)
CO2: 29 mEq/L (ref 19–32)
Calcium: 9.4 mg/dL (ref 8.4–10.5)
Chloride: 102 mEq/L (ref 96–112)
Creatinine, Ser: 1 mg/dL (ref 0.40–1.50)
GFR: 84.91 mL/min (ref >60.00)
Glucose, Bld: 87 mg/dL (ref 70–99)
Potassium: 4.7 mEq/L (ref 3.5–5.1)
Sodium: 139 mEq/L (ref 135–145)
Total Bilirubin: 0.7 mg/dL (ref 0.2–1.2)
Total Protein: 6.7 g/dL (ref 6.0–8.3)

## 2022-05-07 LAB — CBC WITH DIFFERENTIAL/PLATELET
Basophils Absolute: 0.1 10*3/uL (ref 0.0–0.1)
Basophils Relative: 1 % (ref 0.0–3.0)
Eosinophils Absolute: 0.2 10*3/uL (ref 0.0–0.7)
Eosinophils Relative: 3.4 % (ref 0.0–5.0)
HCT: 47.9 % (ref 39.0–52.0)
Hemoglobin: 16.4 g/dL (ref 13.0–17.0)
Lymphocytes Relative: 32.4 % (ref 12.0–46.0)
Lymphs Abs: 1.8 10*3/uL (ref 0.7–4.0)
MCHC: 34.2 g/dL (ref 30.0–36.0)
MCV: 92.3 fl (ref 78.0–100.0)
Monocytes Absolute: 0.5 10*3/uL (ref 0.1–1.0)
Monocytes Relative: 8.1 % (ref 3.0–12.0)
Neutro Abs: 3.1 10*3/uL (ref 1.4–7.7)
Neutrophils Relative %: 55.1 % (ref 43.0–77.0)
Platelets: 146 10*3/uL — ABNORMAL LOW (ref 150.0–400.0)
RBC: 5.19 Mil/uL (ref 4.22–5.81)
RDW: 12.7 % (ref 11.5–15.5)
WBC: 5.6 10*3/uL (ref 4.0–10.5)

## 2022-05-07 LAB — LIPID PANEL
Cholesterol: 206 mg/dL — ABNORMAL HIGH (ref 0–200)
HDL: 57.3 mg/dL (ref >39.00)
LDL Cholesterol: 132 mg/dL — ABNORMAL HIGH (ref 0–99)
NonHDL: 148.25
Total CHOL/HDL Ratio: 4
Triglycerides: 81 mg/dL (ref 0.0–149.0)
VLDL: 16.2 mg/dL (ref 0.0–40.0)

## 2022-05-07 LAB — PSA: PSA: 0.55 ng/mL (ref 0.10–4.00)

## 2022-05-07 MED ORDER — ATORVASTATIN CALCIUM 20 MG PO TABS: 20.0000 mg | ORAL_TABLET | Freq: Every day | ORAL | 1 refills | Status: DC

## 2022-05-07 NOTE — Progress Notes (Signed)
Subjective:  Patient ID: Willie Lucas, male    DOB: 08-17-1965  Age: 57 y.o. MRN: [id number]  CC:  Chief Complaint  Patient presents with   Annual Exam    Pt is fasting, no chest pains, pt does have a question about naproxen use     HPI [name] presents for Annual Exam Doing well.   Neurology, Dr. Felecia Shelling, history of multiple sclerosis.  Disease-modifying therapy through 2012.  Maxalt for migraines.  Few per month.  Reported stable imaging of brain and neck last year. Physical medicine and rehab, Dr. Nelva Bush, cervical radiculopathy, prior trigger point injections. Still followed by [facility] for neck, s/p ESI, PT, working on posture. Hx of DDD. Discussed short term nsaid only.   History of vasovagal episodes, previous evaluation by cardiology, appear to be related to rectal pain.  Able to recognize onset of symptoms and manages with hydration. Recent episode. Saw GI - Dr. Fuller Plan. No specific cure, but managing hemorrhoid prevention.   History of dermographism with infrequent rash, triamcinolone if needed for flares.  Followed by dermatology. Uses cream every other month or so. Working well.   Hyperlipidemia: Lipitor 20 mg daily, option of drug holiday in the past for myalgias if needed. Daily use - no recent myalgias.  Lab Results  Component Value Date   CHOL 192 10/29/2021   HDL 57.80 10/29/2021   LDLCALC 112 (H) 10/29/2021   LDLDIRECT 147.0 09/09/2020   TRIG 113.0 10/29/2021   CHOLHDL 3 10/29/2021   Lab Results  Component Value Date   ALT 19 10/29/2021   AST 20 10/29/2021   ALKPHOS 66 10/29/2021   BILITOT 0.4 10/29/2021      05/07/2022    9:36 AM 10/29/2021    9:47 AM 04/30/2021    8:13 AM 12/25/2020    8:39 AM 10/21/2020    1:52 PM  Depression screen PHQ 2/9  Decreased Interest 0 0 0 0 0  Down, Depressed, Hopeless 0 0 0 0 0  PHQ - 2 Score 0 0 0 0 0  Altered sleeping  0  0   Tired, decreased energy  0  0   Change in appetite  0  0   Feeling bad or failure about  yourself   0  0   Trouble concentrating  0  0   Moving slowly or fidgety/restless  0  0   Suicidal thoughts  0  0   PHQ-9 Score  0  0     Health Maintenance  Topic Date Due   COVID-19 Vaccine (5 - 2023-24 season) 05/23/2022 (Originally 11/21/2021)   INFLUENZA VACCINE  06/21/2022 (Originally 10/21/2021)   COLONOSCOPY (Pts 45-60yr Insurance coverage will need to be confirmed)  03/31/2028   DTaP/Tdap/Td (2 - Td or Tdap) 09/10/2030   Hepatitis C Screening  Completed   HIV Screening  Completed   Zoster Vaccines- Shingrix  Completed   HPV VACCINES  Aged Out  Colonoscopy 2023 - repeat 7 yrs.  Prostate: does not have family history of prostate cancer. The natural history of prostate cancer and ongoing controversy regarding screening and potential treatment outcomes of prostate cancer has been discussed with the patient. The meaning of a false positive PSA and a false negative PSA has been discussed. He indicates understanding of the limitations of this screening test and wishes to proceed with screening PSA testing. No results found for: "PSA1", "PSA"  Ongoing derm eval yearly, no skin CA.   Immunization History  Administered Date(s) Administered  Influenza,inj,Quad PF,6+ Mos 12/25/2020   Moderna Sars-Covid-2 Vaccination 06/10/2019, 07/17/2019, 01/22/2020, 07/28/2020   Tdap 09/09/2020   Zoster Recombinat (Shingrix) 09/09/2020, 12/25/2020  Covid booster earlier last year.  Flu vaccine today.   No results found. Wears glasses. Optho eval last week.   Dental: about every 6 months usually.    Alcohol: 2-3 per week.   Tobacco: none.  Exercise: pickleball few hours per week. No CP/dyspnea with exercise.    History Patient Active Problem List   Diagnosis Date Noted   Cervical radiculopathy 09/10/2021   Right arm weakness 06/18/2021   Right arm pain 06/18/2021   Syncope and collapse 01/20/2021   Bradycardia 01/20/2021   Acute right-sided low back pain without sciatica 09/18/2020    Acute diarrhea 12/06/2019   Generalized abdominal pain 12/06/2019   Irritable bowel syndrome with diarrhea 12/06/2019   Numbness 05/12/2018   Paresthesias 11/25/2016   Common migraine without intractability 07/04/2014   Exercise-induced reactive airway disease 04/24/2014   Pure hypercholesterolemia 03/08/2013   SCIATICA 07/16/2009   MUSCLE SPASM, LUMBOSACRAL REGION 07/16/2009   Lumbar radicular pain 07/16/2009   URI 04/30/2009   MULTIPLE SCLEROSIS 03/28/2009   ACUT PEPTC ULCR UNS SITE W/HEM W/O MENTION OBST 03/28/2009   HEADACHE 03/28/2009   Past Medical History:  Diagnosis Date   Anxiety    Benign fundic gland polyps of stomach    Headache(784.0)    Hyperlipidemia    Hypertension    MS (multiple sclerosis) (HCC)    Neuromuscular disorder ([place])    Ulcer    hx of duodenal ulcer at age 77   Past Surgical History:  Procedure Laterality Date   COLONOSCOPY     HEMORRHOID SURGERY  04/09/2021   RHINOPLASTY     X2   No Known Allergies Prior to Admission medications   Medication Sig Start Date End Date Taking? Authorizing Provider  atorvastatin (LIPITOR) 20 MG tablet Take 1 tablet (20 mg total) by mouth daily. 10/29/21  Yes Wendie Agreste, MD  clonazePAM (KLONOPIN) 0.5 MG tablet Take 1-2 tablets (0.5-1 mg total) by mouth 2 (two) times daily as needed (spasm, back). 04/30/21  Yes Wendie Agreste, MD  hydrocortisone (ANUSOL-HC) 25 MG suppository Place 1 suppository (25 mg total) rectally at bedtime. 05/05/22  Yes Ladene Artist, MD  omeprazole (PRILOSEC) 40 MG capsule TAKE 1 CAPSULE BY MOUTH EVERY DAY.NEED APPOINTMENT FOR MORE REFILLS! 07/06/18  Yes Ladene Artist, MD  rizatriptan (MAXALT) 5 MG tablet TAKE 1 TABLET BY MOUTH AS NEEDED FOR HEADACHE. MAY REPEAT IN 2 HOURS IF NEEDED 02/11/21  Yes Sater, Nanine Means, MD  traMADol (ULTRAM) 50 MG tablet Take one tablet by mouth three times a day as needed for pain 04/23/21  Yes Ladene Artist, MD  triamcinolone cream (KENALOG) 0.1 % Apply  1 Application topically 2 (two) times daily. As needed for rash. 10/29/21  Yes Wendie Agreste, MD   Social History   Socioeconomic History   Marital status: Married    Spouse name: Not on file   Number of children: 2   Years of education: Not on file   Highest education level: Not on file  Occupational History   Occupation: Music therapist  Tobacco Use   Smoking status: Former    Packs/day: 0.25    Years: 10.00    Total pack years: 2.50    Types: Cigarettes    Quit date: 1998    Years since quitting: 26.1   Smokeless tobacco: Never  Vaping  Use   Vaping Use: Never used  Substance and Sexual Activity   Alcohol use: Yes    Comment: occ   Drug use: No   Sexual activity: Yes    Partners: Female  Other Topics Concern   Not on file  Social History Narrative   Not on file   Social Determinants of Health   Financial Resource Strain: Not on file  Food Insecurity: Not on file  Transportation Needs: Not on file  Physical Activity: Not on file  Stress: Not on file  Social Connections: Not on file  Intimate Partner Violence: Not on file    Review of Systems 13 point review of systems per patient health survey noted.  Negative other than as indicated above or in HPI.   Objective:   Vitals:   05/07/22 0940  BP: 128/72  Pulse: 66  Temp: 98.3 F (36.8 C)  TempSrc: Temporal  SpO2: 99%  Weight: 193 lb (87.5 kg)  Height: 5' 10"$  (1.778 m)     Physical Exam Vitals reviewed.  Constitutional:      Appearance: He is well-developed.  HENT:     Head: Normocephalic and atraumatic.     Right Ear: External ear normal.     Left Ear: External ear normal.  Eyes:     Conjunctiva/sclera: Conjunctivae normal.     Pupils: Pupils are equal, round, and reactive to light.  Neck:     Thyroid: No thyromegaly.  Cardiovascular:     Rate and Rhythm: Normal rate and regular rhythm.     Heart sounds: Normal heart sounds.  Pulmonary:     Effort: Pulmonary effort is normal. No  respiratory distress.     Breath sounds: Normal breath sounds. No wheezing.  Abdominal:     General: There is no distension.     Palpations: Abdomen is soft.     Tenderness: There is no abdominal tenderness.  Musculoskeletal:        General: No tenderness. Normal range of motion.     Cervical back: Normal range of motion and neck supple.  Lymphadenopathy:     Cervical: No cervical adenopathy.  Skin:    General: Skin is warm and dry.  Neurological:     Mental Status: He is alert and oriented to person, place, and time.     Deep Tendon Reflexes: Reflexes are normal and symmetric.  Psychiatric:        Behavior: Behavior normal.       Assessment & Plan:  Rhone Wnorowski is a 57 y.o. male . Annual physical exam - Plan: Comprehensive metabolic panel, Lipid panel, CBC with Differential/Platelet, PSA, Hemoglobin A1c, atorvastatin (LIPITOR) 20 MG tablet  - -anticipatory guidance as below in AVS, screening labs above. Health maintenance items as above in HPI discussed/recommended as applicable.   Hyperlipidemia, unspecified hyperlipidemia type - Plan: Comprehensive metabolic panel, Lipid panel, atorvastatin (LIPITOR) 20 MG tablet  -  Stable, tolerating current regimen. Medications refilled. Labs pending as above.   Screening for deficiency anemia - Plan: CBC with Differential/Platelet  Screening for diabetes mellitus - Plan: Hemoglobin A1c   Meds ordered this encounter  Medications   atorvastatin (LIPITOR) 20 MG tablet    Sig: Take 1 tablet (20 mg total) by mouth daily.    Dispense:  90 tablet    Refill:  1   Patient Instructions  No med changes today. Take care!  Preventive Care 29-39 Years Old, Male Preventive care refers to lifestyle choices and visits with your health  care provider that can promote health and wellness. Preventive care visits are also called wellness exams. What can I expect for my preventive care visit? Counseling During your preventive care visit, your  health care provider may ask about your: Medical history, including: Past medical problems. Family medical history. Current health, including: Emotional well-being. Home life and relationship well-being. Sexual activity. Lifestyle, including: Alcohol, nicotine or tobacco, and drug use. Access to firearms. Diet, exercise, and sleep habits. Safety issues such as seatbelt and bike helmet use. Sunscreen use. Work and work Statistician. Physical exam Your health care provider will check your: Height and weight. These may be used to calculate your BMI (body mass index). BMI is a measurement that tells if you are at a healthy weight. Waist circumference. This measures the distance around your waistline. This measurement also tells if you are at a healthy weight and may help predict your risk of certain diseases, such as type 2 diabetes and high blood pressure. Heart rate and blood pressure. Body temperature. Skin for abnormal spots. What immunizations do I need?  Vaccines are usually given at various ages, according to a schedule. Your health care provider will recommend vaccines for you based on your age, medical history, and lifestyle or other factors, such as travel or where you work. What tests do I need? Screening Your health care provider may recommend screening tests for certain conditions. This may include: Lipid and cholesterol levels. Diabetes screening. This is done by checking your blood sugar (glucose) after you have not eaten for a while (fasting). Hepatitis B test. Hepatitis C test. HIV (human immunodeficiency virus) test. STI (sexually transmitted infection) testing, if you are at risk. Lung cancer screening. Prostate cancer screening. Colorectal cancer screening. Talk with your health care provider about your test results, treatment options, and if necessary, the need for more tests. Follow these instructions at home: Eating and drinking  Eat a diet that includes  fresh fruits and vegetables, whole grains, lean protein, and low-fat dairy products. Take vitamin and mineral supplements as recommended by your health care provider. Do not drink alcohol if your health care provider tells you not to drink. If you drink alcohol: Limit how much you have to 0-2 drinks a day. Know how much alcohol is in your drink. In the [place] one drink equals one 12 oz bottle of beer (355 mL), one 5 oz glass of wine (148 mL), or one 1 oz glass of hard liquor (44 mL). Lifestyle Brush your teeth every morning and night with fluoride toothpaste. Floss one time each day. Exercise for at least 30 minutes 5 or more days each week. Do not use any products that contain nicotine or tobacco. These products include cigarettes, chewing tobacco, and vaping devices, such as e-cigarettes. If you need help quitting, ask your health care provider. Do not use drugs. If you are sexually active, practice safe sex. Use a condom or other form of protection to prevent STIs. Take aspirin only as told by your health care provider. Make sure that you understand how much to take and what form to take. Work with your health care provider to find out whether it is safe and beneficial for you to take aspirin daily. Find healthy ways to manage stress, such as: Meditation, yoga, or listening to music. Journaling. Talking to a trusted person. Spending time with friends and family. Minimize exposure to UV radiation to reduce your risk of skin cancer. Safety Always wear your seat belt while driving or riding in  a vehicle. Do not drive: If you have been drinking alcohol. Do not ride with someone who has been drinking. When you are tired or distracted. While texting. If you have been using any mind-altering substances or drugs. Wear a helmet and other protective equipment during sports activities. If you have firearms in your house, make sure you follow all gun safety procedures. What's next? Go to your  health care provider once a year for an annual wellness visit. Ask your health care provider how often you should have your eyes and teeth checked. Stay up to date on all vaccines. This information is not intended to replace advice given to you by your health care provider. Make sure you discuss any questions you have with your health care provider. Document Revised: 09/04/2020 Document Reviewed: 09/04/2020 Elsevier Patient Education  [place],   Merri Ray, MD [place], [facility] 05/07/22 9:41 AM

## 2022-05-07 NOTE — Patient Instructions (Signed)
No med changes today. Take care!  Preventive Care 56-56 Years Old, Male Preventive care refers to lifestyle choices and visits with your health care provider that can promote health and wellness. Preventive care visits are also called wellness exams. What can I expect for my preventive care visit? Counseling During your preventive care visit, your health care provider may ask about your: Medical history, including: Past medical problems. Family medical history. Current health, including: Emotional well-being. Home life and relationship well-being. Sexual activity. Lifestyle, including: Alcohol, nicotine or tobacco, and drug use. Access to firearms. Diet, exercise, and sleep habits. Safety issues such as seatbelt and bike helmet use. Sunscreen use. Work and work Statistician. Physical exam Your health care provider will check your: Height and weight. These may be used to calculate your BMI (body mass index). BMI is a measurement that tells if you are at a healthy weight. Waist circumference. This measures the distance around your waistline. This measurement also tells if you are at a healthy weight and may help predict your risk of certain diseases, such as type 2 diabetes and high blood pressure. Heart rate and blood pressure. Body temperature. Skin for abnormal spots. What immunizations do I need?  Vaccines are usually given at various ages, according to a schedule. Your health care provider will recommend vaccines for you based on your age, medical history, and lifestyle or other factors, such as travel or where you work. What tests do I need? Screening Your health care provider may recommend screening tests for certain conditions. This may include: Lipid and cholesterol levels. Diabetes screening. This is done by checking your blood sugar (glucose) after you have not eaten for a while (fasting). Hepatitis B test. Hepatitis C test. HIV (human immunodeficiency virus) test. STI  (sexually transmitted infection) testing, if you are at risk. Lung cancer screening. Prostate cancer screening. Colorectal cancer screening. Talk with your health care provider about your test results, treatment options, and if necessary, the need for more tests. Follow these instructions at home: Eating and drinking  Eat a diet that includes fresh fruits and vegetables, whole grains, lean protein, and low-fat dairy products. Take vitamin and mineral supplements as recommended by your health care provider. Do not drink alcohol if your health care provider tells you not to drink. If you drink alcohol: Limit how much you have to 0-2 drinks a day. Know how much alcohol is in your drink. In the [place].S., one drink equals one 12 oz bottle of beer (355 mL), one 5 oz glass of wine (148 mL), or one 1 oz glass of hard liquor (44 mL). Lifestyle Brush your teeth every morning and night with fluoride toothpaste. Floss one time each day. Exercise for at least 30 minutes 5 or more days each week. Do not use any products that contain nicotine or tobacco. These products include cigarettes, chewing tobacco, and vaping devices, such as e-cigarettes. If you need help quitting, ask your health care provider. Do not use drugs. If you are sexually active, practice safe sex. Use a condom or other form of protection to prevent STIs. Take aspirin only as told by your health care provider. Make sure that you understand how much to take and what form to take. Work with your health care provider to find out whether it is safe and beneficial for you to take aspirin daily. Find healthy ways to manage stress, such as: Meditation, yoga, or listening to music. Journaling. Talking to a trusted person. Spending time with friends and family.  Minimize exposure to UV radiation to reduce your risk of skin cancer. Safety Always wear your seat belt while driving or riding in a vehicle. Do not drive: If you have been drinking  alcohol. Do not ride with someone who has been drinking. When you are tired or distracted. While texting. If you have been using any mind-altering substances or drugs. Wear a helmet and other protective equipment during sports activities. If you have firearms in your house, make sure you follow all gun safety procedures. What's next? Go to your health care provider once a year for an annual wellness visit. Ask your health care provider how often you should have your eyes and teeth checked. Stay up to date on all vaccines. This information is not intended to replace advice given to you by your health care provider. Make sure you discuss any questions you have with your health care provider. Document Revised: 09/04/2020 Document Reviewed: 09/04/2020 Elsevier Patient Education  [place].

## 2022-05-09 ENCOUNTER — Encounter: Payer: Self-pay | Admitting: Family Medicine

## 2022-05-14 ENCOUNTER — Other Ambulatory Visit: Payer: Self-pay | Admitting: Gastroenterology

## 2022-05-15 ENCOUNTER — Encounter: Payer: Self-pay | Admitting: Family Medicine

## 2022-05-25 NOTE — Telephone Encounter (Signed)
This message did not route to you at the time, pt notes he has some questions about the continued low platelet count if you can respond as soon as you have the chance due to that mistake I would appreciate it

## 2022-06-05 ENCOUNTER — Telehealth: Payer: Self-pay

## 2022-06-05 NOTE — Telephone Encounter (Signed)
PA request received via CMM for Anucort-HC 25MG  suppositories  PA has been submitted to [place] and is pending determination.  Key: [id number]

## 2022-06-08 ENCOUNTER — Encounter: Payer: No Typology Code available for payment source | Admitting: Gastroenterology

## 2022-06-09 NOTE — Telephone Encounter (Signed)
Received fax denial of Anucort suppositories from [place] stating drug your drug prescription benefit plan does not cover the requested medication. Please advise Dr. Fuller Plan.

## 2022-06-09 NOTE — Telephone Encounter (Signed)
Prep H supp coated with 2.5% hydrocortisone cream in place of Anusol HC

## 2022-06-09 NOTE — Telephone Encounter (Signed)
Left message for patient to return my call. Will MyChart message recommendations since patient is MyChart active.

## 2022-06-22 ENCOUNTER — Encounter: Payer: No Typology Code available for payment source | Admitting: Gastroenterology

## 2022-06-29 ENCOUNTER — Other Ambulatory Visit: Payer: Self-pay | Admitting: Neurology

## 2022-06-29 ENCOUNTER — Telehealth: Payer: Self-pay | Admitting: Neurology

## 2022-06-29 MED ORDER — PREDNISONE 50 MG PO TABS: ORAL_TABLET | ORAL | 0 refills | Status: DC

## 2022-06-29 NOTE — Telephone Encounter (Signed)
Pt states he is having a MC episode. Stated he needs a steroid pack called in to pharmacy

## 2022-06-29 NOTE — Telephone Encounter (Signed)
Called Willie Lucas and let him know that Dr. Epimenio Foot sent him in his Prednisone pack to his pharmacy, also that Dr. Epimenio Foot wanted for him to come in this week. In previous phone call Willie Lucas was offered 07/01/2022 at 9:30am and Willie Lucas turned down appointment. Appointment was offered to him again per Dr. Epimenio Foot wanting him to come in, Willie Lucas said he is going to be out of town and he will call tomorrow to schedule another appointment.

## 2022-06-29 NOTE — Telephone Encounter (Signed)
Called pt and pt stated that he is having tingling and numbness on his arms and hands that started yesterday and has been constant. No signs of illness or infection.    Reviewed pt chart last seen 09/10/2021. Currently no follow up scheduled. Called and offered 07/01/2022 pt declined.   Pt also asked for refill on Maxalt that hasn't been filled since 02/11/2021.

## 2022-06-30 NOTE — Telephone Encounter (Signed)
Last seen on 09/10/21 No 6-12 month follow up scheduled  Last filled on 01/29/22 # 10 tablets (30 day supply)

## 2022-07-15 NOTE — Telephone Encounter (Signed)
Pt has Upcoming Appoint 07/28/2022

## 2022-07-28 ENCOUNTER — Ambulatory Visit (INDEPENDENT_AMBULATORY_CARE_PROVIDER_SITE_OTHER): Payer: No Typology Code available for payment source | Admitting: Neurology

## 2022-07-28 ENCOUNTER — Encounter: Payer: Self-pay | Admitting: Neurology

## 2022-07-28 ENCOUNTER — Telehealth: Payer: Self-pay | Admitting: Neurology

## 2022-07-28 VITALS — BP 152/87 | HR 76 | Ht 70.0 in | Wt 189.0 lb

## 2022-07-28 DIAGNOSIS — R202 Paresthesia of skin: Secondary | ICD-10-CM

## 2022-07-28 DIAGNOSIS — R200 Anesthesia of skin: Secondary | ICD-10-CM | POA: Diagnosis not present

## 2022-07-28 DIAGNOSIS — G35 Multiple sclerosis: Secondary | ICD-10-CM | POA: Diagnosis not present

## 2022-07-28 DIAGNOSIS — R208 Other disturbances of skin sensation: Secondary | ICD-10-CM

## 2022-07-28 MED ORDER — PREDNISONE 50 MG PO TABS: ORAL_TABLET | ORAL | 0 refills | Status: DC

## 2022-07-28 NOTE — Telephone Encounter (Signed)
sent to [facility] they obtain [employer] auth [id number] 

## 2022-07-28 NOTE — Progress Notes (Signed)
[facility]  PATIENT: Willie Lucas DOB: 1965/11/01  REFERRING DOCTOR OR PCP:  Darryll Capers SOURCE: Patient and records from [facility]  _________________________________   HISTORICAL  CHIEF COMPLAINT:  Chief Complaint  Patient presents with   Room 10    Pt is here Alone. Pt states that he is in the middle of a Flare up right now. Pt states that he has numbness in his right big toe and on his left, and sparks of pain all over. Pt states that he has some tingling sensations in the same areas. Pt states that his symptoms started 4-5 days ago. Pt states that he had to stop halfway through his steroid pack.     HISTORY OF PRESENT ILLNESS: Willie Lucas is a 57 y.o.. man with relapsing remitting MS.     07/28/2022 Since Thursday, he has experienced coldness and numbness in the left arm/fingers and the right foot.   He has a milld Lhermitte sign.   One month ago, he had the onset of similar symptoms.  He gave [place] a call and he took 600 mg prednisone x 2 days and a taper and felt much better x 2 weeks.  However, he caught the flu.   His DJD pain also improved when he was on the steroid.[name]    He feels that over the last week his symptoms returned.   Before this current episode his MS has been stable.  He had been off Copaxone since 2012 and has no definite exacerbations.   He has had a few episodes of mild tingling but no significant symptom.  We have done a couple steroid packs in the past but no IV.  His last MRI showed no new lesions.        He is walking well and stays active -- exercise, weights, running daily.    No issues with balance, strnegth or fixed numbness (occ tingling).   Bladder is fine.    His back pain is stable.  He has had several ESI's for lumbar radiculopathy.  He saw Dr. Shon Baton in 2019 for L5-S1 disc protrusion but MRI in 2019 actually looked better.   Surgery was not recommended as he had improved by then.    He has occasional migraine  headaches.  These usually respond to rizatriptan.    Multiple sclerosis History: He was diagnosed with MS around 2000.  Initially he had several exacerbations with numbness and clumsiness and visual changes.[name] He was placed on Copaxone and did not have any further exacerbations. Stopped after 5-6 years.   An MRI was performed 09/08/2012.  It was compared to one dated 01/31/2008 andthere was only one new lesion in that 5 year interval.  There were no acute lesions on either study.  After his diagnosis he began to exercise on a very regular basis and continues to do so. He denies any significant sequela from the MS. Specifically, there is no difficulty with his gait and he notes no numbness or weakness. He does not have bladder or visual dysfunction. His mood issues with anxiety are probably unrelated to the MS. He does not note much for fatigue and remains active and travels frequently.   He denies any cognitive dysfunction and works as a Firefighter.  IMAGING: MRI of the cervical spine 09/05/2021 ([facility]) shows degenerative changes at C2-C3 and C5-C6.  At C5-C6 there is moderate to severe foraminal narrowing to the left due to a disc osteophyte complex that could affect the left C6  nerve root.  This is a little progressed compared to the 2021 MRI.  The spinal cord appears normal.  MRI of the brain 09/05/2021 shows a small number of T2/FLAIR hyperintense foci in the periventricular, juxtacortical and deep white matter.  None of these appear to be acute.  There are no new lesions compared to the 2021 MRI.    MRI brain 11/15/2019  Scattered T2/FLAIR hyperintense foci in the hemispheres in a pattern and configuration consistent with chronic demyelinating plaque associated with multiple sclerosis.  None of the foci enhances or appears to be acute.  Compared to the MRI dated 03/13/2015, there are no new lesions.      There is a normal enhancement pattern and no acute findings.  MRI cervical spine  11/15/2019 showed a possible subtle T2 hyperintense focus to the right at C3-C4 seen on sagittal STIR images but not clearly seen on other images.  Normal spinal cord and mild DJD at C2-C3, C5C6.  No nerve root cmpression  MRI of the lumbar spine 02/27/2018 showed a disc protrusion towards the left at L4-L5 and a disc protrusion towards the right at L5-S1.  The L5-S1 disc protrusion actually has an improved appearance compared to the 2011 MRI.  There is no definite nerve root compression and no spinal stenosis.  REVIEW OF SYSTEMS: Constitutional: No fevers, chills, sweats, or change in appetite Eyes: No visual changes, double vision, eye pain Ear, nose and throat: No hearing loss, ear pain, nasal congestion, sore throat Cardiovascular: No chest pain, palpitations Respiratory:  No shortness of breath at rest or with exertion.   No wheezes GastrointestinaI: No nausea, vomiting, diarrhea, abdominal pain, fecal incontinence Genitourinary:  No dysuria, urinary retention or frequency.  No nocturia. Musculoskeletal:  See above  Integumentary: No rash, pruritus, skin lesions Neurological: as above Psychiatric: No depression at this time.  Notes anxiety Endocrine: No palpitations, diaphoresis, change in appetite, change in weigh or increased thirst Hematologic/Lymphatic:  No anemia, purpura, petechiae. Allergic/Immunologic: No itchy/runny eyes, nasal congestion, recent allergic reactions, rashes  ALLERGIES: No Known Allergies  HOME MEDICATIONS:  Current Outpatient Medications:    atorvastatin (LIPITOR) 20 MG tablet, Take 1 tablet (20 mg total) by mouth daily., Disp: 90 tablet, Rfl: 1   clonazePAM (KLONOPIN) 0.5 MG tablet, Take 1-2 tablets (0.5-1 mg total) by mouth 2 (two) times daily as needed (spasm, back)., Disp: 20 tablet, Rfl: 1   hydrocortisone (ANUSOL-HC) 25 MG suppository, Place 1 suppository (25 mg total) rectally at bedtime., Disp: 14 suppository, Rfl: 5   pantoprazole (PROTONIX) 40 MG  tablet, TAKE 1 TABLET(40 MG) BY MOUTH TWICE DAILY, Disp: 60 tablet, Rfl: 11   rizatriptan (MAXALT) 5 MG tablet, TAKE 1 TABLET BY MOUTH AS NEEDED FOR HEADACHE. MAY REPEAT IN 2 HOURS IF NEEDED, Disp: 10 tablet, Rfl: 2   traMADol (ULTRAM) 50 MG tablet, Take one tablet by mouth three times a day as needed for pain, Disp: 30 tablet, Rfl: 0   triamcinolone cream (KENALOG) 0.1 %, Apply 1 Application topically 2 (two) times daily. As needed for rash., Disp: 30 g, Rfl: 2   omeprazole (PRILOSEC) 40 MG capsule, TAKE 1 CAPSULE BY MOUTH EVERY DAY.NEED APPOINTMENT FOR MORE REFILLS! (Patient not taking: Reported on 07/28/2022), Disp: 30 capsule, Rfl: 0   predniSONE (DELTASONE) 50 MG tablet, Take 12 pills (600 mg) p.o. daily x 3 days then, take 3 pills p.o. the following day, 2 pills p.o. the following day and 1 p.o. the last day, Disp: 42 tablet, Rfl:  0 No current facility-administered medications for this visit.  Facility-Administered Medications Ordered in Other Visits:    gadopentetate dimeglumine (MAGNEVIST) injection 19 mL, 19 mL, Intravenous, Once PRN, Kareemah Grounds, Pearletha Furl, MD  PAST MEDICAL HISTORY: Past Medical History:  Diagnosis Date   Anxiety    Benign fundic gland polyps of stomach    Headache(784.0)    Hyperlipidemia    Hypertension    MS (multiple sclerosis) (HCC)    Neuromuscular disorder (HCC)    Ulcer    hx of duodenal ulcer at age 48    PAST SURGICAL HISTORY: Past Surgical History:  Procedure Laterality Date   COLONOSCOPY     HEMORRHOID SURGERY  04/09/2021   RHINOPLASTY     X2    FAMILY HISTORY: Family History  Problem Relation Age of Onset   Arthritis Mother    Suicidality Father    Brain cancer Brother        died   Diabetes Maternal Grandmother    Colon cancer Neg Hx    Stomach cancer Neg Hx    Esophageal cancer Neg Hx    Pancreatic cancer Neg Hx     SOCIAL HISTORY:  Social History   Socioeconomic History   Marital status: Married    Spouse name: Not on file    Number of children: 2   Years of education: Not on file   Highest education level: Not on file  Occupational History   Occupation: Electrical engineer  Tobacco Use   Smoking status: Former    Packs/day: 0.25    Years: 10.00    Additional pack years: 0.00    Total pack years: 2.50    Types: Cigarettes    Quit date: 1998    Years since quitting: 26.3   Smokeless tobacco: Never  Vaping Use   Vaping Use: Never used  Substance and Sexual Activity   Alcohol use: Yes    Comment: occ   Drug use: No   Sexual activity: Yes    Partners: Female  Other Topics Concern   Not on file  Social History Narrative   Right Handed   2 Cups of Coffee per Day   Social Determinants of Health   Financial Resource Strain: Not on file  Food Insecurity: Not on file  Transportation Needs: Not on file  Physical Activity: Not on file  Stress: Not on file  Social Connections: Not on file  Intimate Partner Violence: Not on file     PHYSICAL EXAM  Vitals:   07/28/22 1406  BP: (!) 152/87  Pulse: 76  Weight: 189 lb (85.7 kg)  Height: 5\' 10"  (1.778 m)     Body mass index is 27.12 kg/m.   General: The patient is well-developed and well-nourished and in mild acute distress  Skin:  Extremities are without rash or edema.  Musculoskeletal: He has mild tenderness over the lower right lumbar paraspinals.  No piriformis tenderness.  No trochanteric bursa tenderness.   Neurologic Exam  Mental status: The patient is alert and oriented x 3 at the time of the examination. The patient has apparent normal recent and remote memory, with an apparently normal attention span and concentration ability.   Speech is normal.  Cranial nerves: Extraocular movements are full.  Facial strength is normal.  Hearing appears to be normal.    Motor: Muscle bulk, tone and strength was normal  Sensory: There were no Tinel signs.  He had intact sensation to touch temperature and vibration in the arms and  legs.  Coordination: Cerebellar testing reveals good finger-nose-finger and heel-to-shin bilaterally.  Gait and station: Station is normal.   Gait is normal. Tandem gait is normal. Romberg is negative.   Reflexes: Deep tendon reflexes are symmetric and normal bilaterally.    [name]    DIAGNOSTIC DATA (LABS, IMAGING, TESTING) - I reviewed patient records, labs, notes, testing and imaging myself where available.  Lab Results  Component Value Date   WBC 5.6 05/07/2022   HGB 16.4 05/07/2022   HCT 47.9 05/07/2022   MCV 92.3 05/07/2022   PLT 146.0 (L) 05/07/2022      Component Value Date/Time   NA 139 05/07/2022 1019   K 4.7 05/07/2022 1019   CL 102 05/07/2022 1019   CO2 29 05/07/2022 1019   GLUCOSE 87 05/07/2022 1019   BUN 11 05/07/2022 1019   CREATININE 1.00 05/07/2022 1019   CALCIUM 9.4 05/07/2022 1019   PROT 6.7 05/07/2022 1019   ALBUMIN 4.7 05/07/2022 1019   AST 22 05/07/2022 1019   ALT 22 05/07/2022 1019   ALKPHOS 73 05/07/2022 1019   BILITOT 0.7 05/07/2022 1019   GFRNONAA >60 02/27/2018 1414   GFRAA >60 02/27/2018 1414   Lab Results  Component Value Date   CHOL 206 (H) 05/07/2022   HDL 57.30 05/07/2022   LDLCALC 132 (H) 05/07/2022   LDLDIRECT 147.0 09/09/2020   TRIG 81.0 05/07/2022   CHOLHDL 4 05/07/2022   Lab Results  Component Value Date   HGBA1C 5.2 05/07/2022   No results found for: "VITAMINB12" Lab Results  Component Value Date   TSH 1.11 02/28/2013     ASSESSMENT AND PLAN  MULTIPLE SCLEROSIS - Plan: MR BRAIN W WO CONTRAST, MR CERVICAL SPINE W WO CONTRAST  Numbness and tingling - Plan: MR BRAIN W WO CONTRAST, MR CERVICAL SPINE W WO CONTRAST  Facial numbness - Plan: MR BRAIN W WO CONTRAST  Dysesthesia   1.  Possible MS exacerbation.  I will call in a high-dose prednisone pack (600 mg daily x 3 days followed by a short taper).  Several MRIs and will have been stable.  We will need to check MRI of the brain and cervical spine to determine if there  is confirming evidence of an exacerbation.  If this is occurring, he would like to get back on a disease modifying therapy and we discussed several options.   2.  The MRI of the cervical spine will also allow [place] to determine if there has been any progression of the degenerative changes that might also be contributing to his symptoms.   3.    Continue Maxalt for migraines.  He will return to see me in 6-12 months or sooner if he has new or worsening neurologic symptoms.   Shikara Mcauliffe A. Epimenio Foot, MD, PhD 07/28/2022, 7:23 PM Certified in Neurology, Clinical Neurophysiology, Sleep Medicine, Pain Medicine and Neuroimaging  [facility] [address] [place], [place] [address] [phone]

## 2022-09-19 ENCOUNTER — Other Ambulatory Visit: Payer: No Typology Code available for payment source

## 2022-09-21 ENCOUNTER — Ambulatory Visit
Admission: RE | Admit: 2022-09-21 | Discharge: 2022-09-21 | Disposition: A | Discharge status: Home / Self Care | Payer: No Typology Code available for payment source | Source: Ambulatory Visit | Attending: Neurology | Admitting: Neurology

## 2022-09-21 DIAGNOSIS — R200 Anesthesia of skin: Secondary | ICD-10-CM

## 2022-09-21 DIAGNOSIS — R202 Paresthesia of skin: Secondary | ICD-10-CM

## 2022-09-21 DIAGNOSIS — G35 Multiple sclerosis: Secondary | ICD-10-CM

## 2022-09-21 DIAGNOSIS — G35D Multiple sclerosis, unspecified: Secondary | ICD-10-CM

## 2022-09-21 MED ORDER — GADOPICLENOL 0.5 MMOL/ML IV SOLN
9.0000 mL | Freq: Once | INTRAVENOUS | Status: AC | PRN
  Administered 2022-09-21: 9 mL via INTRAVENOUS

## 2022-09-22 ENCOUNTER — Encounter: Payer: Self-pay | Admitting: Neurology

## 2022-10-21 ENCOUNTER — Encounter (INDEPENDENT_AMBULATORY_CARE_PROVIDER_SITE_OTHER): Payer: Self-pay

## 2022-11-05 ENCOUNTER — Ambulatory Visit: Payer: No Typology Code available for payment source | Admitting: Family Medicine

## 2022-12-22 ENCOUNTER — Other Ambulatory Visit: Payer: Self-pay | Admitting: Family Medicine

## 2022-12-22 DIAGNOSIS — E785 Hyperlipidemia, unspecified: Secondary | ICD-10-CM

## 2022-12-22 DIAGNOSIS — Z0000 Encounter for general adult medical examination without abnormal findings: Secondary | ICD-10-CM

## 2023-01-02 ENCOUNTER — Other Ambulatory Visit: Payer: Self-pay | Admitting: Neurology

## 2023-01-04 NOTE — Telephone Encounter (Signed)
Last seen on 07/28/22 Follow up scheduled on 02/02/23

## 2023-02-02 ENCOUNTER — Ambulatory Visit: Payer: No Typology Code available for payment source | Admitting: Neurology

## 2023-07-07 ENCOUNTER — Other Ambulatory Visit: Payer: Self-pay | Admitting: Family Medicine

## 2023-07-07 DIAGNOSIS — Z0000 Encounter for general adult medical examination without abnormal findings: Secondary | ICD-10-CM

## 2023-07-07 DIAGNOSIS — E785 Hyperlipidemia, unspecified: Secondary | ICD-10-CM

## 2023-07-25 ENCOUNTER — Emergency Department (HOSPITAL_COMMUNITY)
Admission: EM | Admit: 2023-07-25 | Discharge: 2023-07-25 | Disposition: A | Discharge status: Home / Self Care | Attending: Emergency Medicine | Admitting: Emergency Medicine

## 2023-07-25 DIAGNOSIS — M545 Low back pain, unspecified: Secondary | ICD-10-CM | POA: Diagnosis present

## 2023-07-25 LAB — CBC WITH DIFFERENTIAL/PLATELET
Abs Immature Granulocytes: 0.02 10*3/uL (ref 0.00–0.07)
Basophils Absolute: 0.1 10*3/uL (ref 0.0–0.1)
Basophils Relative: 1 %
Eosinophils Absolute: 0.1 10*3/uL (ref 0.0–0.5)
Eosinophils Relative: 1 %
HCT: 43 % (ref 39.0–52.0)
Hemoglobin: 15.1 g/dL (ref 13.0–17.0)
Immature Granulocytes: 0 %
Lymphocytes Relative: 15 %
Lymphs Abs: 1.3 10*3/uL (ref 0.7–4.0)
MCH: 31.6 pg (ref 26.0–34.0)
MCHC: 35.1 g/dL (ref 30.0–36.0)
MCV: 90 fL (ref 80.0–100.0)
Monocytes Absolute: 0.6 10*3/uL (ref 0.1–1.0)
Monocytes Relative: 7 %
Neutro Abs: 6.6 10*3/uL (ref 1.7–7.7)
Neutrophils Relative %: 76 %
Platelets: 141 10*3/uL — ABNORMAL LOW (ref 150–400)
RBC: 4.78 MIL/uL (ref 4.22–5.81)
RDW: 11.6 % (ref 11.5–15.5)
WBC: 8.6 10*3/uL (ref 4.0–10.5)
nRBC: 0 % (ref 0.0–0.2)

## 2023-07-25 LAB — BASIC METABOLIC PANEL WITH GFR
Anion gap: 12 (ref 5–15)
BUN: 17 mg/dL (ref 6–20)
CO2: 24 mmol/L (ref 22–32)
Calcium: 9 mg/dL (ref 8.9–10.3)
Chloride: 105 mmol/L (ref 98–111)
Creatinine, Ser: 1.08 mg/dL (ref 0.61–1.24)
GFR, Estimated: >60 mL/min (ref >60)
Glucose, Bld: 89 mg/dL (ref 70–99)
Potassium: 3.6 mmol/L (ref 3.5–5.1)
Sodium: 141 mmol/L (ref 135–145)

## 2023-07-25 MED ORDER — KETOROLAC TROMETHAMINE 30 MG/ML IJ SOLN
30.0000 mg | Freq: Once | INTRAMUSCULAR | Status: AC
  Administered 2023-07-25: 30 mg via INTRAVENOUS
  Filled 2023-07-25: qty 1

## 2023-07-25 MED ORDER — METHYLPREDNISOLONE 4 MG PO TBPK: ORAL_TABLET | ORAL | 0 refills | Status: DC

## 2023-07-25 MED ORDER — ONDANSETRON HCL 4 MG PO TABS: 4.0000 mg | ORAL_TABLET | Freq: Three times a day (TID) | ORAL | 0 refills | Status: DC | PRN

## 2023-07-25 MED ORDER — DIAZEPAM 10 MG PO TABS: 10.0000 mg | ORAL_TABLET | Freq: Three times a day (TID) | ORAL | 0 refills | Status: AC | PRN

## 2023-07-25 MED ORDER — DIAZEPAM 5 MG PO TABS
5.0000 mg | ORAL_TABLET | Freq: Once | ORAL | Status: AC
  Administered 2023-07-25: 5 mg via ORAL
  Filled 2023-07-25: qty 1

## 2023-07-25 MED ORDER — OXYCODONE-ACETAMINOPHEN 5-325 MG PO TABS: 1.0000 | ORAL_TABLET | Freq: Four times a day (QID) | ORAL | 0 refills | Status: DC | PRN

## 2023-07-25 MED ORDER — DEXAMETHASONE SODIUM PHOSPHATE 10 MG/ML IJ SOLN
10.0000 mg | Freq: Once | INTRAMUSCULAR | Status: AC
  Administered 2023-07-25: 10 mg via INTRAVENOUS
  Filled 2023-07-25: qty 1

## 2023-07-25 MED ORDER — HYDROMORPHONE HCL 1 MG/ML IJ SOLN
1.0000 mg | Freq: Once | INTRAMUSCULAR | Status: AC
  Administered 2023-07-25: 1 mg via INTRAVENOUS
  Filled 2023-07-25: qty 1

## 2023-07-25 MED ORDER — HYDROMORPHONE HCL 1 MG/ML IJ SOLN
0.5000 mg | Freq: Once | INTRAMUSCULAR | Status: AC
  Administered 2023-07-25: 0.5 mg via INTRAVENOUS
  Filled 2023-07-25: qty 1

## 2023-07-25 MED ORDER — HYDROMORPHONE HCL 1 MG/ML IJ SOLN
1.0000 mg | Freq: Once | INTRAMUSCULAR | Status: DC
  Filled 2023-07-25: qty 1

## 2023-07-25 MED ORDER — ONDANSETRON HCL 4 MG/2ML IJ SOLN
4.0000 mg | Freq: Once | INTRAMUSCULAR | Status: AC
Start: 2023-07-25 — End: 2023-07-25
  Administered 2023-07-25: 4 mg via INTRAVENOUS
  Filled 2023-07-25: qty 2

## 2023-07-25 NOTE — Discharge Instructions (Signed)
 You were seen in the emergency department for acute back spasms.  We are prescribing you some narcotic pain medicine and Valium  for muscle relaxation.  Steroid taper 6 tablets first day decrease by 1 tablet each day until finished.  You can also take ibuprofen or Naprosyn.  Contact your spine team for close follow-up.  Return if any worsening or concerning symptoms

## 2023-07-25 NOTE — ED Triage Notes (Signed)
 Patient BIB [facility] from home. Patient reports he was pulling a root in the garden which caused a disc to bulge and has previous hx of same. 8/10 pain while still and positioned for comfort, 200mcg fentanyl given PTA with minimal relief. Patient is A&Ox4, VSS at this time.

## 2023-07-25 NOTE — ED Provider Notes (Signed)
 [place] EMERGENCY DEPARTMENT AT [facility] Provider Note   CSN: [id number] Arrival date & time: 07/25/23  1631     History {Add pertinent medical, surgical, social history, OB history to HPI:1} Chief Complaint  Patient presents with   Willie Lucas is a 58 y.o. male.  He is presenting by ambulance after severe back pain episode.  He was outside at home doing gardening and was pulling a root when he experienced acute pain and spasm in his lower back radiating down both of his legs.  Has a history of disc problems in his back.  He collapsed forward into the dirt, denies being hurt from that.  EMS gave him some fentanyl for transport.  No bowel or bladder incontinence.  No chest or abdominal pain.  No head or neck pain.  The history is provided by the patient and the EMS personnel.  Back Pain Location:  Lumbar spine Quality:  Stabbing Radiates to: both legs. Pain severity:  Severe Onset quality:  Sudden Timing:  Constant Progression:  Unchanged Chronicity:  Recurrent Context: lifting heavy objects   Relieved by:  Nothing Worsened by:  Movement Associated symptoms: leg pain   Associated symptoms: no abdominal pain, no bladder incontinence, no bowel incontinence and no chest pain        Home Medications Prior to Admission medications   Not on File      Allergies    Patient has no allergy information on record.    Review of Systems   Review of Systems  Cardiovascular:  Negative for chest pain.  Gastrointestinal:  Negative for abdominal pain and bowel incontinence.  Genitourinary:  Negative for bladder incontinence.  Musculoskeletal:  Positive for back pain.    Physical Exam Updated Vital Signs There were no vitals taken for this visit. Physical Exam Vitals and nursing note reviewed.  Constitutional:      General: He is not in acute distress.    Appearance: Normal appearance. He is well-developed.  HENT:     Head: Normocephalic and atraumatic.   Eyes:     Conjunctiva/sclera: Conjunctivae normal.  Cardiovascular:     Rate and Rhythm: Normal rate and regular rhythm.     Heart sounds: No murmur heard. Pulmonary:     Effort: Pulmonary effort is normal. No respiratory distress.     Breath sounds: Normal breath sounds.  Abdominal:     Palpations: Abdomen is soft.     Tenderness: There is no abdominal tenderness. There is no guarding or rebound.  Musculoskeletal:        General: Tenderness present. No deformity.     Cervical back: Neck supple.     Comments: He has no cervical or thoracic tenderness.  He has some lumbar tenderness.  There are no obvious step-offs no ecchymosis  Skin:    General: Skin is warm and dry.     Capillary Refill: Capillary refill takes less than 2 seconds.  Neurological:     General: No focal deficit present.     Mental Status: He is alert.     Sensory: No sensory deficit.     ED Results / Procedures / Treatments   Labs (all labs ordered are listed, but only abnormal results are displayed) Labs Reviewed  BASIC METABOLIC PANEL WITH GFR  CBC WITH DIFFERENTIAL/PLATELET    EKG None  Radiology No results found.  Procedures Procedures  {Document cardiac monitor, telemetry assessment procedure when appropriate:1}  Medications Ordered in ED Medications  ketorolac  (TORADOL ) 30 MG/ML injection 30 mg (has no administration in time range)  HYDROmorphone  (DILAUDID ) injection 1 mg (has no administration in time range)    ED Course/ Medical Decision Making/ A&P   {   Click here for ABCD2, HEART and other calculatorsREFRESH Note before signing :1}                              Medical Decision Making Amount and/or Complexity of Data Reviewed Labs: ordered.  Risk Prescription drug management.   This patient complains of ***; this involves an extensive number of treatment Options and is a complaint that carries with it a high risk of complications and morbidity. The differential includes  ***  I ordered, reviewed and interpreted labs, which included *** I ordered medication *** and reviewed PMP when indicated. I ordered imaging studies which included *** and I independently    visualized and interpreted imaging which showed *** Additional history obtained from *** Previous records obtained and reviewed *** I consulted *** and discussed lab and imaging findings and discussed disposition.  Cardiac monitoring reviewed, *** Social determinants considered, *** Critical Interventions: ***  After the interventions stated above, I reevaluated the patient and found *** Admission and further testing considered, ***   {Document critical care time when appropriate:1} {Document review of labs and clinical decision tools ie heart score, Chads2Vasc2 etc:1}  {Document your independent review of radiology images, and any outside records:1} {Document your discussion with family members, caretakers, and with consultants:1} {Document social determinants of health affecting pt's care:1} {Document your decision making why or why not admission, treatments were needed:1} Final Clinical Impression(s) / ED Diagnoses Final diagnoses:  None    Rx / DC Orders ED Discharge Orders     None

## 2023-10-19 ENCOUNTER — Ambulatory Visit (HOSPITAL_BASED_OUTPATIENT_CLINIC_OR_DEPARTMENT_OTHER)
Admission: RE | Admit: 2023-10-19 | Discharge: 2023-10-19 | Disposition: A | Discharge status: Home / Self Care | Source: Ambulatory Visit | Attending: Student in an Organized Health Care Education/Training Program | Admitting: Student in an Organized Health Care Education/Training Program

## 2023-10-19 ENCOUNTER — Ambulatory Visit: Payer: Self-pay | Admitting: Student in an Organized Health Care Education/Training Program

## 2023-10-19 ENCOUNTER — Ambulatory Visit (INDEPENDENT_AMBULATORY_CARE_PROVIDER_SITE_OTHER): Admitting: Student in an Organized Health Care Education/Training Program

## 2023-10-19 VITALS — BP 112/65 | HR 55 | Wt 183.0 lb

## 2023-10-19 DIAGNOSIS — R1084 Generalized abdominal pain: Secondary | ICD-10-CM | POA: Diagnosis not present

## 2023-10-19 DIAGNOSIS — K5100 Ulcerative (chronic) pancolitis without complications: Secondary | ICD-10-CM

## 2023-10-19 DIAGNOSIS — R519 Headache, unspecified: Secondary | ICD-10-CM | POA: Diagnosis not present

## 2023-10-19 MED ORDER — RIZATRIPTAN BENZOATE 5 MG PO TABS: ORAL_TABLET | ORAL | 2 refills | Status: DC

## 2023-10-19 MED ORDER — IOHEXOL 300 MG/ML  SOLN
100.0000 mL | Freq: Once | INTRAMUSCULAR | Status: AC | PRN
  Administered 2023-10-19: 100 mL via INTRAVENOUS

## 2023-10-19 MED ORDER — OMEPRAZOLE 40 MG PO CPDR: 40.0000 mg | DELAYED_RELEASE_CAPSULE | Freq: Every day | ORAL | 2 refills | Status: DC

## 2023-10-19 MED ORDER — DICYCLOMINE HCL 20 MG PO TABS
20.0000 mg | ORAL_TABLET | Freq: Four times a day (QID) | ORAL | 0 refills | Status: DC | PRN
Start: 2023-10-19 — End: 2023-11-24

## 2023-10-19 NOTE — Assessment & Plan Note (Signed)
 Acute issue of abdominal pain in a person with a history of peptic ulcer disease and probably irritable bowel syndrome.  This current episode has been lasting about 4 days, acute in nature.  He has some high risk features on his exam including focal tenderness in the right lower quadrant, epigastrium, and guarding.  Could represent peritonitis.  Doing just okay at home, low appetite, reduced food intake, but staying hydrated.  Reports some hematochezia, history of hemorrhoids.  I think differential includes appendicitis, gallbladder disease, inflammatory enteritis, or pancreatitis.  I have ordered a CT of the abdomen to evaluate for structural disease.  Okay to go home and continue supportive care.  We talked about the safe use of PPIs and Bentyl  for the cramping discomfort that he has.  Avoid NSAIDs.  We talked about staying hydrated and eating as able.  I think treatment from here will depend on the results of the CT.

## 2023-10-19 NOTE — Progress Notes (Signed)
 Acute Office Visit  Subjective:     Patient ID: Willie Lucas, male    DOB: 1965/08/18, 58 y.o.   MRN: [id number]  Chief Complaint  Patient presents with   Follow-up    Over the weekend patinet started having adbdominal pain amd very gassy. Urgetn care was concerned with blood wokr. Having spasm in abdominal and recutm area and has not had much of an appetite.     HPI  Discussed the use of AI scribe software for clinical note transcription with the patient, who gave verbal consent to proceed.  History of Present Illness Willie Lucas is a 58 year old male with a history of duodenal ulcer and multiple sclerosis who presents with intestinal discomfort and cramping.  He has been experiencing sharp intestinal discomfort and cramping for a couple of days, located in the duodenal area with radiation. He describes intestinal spasms with significant pressure on the anus and intestinal area. Despite frequent urges to defecate, he has only had two bowel movements today with spasms but without significant output.  He has a history of a duodenal ulcer diagnosed at age 49, which was problematic for years but has not been an issue recently. He is unsure if the current discomfort is similar to his past ulcer pain. He also experiences vasovagal syncope related to intestinal spasms, causing urgent bathroom needs, cold sweats, and occasional fainting. He has a history of hemorrhoids and reports bright red blood in his stool this morning, which he attributes to hemorrhoid irritation. He also had black, pasty stools two days ago after taking Pepto-Bismol.  He has not been eating much due to cramping and reports feeling very swollen in his stomach, with significant gas pressure. After eating, he feels sore and experiences excessive burping. His routine has been disrupted by his symptoms, including a lack of appetite and increased lethargy, leading to uncharacteristic naps.  He has a history of multiple  sclerosis but has been off medication for 15 years, experiencing only occasional tactile issues and numbness. He takes Prilosec 20 mg over-the-counter twice daily at bedtime for acid suppression, as his prescription for 40 mg expired. He also uses medications like Bentyl , Valium , Klonopin , and Zofran  for muscle spasms and anxiety related to MS and a lower disc issue.  He reports cold sweats when needing to use the bathroom but denies fevers or chills. He has not had any recent travel except for a trip to [place] , where his symptoms began. He denies any dietary intolerances and has not had any recent surgeries or gallbladder removal. He feels nutritionally deficient and weak, with a sudden onset of symptoms worsening over the past few days.      Objective:    BP 112/65   Pulse (!) 55   Wt 183 lb (83 kg)   BMI 26.26 kg/m   Physical Exam  Gen: Uncomfortable appearing man Neck: Normal thyroid, no adenopathy or nodules Heart: Regular, no murmur Lungs: Unlabored, clear without crackles Abd: Soft, moderate tenderness in the right lower quadrant in the epigastrium with guarding Ext: Warm, no edema      Assessment & Plan:    Problem List Items Addressed This Visit       Unprioritized   Generalized abdominal pain - Primary   Acute issue of abdominal pain in a person with a history of peptic ulcer disease and probably irritable bowel syndrome.  This current episode has been lasting about 4 days, acute in nature.  He has some high risk  features on his exam including focal tenderness in the right lower quadrant, epigastrium, and guarding.  Could represent peritonitis.  Doing just okay at home, low appetite, reduced food intake, but staying hydrated.  Reports some hematochezia, history of hemorrhoids.  I think differential includes appendicitis, gallbladder disease, inflammatory enteritis, or pancreatitis.  I have ordered a CT of the abdomen to evaluate for structural disease.  Okay to go  home and continue supportive care.  We talked about the safe use of PPIs and Bentyl  for the cramping discomfort that he has.  Avoid NSAIDs.  We talked about staying hydrated and eating as able.  I think treatment from here will depend on the results of the CT.      Relevant Medications   omeprazole  (PRILOSEC) 40 MG capsule   dicyclomine  (BENTYL ) 20 MG tablet   Other Relevant Orders   CT ABDOMEN PELVIS W CONTRAST   Headache   Relevant Medications   rizatriptan  (MAXALT ) 5 MG tablet    Meds ordered this encounter  Medications   omeprazole  (PRILOSEC) 40 MG capsule    Sig: Take 1 capsule (40 mg total) by mouth daily. TAKE 1 CAPSULE BY MOUTH EVERY DAY.NEED APPOINTMENT FOR MORE REFILLS!    Dispense:  30 capsule    Refill:  2   rizatriptan  (MAXALT ) 5 MG tablet    Sig: TAKE 1 TABLET BY MOUTH AS NEEDED FOR HEADACHE. MAY REPEAT ONCE IN 2 HOURS IF NEEDED    Dispense:  10 tablet    Refill:  2   dicyclomine  (BENTYL ) 20 MG tablet    Sig: Take 1 tablet (20 mg total) by mouth every 6 (six) hours as needed for spasms.    Dispense:  90 tablet    Refill:  0    Return if symptoms worsen or fail to improve.  Cleatus Debby Specking, MD

## 2023-10-19 NOTE — Patient Instructions (Signed)
  VISIT SUMMARY: Today, we discussed your recent symptoms of intestinal discomfort and cramping. You have a history of duodenal ulcer and multiple sclerosis, and you reported sharp pain, cramping, and other related symptoms. We reviewed your current medications and symptoms, and we have made some adjustments to your treatment plan.  YOUR PLAN: -ACUTE ABDOMINAL PAIN WITH CRAMPING AND TENDERNESS: This means you are experiencing sharp pain and cramping in your abdomen. We will perform a CT scan of your abdomen with contrast to investigate further. You should take Prilosec 40 mg twice daily and continue using dicyclomine  for cramping. Follow a low FODMAP and low residue diet, and monitor for any dietary triggers like lactose and gluten.  -CHRONIC THROMBOCYTOPENIA: This is a condition where you have a lower than normal platelet count. Your platelet count is stable at 121,000, and there are no acute issues at this time.  -MULTIPLE SCLEROSIS: This is a condition that affects your central nervous system, causing symptoms like numbness and tactile issues. Your condition is currently stable with no new lesions.  -HEMORRHOIDS: These are swollen veins in your lower rectum and anus that can cause bleeding. The bright red blood in your stool is likely due to irritation or rupture of the hemorrhoids.  -VASOVAGAL SYNCOPE RELATED TO ANORECTAL SPASM: This is a condition where you faint due to a sudden drop in heart rate and blood pressure, often triggered by pain or stress. Your symptoms include urgent defecation, cold sweats, and occasional fainting.  INSTRUCTIONS: Please schedule a CT scan of your abdomen with contrast as soon as possible. Continue taking Prilosec 40 mg twice daily and dicyclomine  for cramping. Follow a low FODMAP and low residue diet, and monitor for any dietary triggers like lactose and gluten. If you experience any worsening symptoms or new issues, please contact our office immediately.

## 2023-10-20 ENCOUNTER — Ambulatory Visit: Payer: Self-pay | Admitting: Student in an Organized Health Care Education/Training Program

## 2023-10-20 ENCOUNTER — Other Ambulatory Visit (INDEPENDENT_AMBULATORY_CARE_PROVIDER_SITE_OTHER)

## 2023-10-20 DIAGNOSIS — K5100 Ulcerative (chronic) pancolitis without complications: Secondary | ICD-10-CM

## 2023-10-20 LAB — CBC WITH DIFFERENTIAL/PLATELET
Basophils Absolute: 0 K/uL (ref 0.0–0.1)
Basophils Relative: 0.3 % (ref 0.0–3.0)
Eosinophils Absolute: 0.1 K/uL (ref 0.0–0.7)
Eosinophils Relative: 0.5 % (ref 0.0–5.0)
HCT: 44.2 % (ref 39.0–52.0)
Hemoglobin: 14.9 g/dL (ref 13.0–17.0)
Lymphocytes Relative: 2.3 % — ABNORMAL LOW (ref 12.0–46.0)
Lymphs Abs: 0.3 K/uL — ABNORMAL LOW (ref 0.7–4.0)
MCHC: 33.7 g/dL (ref 30.0–36.0)
MCV: 91.9 fl (ref 78.0–100.0)
Monocytes Absolute: 0.9 K/uL (ref 0.1–1.0)
Monocytes Relative: 7.8 % (ref 3.0–12.0)
Neutro Abs: 10.5 K/uL — ABNORMAL HIGH (ref 1.4–7.7)
Neutrophils Relative %: 89.1 % — ABNORMAL HIGH (ref 43.0–77.0)
Platelets: 111 K/uL — ABNORMAL LOW (ref 150.0–400.0)
RBC: 4.81 Mil/uL (ref 4.22–5.81)
RDW: 12.4 % (ref 11.5–15.5)
WBC: 11.8 K/uL — ABNORMAL HIGH (ref 4.0–10.5)

## 2023-10-20 LAB — C-REACTIVE PROTEIN: CRP: 2.6 mg/dL (ref 0.5–20.0)

## 2023-10-20 LAB — COMPREHENSIVE METABOLIC PANEL WITH GFR
ALT: 84 U/L — ABNORMAL HIGH (ref 0–53)
AST: 98 U/L — ABNORMAL HIGH (ref 0–37)
Albumin: 4.1 g/dL (ref 3.5–5.2)
Alkaline Phosphatase: 88 U/L (ref 39–117)
BUN: 14 mg/dL (ref 6–23)
CO2: 28 meq/L (ref 19–32)
Calcium: 8.7 mg/dL (ref 8.4–10.5)
Chloride: 101 meq/L (ref 96–112)
Creatinine, Ser: 1.03 mg/dL (ref 0.40–1.50)
GFR: 81.11 mL/min (ref >60.00)
Glucose, Bld: 101 mg/dL — ABNORMAL HIGH (ref 70–99)
Potassium: 3.9 meq/L (ref 3.5–5.1)
Sodium: 138 meq/L (ref 135–145)
Total Bilirubin: 1 mg/dL (ref 0.2–1.2)
Total Protein: 5.9 g/dL — ABNORMAL LOW (ref 6.0–8.3)

## 2023-10-20 LAB — SEDIMENTATION RATE: Sed Rate: 6 mm/h (ref 0–20)

## 2023-10-20 MED ORDER — VANCOMYCIN HCL 125 MG PO CAPS
125.0000 mg | ORAL_CAPSULE | Freq: Four times a day (QID) | ORAL | 0 refills | Status: AC
Start: 2023-10-20 — End: 2023-10-30

## 2023-10-20 NOTE — Progress Notes (Signed)
 Pt has lab appt scheduled for today 10/20/23

## 2023-10-20 NOTE — Telephone Encounter (Signed)
 I spoke with the patient by phone this morning.  He had a really rough night.  Lots of abdominal discomfort, diarrhea, tenesmus.  He took a dose of oxycodone  leftover from a prior surgery.  More comfortable this morning.  He has borderline oral intake.  I learned some more history from him.  He tells me that he had a course of Augmentin 2 weeks ago from an urgent care at [facility] for treatment of a upper respiratory tract infection.  I think this significantly raises the risk of C. difficile colitis.  Based on the history, CT findings, and the risk of C. difficile, we are going to check for C. difficile this morning with a stool collection kit, but I am also going to start empiric oral vancomycin  therapy.  I think his infection is moderate, may be approaching severe depending on his ability to take in orals.  We are close to needing hospitalization.  I offered him hospitalization based on his pain and difficulty with oral intake.  He wants to try another day at home, he is comfortable right now.  I am hopeful that starting oral vancomycin  will help today while we wait for the c diff testing.  If the C. difficile testing is negative, we can consider starting steroids for empiric treatment of IBD.  Patient does have multiple sclerosis so is at risk for other autoimmune conditions.  I think this is less likely though then C. difficile colitis.

## 2023-10-22 LAB — C DIFFICILE TOXINS A+B W/RFLX

## 2023-11-08 ENCOUNTER — Ambulatory Visit: Payer: Self-pay

## 2023-11-08 ENCOUNTER — Ambulatory Visit (INDEPENDENT_AMBULATORY_CARE_PROVIDER_SITE_OTHER): Admitting: Student in an Organized Health Care Education/Training Program

## 2023-11-08 ENCOUNTER — Encounter: Payer: Self-pay | Admitting: Student in an Organized Health Care Education/Training Program

## 2023-11-08 VITALS — BP 131/70 | HR 83 | Temp 99.3°F | Resp 13 | Ht 70.0 in | Wt 179.4 lb

## 2023-11-08 DIAGNOSIS — L503 Dermatographic urticaria: Secondary | ICD-10-CM | POA: Insufficient documentation

## 2023-11-08 DIAGNOSIS — R197 Diarrhea, unspecified: Secondary | ICD-10-CM | POA: Diagnosis not present

## 2023-11-08 DIAGNOSIS — J309 Allergic rhinitis, unspecified: Secondary | ICD-10-CM | POA: Insufficient documentation

## 2023-11-08 NOTE — Telephone Encounter (Signed)
 Got patient an appointment this afternoon, worried he may not make it right at 2:40 may be closer to 3 but he will call if he cannot make it and will get appt for tomorrow if that is the case

## 2023-11-08 NOTE — Patient Instructions (Signed)
  VISIT SUMMARY: During your visit, we discussed your recurrent gastrointestinal symptoms, including diarrhea, vomiting, and dehydration, which have persisted despite initial improvement with vancomycin . We also addressed your recent weight loss, nausea, and a syncope episode. You have a scheduled appointment with a gastroenterologist in early September for further evaluation.  YOUR PLAN: -RECURRENT COLITIS WITH DIARRHEA, NAUSEA, VOMITING, WEIGHT LOSS, AND BLOOD IN STOOL: Recurrent colitis is an inflammation of the colon that can cause symptoms like diarrhea, nausea, vomiting, weight loss, and blood in the stool. We will test your stool for C. difficile, and if positive, we will prescribe a different antibiotic. If the test is negative, we may consider using steroids until your gastroenterology appointment. Zofran  is prescribed to help with nausea, and you can take half a tablet of oxycodone  for severe cramping pain as needed. Please continue to stay hydrated with Gatorade or Pedialyte.  -DEHYDRATION: Dehydration occurs when your body loses more fluids than it takes in, leading to weakness and other symptoms. To manage this, please drink electrolyte solutions like Gatorade or Pedialyte. Zofran  will help control your nausea and improve your fluid intake. If you are unable to stay hydrated and your symptoms worsen, we may need to consider hospital admission for IV fluids.  -SYNCOPE EPISODE: A syncope episode is a temporary loss of consciousness, often related to dehydration and weakness in your case. Preventing further episodes involves managing your dehydration and gastrointestinal symptoms effectively.  INSTRUCTIONS: Please follow up with your gastroenterologist as scheduled in early September. In the meantime, monitor your symptoms closely and seek immediate medical attention if your condition worsens or if you are unable to stay hydrated.

## 2023-11-08 NOTE — Telephone Encounter (Signed)
 I can work him in to see me today at 240 or tomorrow at an open spot.

## 2023-11-08 NOTE — Progress Notes (Signed)
 Acute Office Visit  Subjective:     Patient ID: Willie Lucas, male    DOB: 25-Apr-1965, 58 y.o.   MRN: [id number]  Chief Complaint  Patient presents with   Follow-up    low appetite,nausea,vomiting when consuming liquid, achy, possibly dehydrated. Chills. Previous dx of c-diff. Thursday last week he felt better. Two days later sx returned.     HPI  Discussed the use of AI scribe software for clinical note transcription with the patient, who gave verbal consent to proceed.  History of Present Illness Willie Lucas is a 58 year old male with multiple sclerosis who presents with recurrent gastrointestinal symptoms and dehydration.  He experienced a recurrence of gastrointestinal symptoms after a brief period of improvement. Initially, he had relief from cramps on Thursday night, but symptoms returned the following day. Symptoms include diarrhea, vomiting, joint aches, and a syncope episode accompanied by excessive sweating. He describes significant fluid loss and dehydration, feeling 'very weak' and 'a mess right now.'  He completed a 10-day course of vancomycin  approximately two weeks ago, which initially improved his symptoms after three to four days of treatment. However, he continued to experience cramps after meals and a lack of appetite, resulting in a weight loss of approximately 10 pounds since July 29, when he weighed 183 pounds.  He has multiple bowel movements, including an irregular one in the morning and two to three episodes overnight, accompanied by vomiting. He attempted to manage his symptoms with Imodium D and a cramp-settling pill but was unable to take them due to nausea, which led to vomiting. He has been trying to stay hydrated with small sips of Gatorade but struggles with fluid intake due to nausea and cramping.  He reports passing phlegm with a little blood in his stool, though he describes the bowel movements as mostly liquid and painless. He has a history of MS and  mentions experiencing some neuropathy. No solid bowel movements since the morning and he is concerned about dehydration, feeling 'a little wonky.'  He has a scheduled appointment with a gastroenterologist in early September. He has previously tolerated steroids well due to his MS history and recently completed a course of steroids and antibiotics for bronchitis.      Objective:    BP 131/70 (BP Location: Right Arm, Patient Position: Sitting, Cuff Size: Normal)   Pulse 83   Temp 99.3 F (37.4 C)   Resp 13   Ht 5' 10 (1.778 m)   Wt 179 lb 6.4 oz (81.4 kg)   SpO2 96%   BMI 25.74 kg/m   Physical Exam  Gen: Ill-appearing man Heart: Regular, no murmur Lungs: Unlabored, clear throughout Abd: Soft, tender to palpation, no rebound tenderness, no guarding Ext: Warm, no edema     Assessment & Plan:    Problem List Items Addressed This Visit       Unprioritized   Acute diarrhea - Primary   He experiences recurrent colitis with symptoms of diarrhea, nausea, vomiting, weight loss, and blood in stool. Initial improvement was noted with vancomycin , but symptoms recurred.  3 weeks ago he had symptoms that were consistent with C. difficile colitis, but we were never able to confirm that with a stool sample.  We treated him empirically.  The differential diagnosis now includes recurrent C. difficile, other infectious colitis, or autoimmune conditions such as inflammatory bowel disease, with an autoimmune etiology being more likely due to his history of autoimmune diseases. A stool sample for C. difficile will  be ordered. If confirmed, treatment will involve Dificid . If not confirmed, steroids will be considered until a GI consultation. Zofran  is prescribed for nausea, and oxycodone  is advised for severe cramping pain, half a tablet as needed. He is encouraged to maintain hydration with oral intake of Gatorade or Pedialyte.       Relevant Orders   C difficile Toxins A+B W/Rflx   Return if  symptoms worsen or fail to improve.  Cleatus Debby Specking, MD

## 2023-11-08 NOTE — Assessment & Plan Note (Signed)
 He experiences recurrent colitis with symptoms of diarrhea, nausea, vomiting, weight loss, and blood in stool. Initial improvement was noted with vancomycin , but symptoms recurred.  3 weeks ago he had symptoms that were consistent with C. difficile colitis, but we were never able to confirm that with a stool sample.  We treated him empirically.  The differential diagnosis now includes recurrent C. difficile, other infectious colitis, or autoimmune conditions such as inflammatory bowel disease, with an autoimmune etiology being more likely due to his history of autoimmune diseases. A stool sample for C. difficile will be ordered. If confirmed, treatment will involve Dificid . If not confirmed, steroids will be considered until a GI consultation. Zofran  is prescribed for nausea, and oxycodone  is advised for severe cramping pain, half a tablet as needed. He is encouraged to maintain hydration with oral intake of Gatorade or Pedialyte.

## 2023-11-08 NOTE — Telephone Encounter (Signed)
 FYI Only or Action Required?: FYI only for provider.  Patient was last seen in primary care on 10/19/2023 by Jerrell Cleatus Ned, MD.  Called Nurse Triage reporting Fatigue, Nausea, and Dehydration.  Symptoms began several days ago.  Interventions attempted: Rest, hydration, or home remedies.  Symptoms are: gradually worsening.  Triage Disposition: See HCP Within 4 Hours (Or PCP Triage)  Patient/caregiver understands and will follow disposition?: Yes     Copied from CRM [id number]. Topic: Clinical - Red Word Triage >> Nov 08, 2023 10:04 AM Suzen RAMAN wrote: Red Word that prompted transfer to Nurse Triage: low appetite,nausea,vomiting when consuming liquid, achy, possibly dehydrated. Previous dx with C Diff was advised by Dr. Landy to come into see him if symptoms return Reason for Disposition  [1] MODERATE weakness (e.g., interferes with work, school, normal activities) AND [2] cause unknown  (Exceptions: Weakness from acute minor illness or poor fluid intake; weakness is chronic and not worse.)  Answer Assessment - Initial Assessment Questions 1. DESCRIPTION: Describe how you are feeling.     Pt recently treated for Cdiff s/p abx therapy for bronchitis 2. SEVERITY: How bad is it?  Can you stand and walk?     Endorses syncopal episode last night - brief - resolved with resting head on head 3. ONSET: When did these symptoms begin? (e.g., hours, days, weeks, months)     A few days 4. CAUSE: What do you think is causing the weakness or fatigue? (e.g., not drinking enough fluids, medical problem, trouble sleeping)     Possible Cdiff returning 5. NEW MEDICINES:  Have you started on any new medicines recently? (e.g., opioid pain medicines, benzodiazepines, muscle relaxants, antidepressants, antihistamines, neuroleptics, beta blockers)     denies 6. OTHER SYMPTOMS: Do you have any other symptoms? (e.g., chest pain, fever, cough, SOB, vomiting, diarrhea, bleeding, other  areas of pain)     Achy in joints, unable to sleep, dehydration, N/V 7. PREGNANCY: Is there any chance you are pregnant? When was your last menstrual period?     N/a    Pt previously dx with C. Diff and sx are returning. Of note, pt is en route to [place] from [place]. [facility] strongly advised UC for any worsening sx. Pt states that he will likely go to UC close to residence. Patient verbalized understanding and to call back/go to UC with worsening symptoms.  Protocols used: Weakness (Generalized) and Fatigue-A-AH

## 2023-11-08 NOTE — Telephone Encounter (Signed)
 Ok. I can work him whenever he arrives.

## 2023-11-08 NOTE — Telephone Encounter (Signed)
 FYI  Pt previously dx with C. Diff and sx are returning. Of note, pt is en route to [place] from [place]. [facility] strongly advised UC for any worsening sx. Pt states that he will likely go to UC close to residence. Patient verbalized understanding and to call back/go to UC with worsening symptoms.

## 2023-11-09 ENCOUNTER — Telehealth: Payer: Self-pay | Admitting: Student in an Organized Health Care Education/Training Program

## 2023-11-09 NOTE — Telephone Encounter (Signed)
 noted     Copied from CRM #[id number]. Topic: General - Other >> Nov 09, 2023 11:05 AM Jayma L wrote: Reason for CRM: patient called in to let us  know he will be dropping a sample in 15-30 mins

## 2023-11-10 ENCOUNTER — Encounter: Payer: Self-pay | Admitting: Student in an Organized Health Care Education/Training Program

## 2023-11-11 ENCOUNTER — Ambulatory Visit: Payer: Self-pay | Admitting: Student in an Organized Health Care Education/Training Program

## 2023-11-11 DIAGNOSIS — A0472 Enterocolitis due to Clostridium difficile, not specified as recurrent: Secondary | ICD-10-CM

## 2023-11-11 LAB — C DIFFICILE TOXINS A+B W/RFLX: C difficile Toxins A+B, EIA: POSITIVE — AB

## 2023-11-11 MED ORDER — FIDAXOMICIN 200 MG PO TABS
200.0000 mg | ORAL_TABLET | Freq: Two times a day (BID) | ORAL | 0 refills | Status: DC
Start: 2023-11-11 — End: 2023-11-24

## 2023-11-11 NOTE — Telephone Encounter (Signed)
 Patients lab results are back but have not been resulted, patient states symptoms seem to be worsening.

## 2023-11-12 ENCOUNTER — Encounter: Payer: Self-pay | Admitting: Student in an Organized Health Care Education/Training Program

## 2023-11-12 NOTE — Telephone Encounter (Signed)
 Patient is questioning what you would recommend for him to help build healthy gut bacteria while taking antibiotics ?

## 2023-11-15 NOTE — Telephone Encounter (Signed)
 FYI patient is messaging in to update health

## 2023-11-24 ENCOUNTER — Encounter: Payer: Self-pay | Admitting: Gastroenterology

## 2023-11-24 ENCOUNTER — Ambulatory Visit (INDEPENDENT_AMBULATORY_CARE_PROVIDER_SITE_OTHER): Admitting: Gastroenterology

## 2023-11-24 ENCOUNTER — Other Ambulatory Visit

## 2023-11-24 VITALS — BP 102/76 | HR 60 | Ht 70.0 in | Wt 180.8 lb

## 2023-11-24 DIAGNOSIS — D696 Thrombocytopenia, unspecified: Secondary | ICD-10-CM | POA: Diagnosis not present

## 2023-11-24 DIAGNOSIS — R197 Diarrhea, unspecified: Secondary | ICD-10-CM

## 2023-11-24 DIAGNOSIS — Z8711 Personal history of peptic ulcer disease: Secondary | ICD-10-CM

## 2023-11-24 DIAGNOSIS — K219 Gastro-esophageal reflux disease without esophagitis: Secondary | ICD-10-CM | POA: Diagnosis not present

## 2023-11-24 DIAGNOSIS — R1084 Generalized abdominal pain: Secondary | ICD-10-CM | POA: Diagnosis not present

## 2023-11-24 DIAGNOSIS — Z8619 Personal history of other infectious and parasitic diseases: Secondary | ICD-10-CM

## 2023-11-24 DIAGNOSIS — K6289 Other specified diseases of anus and rectum: Secondary | ICD-10-CM

## 2023-11-24 DIAGNOSIS — R7989 Other specified abnormal findings of blood chemistry: Secondary | ICD-10-CM

## 2023-11-24 DIAGNOSIS — K580 Irritable bowel syndrome with diarrhea: Secondary | ICD-10-CM | POA: Diagnosis not present

## 2023-11-24 DIAGNOSIS — Z860101 Personal history of adenomatous and serrated colon polyps: Secondary | ICD-10-CM

## 2023-11-24 LAB — HEPATIC FUNCTION PANEL
ALT: 24 U/L (ref 0–53)
AST: 21 U/L (ref 0–37)
Albumin: 4.4 g/dL (ref 3.5–5.2)
Alkaline Phosphatase: 61 U/L (ref 39–117)
Bilirubin, Direct: 0.2 mg/dL (ref 0.0–0.3)
Total Bilirubin: 0.7 mg/dL (ref 0.2–1.2)
Total Protein: 6.7 g/dL (ref 6.0–8.3)

## 2023-11-24 LAB — CBC WITH DIFFERENTIAL/PLATELET
Basophils Absolute: 0 K/uL (ref 0.0–0.1)
Basophils Relative: 1 % (ref 0.0–3.0)
Eosinophils Absolute: 0.2 K/uL (ref 0.0–0.7)
Eosinophils Relative: 3.7 % (ref 0.0–5.0)
HCT: 44.8 % (ref 39.0–52.0)
Hemoglobin: 15.1 g/dL (ref 13.0–17.0)
Lymphocytes Relative: 30.7 % (ref 12.0–46.0)
Lymphs Abs: 1.5 K/uL (ref 0.7–4.0)
MCHC: 33.8 g/dL (ref 30.0–36.0)
MCV: 91.3 fl (ref 78.0–100.0)
Monocytes Absolute: 0.5 K/uL (ref 0.1–1.0)
Monocytes Relative: 10 % (ref 3.0–12.0)
Neutro Abs: 2.6 K/uL (ref 1.4–7.7)
Neutrophils Relative %: 54.6 % (ref 43.0–77.0)
Platelets: 163 K/uL (ref 150.0–400.0)
RBC: 4.91 Mil/uL (ref 4.22–5.81)
RDW: 12.5 % (ref 11.5–15.5)
WBC: 4.9 K/uL (ref 4.0–10.5)

## 2023-11-24 MED ORDER — DICYCLOMINE HCL 20 MG PO TABS
20.0000 mg | ORAL_TABLET | Freq: Four times a day (QID) | ORAL | 0 refills | Status: AC | PRN
Start: 2023-11-24 — End: ?

## 2023-11-24 MED ORDER — SACCHAROMYCES BOULARDII 250 MG PO CAPS
250.0000 mg | ORAL_CAPSULE | Freq: Two times a day (BID) | ORAL | 2 refills | Status: AC
Start: 2023-11-24 — End: ?

## 2023-11-24 MED ORDER — OMEPRAZOLE 40 MG PO CPDR
40.0000 mg | DELAYED_RELEASE_CAPSULE | Freq: Every day | ORAL | 2 refills | Status: AC
Start: 2023-11-24 — End: ?

## 2023-11-24 NOTE — Progress Notes (Signed)
 Chief Complaint: recent Cdiff infection Primary GI Doctor: (previously Dr. Aneita) Dr. San  HPI:  Patient is a  58  year old male patient with past medical history of IBS, MS, GERD, adenomatous colon polyps, internal hemorrhoids who presents for evaluation of diarrhea and recent Cdiff infection  Patient last seen in GI office on 05/05/2022 by Dr. Aneita for frequent hemorrhoid symptoms and intermittent severe rectal pain.  11/08/23 patient empirically treated for C. Difficile with vancomycin .  Patient was tested for C. difficile infection about 1 week after completing vancomycin  which was positive.  Patient was treated with fidaxomicin  200 mg twice daily.   Interval History    Patient presents for follow-up after recent C. difficile infection.  Patient reports he was recently treated for bronchitis with antibiotics.  Shortly after that patient started to experience abdominal cramping with severe diarrhea.  Patient states over the course of 2 to 3 weeks he lost about 15 pounds.  Patient initially was started on vancomycin  which he states helped after he took a few doses, but then shortly after completing the medication his symptoms returned.  Patient recently finished Dificid  2 days ago.  Patient states he was averaging 2 bowel movements per day however is back down to his norm which is 1.  Patient reports he has been taking dicyclomine  as needed for abdominal cramping with eating which helps.  Patient has slowly started to introduce regular foods into his diet and overall has been doing okay.    Patient wanted me to note he has history of vasovagal syncope episodes during bowel movements or with abrupt onset of abdominal and/or rectal pain.      Patient also has history of gastric ulcer and GERD and has remained asymptomatic on omeprazole  40 mg p.o. daily.  No dysphagia.  Patient states he is slowly starting to get his appetite back and gained some of the weight he has lost.  Wt Readings from  Last 3 Encounters:  11/24/23 180 lb 12.8 oz (82 kg)  11/08/23 179 lb 6.4 oz (81.4 kg)  10/19/23 183 lb (83 kg)    Past Medical History:  Diagnosis Date   Anxiety    Benign fundic gland polyps of stomach    Headache(784.0)    Hyperlipidemia    Hypertension    MS (multiple sclerosis) (HCC)    Neuromuscular disorder (HCC)    Ulcer    hx of duodenal ulcer at age 23    Past Surgical History:  Procedure Laterality Date   COLONOSCOPY     HEMORRHOID SURGERY  04/09/2021   RHINOPLASTY     X2    Current Outpatient Medications  Medication Sig Dispense Refill   atorvastatin  (LIPITOR) 20 MG tablet TAKE 1 TABLET(20 MG) BY MOUTH DAILY 90 tablet 1   clonazePAM  (KLONOPIN ) 0.5 MG tablet Take 1-2 tablets (0.5-1 mg total) by mouth 2 (two) times daily as needed (spasm, back). 20 tablet 1   diazepam  (VALIUM ) 10 MG tablet Take 1 tablet (10 mg total) by mouth every 8 (eight) hours as needed for anxiety. 15 tablet 0   rizatriptan  (MAXALT ) 5 MG tablet TAKE 1 TABLET BY MOUTH AS NEEDED FOR HEADACHE. Thaddaeus Granja REPEAT ONCE IN 2 HOURS IF NEEDED 10 tablet 2   saccharomyces boulardii (FLORASTOR) 250 MG capsule Take 1 capsule (250 mg total) by mouth 2 (two) times daily. 60 capsule 2   tacrolimus (PROTOPIC) 0.1 % ointment APPLY TOPICALLY TO THE AFFECTED AREA EVERY DAY External; Duration: 15  traMADol  (ULTRAM ) 50 MG tablet Take one tablet by mouth three times a day as needed for pain 30 tablet 0   triamcinolone  cream (KENALOG ) 0.1 % Apply 1 Application topically 2 (two) times daily. As needed for rash. 30 g 2   dicyclomine  (BENTYL ) 20 MG tablet Take 1 tablet (20 mg total) by mouth every 6 (six) hours as needed for spasms. 90 tablet 0   omeprazole  (PRILOSEC) 40 MG capsule Take 1 capsule (40 mg total) by mouth daily. TAKE 1 CAPSULE BY MOUTH EVERY DAY.NEED APPOINTMENT FOR MORE REFILLS! 30 capsule 2   No current facility-administered medications for this visit.   Facility-Administered Medications Ordered in Other  Visits  Medication Dose Route Frequency Provider Last Rate Last Admin   gadopentetate dimeglumine  (MAGNEVIST ) injection 19 mL  19 mL Intravenous Once PRN Sater, Charlie LABOR, MD        Allergies as of 11/24/2023   (No Known Allergies)    Family History  Problem Relation Age of Onset   Arthritis Mother    Suicidality Father    Brain cancer Brother        died   Diabetes Maternal Grandmother    Colon cancer Neg Hx    Stomach cancer Neg Hx    Esophageal cancer Neg Hx    Pancreatic cancer Neg Hx     Review of Systems:    Constitutional: No weight loss, fever, chills, weakness or fatigue HEENT: Eyes: No change in vision               Ears, Nose, Throat:  No change in hearing or congestion Skin: No rash or itching Cardiovascular: No chest pain, chest pressure or palpitations   Respiratory: No SOB or cough Gastrointestinal: See HPI and otherwise negative Genitourinary: No dysuria or change in urinary frequency Neurological: No headache, dizziness or syncope Musculoskeletal: No new muscle or joint pain Hematologic: No bleeding or bruising Psychiatric: No history of depression or anxiety    Physical Exam:  Vital signs: BP 102/76   Pulse 60   Ht 5' 10 (1.778 m)   Wt 180 lb 12.8 oz (82 kg)   BMI 25.94 kg/m   Constitutional:   Pleasant male appears to be in NAD, Well developed, Well nourished, alert and cooperative Throat: Oral cavity and pharynx without inflammation, swelling or lesion.  Respiratory: Respirations even and unlabored. Lungs clear to auscultation bilaterally.   No wheezes, crackles, or rhonchi.  Cardiovascular: Normal S1, S2. Regular rate and rhythm. No peripheral edema, cyanosis or pallor.  Gastrointestinal:  Soft, nondistended, lower abdominal tenderness. No rebound or guarding. Normal bowel sounds. No appreciable masses or hepatomegaly. Rectal:  Not performed.  Msk:  Symmetrical without gross deformities. Without edema, no deformity or joint abnormality.   Neurologic:  Alert and  oriented x4;  grossly normal neurologically.  Skin:   Dry and intact without significant lesions or rashes.  RELEVANT LABS AND IMAGING: CBC    Latest Ref Rng & Units 10/20/2023    8:24 AM 07/25/2023    5:04 PM 05/07/2022   10:19 AM  CBC  WBC 4.0 - 10.5 K/uL 11.8  8.6  5.6   Hemoglobin 13.0 - 17.0 g/dL 85.0  84.8  83.5   Hematocrit 39.0 - 52.0 % 44.2  43.0  47.9   Platelets 150.0 - 400.0 K/uL 111.0  141  146.0      CMP     Latest Ref Rng & Units 10/20/2023    8:24 AM 07/25/2023  5:04 PM 05/07/2022   10:19 AM  CMP  Glucose 70 - 99 mg/dL 898  89  87   BUN 6 - 23 mg/dL 14  17  11    Creatinine 0.40 - 1.50 mg/dL 8.96  8.91  8.99   Sodium 135 - 145 mEq/L 138  141  139   Potassium 3.5 - 5.1 mEq/L 3.9  3.6  4.7   Chloride 96 - 112 mEq/L 101  105  102   CO2 19 - 32 mEq/L 28  24  29    Calcium  8.4 - 10.5 mg/dL 8.7  9.0  9.4   Total Protein 6.0 - 8.3 g/dL 5.9   6.7   Total Bilirubin 0.2 - 1.2 mg/dL 1.0   0.7   Alkaline Phos 39 - 117 U/L 88   73   AST 0 - 37 U/L 98   22   ALT 0 - 53 U/L 84   22      Lab Results  Component Value Date   TSH 1.11 02/28/2013  10/20/2023 labs show: CRP 2.6, sed rate 6 11/09/2023 C difficile Toxins A+B, EIA  positive   GI procedures: 03/31/2021 colonoscopy, recall 7 years - One 6 mm polyp in the rectum, removed with a cold snare. Resected and retrieved. - One 4 mm polyp in the transverse colon, removed with a cold biopsy forceps. Resected and retrieved. - Normal appearing TI. - Internal hemorrhoids. - The examination was otherwise normal on direct and retroflexion views. Path: Surgical [P], colon, transverse, rectum, polyp (2) - TUBULAR ADENOMA WITHOUT HIGH-GRADE DYSPLASIA OR MALIGNANCY - HYPERPLASTIC POLYP(S) 01/27/2017 EGD - Normal esophagus. - Multiple gastric polyps. Biopsied. - Normal duodenal bulb and second portion of the duodenum. Path: Surgical [P], gastric body - FUNDIC GLAND POLYP(S). - NEGATIVE FOR HELICOBACTER  PYLORI. - NO INTESTINAL METAPLASIA, DYSPLASIA, OR MALIGNANCY. 01/27/2017 colonoscopy - Internal hemorrhoids. - Mild diverticulosis in the left colon. There was no evidence of diverticular bleeding. - The examination was otherwise normal on direct and retroflexion views. - No specimens collected.  2//2013 EGD 2-36mm polyps multiple Path: Surgical [P], gastric fundic, bx - FUNDIC GLAND POLYP. - WARTHIN-STARRY IS NEGATIVE FOR HELICOBACTER PYLORI. - NO DYSPLASIA OR MALIGNANCY IDENTIFIED  04/27/2011 colonoscopy 10 mm polyp sessile in the ascending colon 5 mm sessile polyp in the sigmoid colon Internal hemorrhoids  10/19/2023 CTAP IMPRESSION: 1. Pancolitis. No bowel obstruction. Normal appendix. 2.  Aortic Atherosclerosis (ICD10-I70.0).  Assessment: Encounter Diagnoses  Name Primary?   Proctitis Yes   History of Clostridium difficile colitis    Diarrhea, unspecified type    Generalized abdominal pain    Irritable bowel syndrome with diarrhea    Gastroesophageal reflux disease, unspecified whether esophagitis present    History of gastric ulcer    Thrombocytopenia (HCC)    Elevated LFTs    Hx of adenomatous colonic polyps      58 year old male patient who recently was diagnosed with Clostridium difficile and treated with Vancomycin , then with dificid  for recurrent infection. Suspected to be related to antibiotic use. CT scan did show proctitis. His symptoms have improved and currently using dicyclomine  prn.  We discussed low FODMAP diet and avoiding food triggers.  Patient can continue dicyclomine  as needed until symptoms resolve.  Patient does have history of IBS.  Will also start patient on Florastor 1 capsule twice daily for the next 1 to 2 months.  His most recent lab work did show elevated white blood count and LFTs.  Will go ahead and  recheck today.  History of chronic thrombocytopenia patient tells me he has never seen hematologist for this.  CT scan did not show any evidence of liver  or gallbladder issues.  Most likely due to recent infection and/or antibiotic therapy.  Will go ahead and schedule follow-up colonoscopy in 4 to 6 weeks to reevaluate area of inflammation to rule out IBD.      Patient also has history of gastric ulcer and GERD that is well-managed on omeprazole  40 mg p.o. daily.  Did mention to patient that these medications do carry some risk with cdiff. He would like to continue     Proctalgia fugax, possibly exacerbated by hemorrhoid symptoms     Grade II internal hemorrhoids. RP banded in January 2023 with severe pain following banding, currently not an issue     Personal history of adenomatous colon polyps, recall 03/2028  Plan: - Continue omeprazole  40 mg po daily , refilled -Recheck hepatic function panel, CBC today - Start florastor 1 capsule po daily , 1 refill  - Schedule for a colonoscopy in [facility] with Dr. San . The risks and benefits of colonoscopy with possible polypectomy / biopsies were discussed and the patient agrees to proceed.   Thank you for the courtesy of this consult. Please call me with any questions or concerns.   Mychaela Lennartz, FNP-C  Gastroenterology 11/24/2023, 4:49 PM  Cc: Jerrell Cleatus Debby DEWAINE

## 2023-11-24 NOTE — Patient Instructions (Addendum)
 GERD Continue omeprazole  GERD diet  Hx of Cdiff Continue dicyclomine  prn as needed Florastor 1 tablet twice daily for 2 mths Recommend low fodmap diet Contact office if symptoms return  Please call and schedule colonoscopy at your earliest convenience.  Your provider has requested that you go to the basement level for lab work before leaving today. Press B on the elevator. The lab is located at the first door on the left as you exit the elevator.  _______________________________________________________  If your blood pressure at your visit was 140/90 or greater, please contact your primary care physician to follow up on this.  _______________________________________________________  If you are age 73 or older, your body mass index should be between 23-30. Your Body mass index is 25.94 kg/m. If this is out of the aforementioned range listed, please consider follow up with your Primary Care Provider.  If you are age 30 or younger, your body mass index should be between 19-25. Your Body mass index is 25.94 kg/m. If this is out of the aformentioned range listed, please consider follow up with your Primary Care Provider.   ________________________________________________________  The [facility] providers would like to encourage you to use MYCHART to communicate with providers for non-urgent requests or questions.  Due to long hold times on the telephone, sending your provider a message by [facility] may be a faster and more efficient way to get a response.  Please allow 48 business hours for a response.  Please remember that this is for non-urgent requests.  _______________________________________________________  [facility] is using a team-based approach to care.  Your team is made up of your doctor and two to three APPS. Our APPS (Nurse Practitioners and Physician Assistants) work with your physician to ensure care continuity for you. They are fully qualified to address your  health concerns and develop a treatment plan. They communicate directly with your gastroenterologist to care for you. Seeing the Advanced Practice Practitioners on your physician's team can help you by facilitating care more promptly, often allowing for earlier appointments, access to diagnostic testing, procedures, and other specialty referrals.   Thank you for trusting me with your gastrointestinal care. Deanna May, FNP-C

## 2023-11-25 ENCOUNTER — Ambulatory Visit: Payer: Self-pay | Admitting: Gastroenterology

## 2023-11-25 ENCOUNTER — Encounter: Payer: Self-pay | Admitting: Gastroenterology

## 2023-11-25 NOTE — Progress Notes (Signed)
 Agree with the assessment and plan as outlined by [facility], FNP-C.  Willie Bottino, DO, [facility]

## 2023-12-16 ENCOUNTER — Other Ambulatory Visit: Payer: Self-pay

## 2023-12-16 ENCOUNTER — Ambulatory Visit: Payer: Self-pay | Admitting: Gastroenterology

## 2023-12-16 ENCOUNTER — Other Ambulatory Visit (INDEPENDENT_AMBULATORY_CARE_PROVIDER_SITE_OTHER)

## 2023-12-16 DIAGNOSIS — R197 Diarrhea, unspecified: Secondary | ICD-10-CM

## 2023-12-16 DIAGNOSIS — Z8619 Personal history of other infectious and parasitic diseases: Secondary | ICD-10-CM

## 2023-12-16 LAB — CBC
HCT: 45.4 % (ref 39.0–52.0)
Hemoglobin: 15.4 g/dL (ref 13.0–17.0)
MCHC: 33.9 g/dL (ref 30.0–36.0)
MCV: 91.5 fl (ref 78.0–100.0)
Platelets: 133 K/uL — ABNORMAL LOW (ref 150.0–400.0)
RBC: 4.96 Mil/uL (ref 4.22–5.81)
RDW: 12.9 % (ref 11.5–15.5)
WBC: 5 K/uL (ref 4.0–10.5)

## 2024-01-03 ENCOUNTER — Ambulatory Visit (AMBULATORY_SURGERY_CENTER)

## 2024-01-03 VITALS — Ht 70.0 in | Wt 181.0 lb

## 2024-01-03 DIAGNOSIS — K6289 Other specified diseases of anus and rectum: Secondary | ICD-10-CM

## 2024-01-03 DIAGNOSIS — R197 Diarrhea, unspecified: Secondary | ICD-10-CM

## 2024-01-03 DIAGNOSIS — Z860101 Personal history of adenomatous and serrated colon polyps: Secondary | ICD-10-CM

## 2024-01-03 DIAGNOSIS — Z8619 Personal history of other infectious and parasitic diseases: Secondary | ICD-10-CM

## 2024-01-03 DIAGNOSIS — K580 Irritable bowel syndrome with diarrhea: Secondary | ICD-10-CM

## 2024-01-03 MED ORDER — NA SULFATE-K SULFATE-MG SULF 17.5-3.13-1.6 GM/177ML PO SOLN
1.0000 | Freq: Once | ORAL | 0 refills | Status: AC
Start: 2024-01-03 — End: 2024-01-03

## 2024-01-03 NOTE — Progress Notes (Signed)

## 2024-01-07 ENCOUNTER — Encounter: Payer: Self-pay | Admitting: Gastroenterology

## 2024-01-17 ENCOUNTER — Encounter: Payer: Self-pay | Admitting: Gastroenterology

## 2024-01-17 ENCOUNTER — Ambulatory Visit: Admitting: Gastroenterology

## 2024-01-17 VITALS — BP 111/72 | HR 61 | Temp 97.5°F | Resp 14 | Ht 70.0 in | Wt 181.0 lb

## 2024-01-17 DIAGNOSIS — Z860101 Personal history of adenomatous and serrated colon polyps: Secondary | ICD-10-CM | POA: Diagnosis not present

## 2024-01-17 DIAGNOSIS — K5730 Diverticulosis of large intestine without perforation or abscess without bleeding: Secondary | ICD-10-CM | POA: Diagnosis not present

## 2024-01-17 DIAGNOSIS — Z1211 Encounter for screening for malignant neoplasm of colon: Secondary | ICD-10-CM

## 2024-01-17 DIAGNOSIS — K635 Polyp of colon: Secondary | ICD-10-CM

## 2024-01-17 DIAGNOSIS — D125 Benign neoplasm of sigmoid colon: Secondary | ICD-10-CM

## 2024-01-17 DIAGNOSIS — K648 Other hemorrhoids: Secondary | ICD-10-CM

## 2024-01-17 DIAGNOSIS — R933 Abnormal findings on diagnostic imaging of other parts of digestive tract: Secondary | ICD-10-CM

## 2024-01-17 DIAGNOSIS — Z8619 Personal history of other infectious and parasitic diseases: Secondary | ICD-10-CM | POA: Diagnosis not present

## 2024-01-17 DIAGNOSIS — K640 First degree hemorrhoids: Secondary | ICD-10-CM

## 2024-01-17 MED ORDER — SODIUM CHLORIDE 0.9 % IV SOLN: 500.0000 mL | Freq: Once | INTRAVENOUS | Status: DC

## 2024-01-17 NOTE — Patient Instructions (Addendum)
-  Handout on polyps, diverticulosis and hemorrhoids provided -Await pathology results  YOU HAD AN ENDOSCOPIC PROCEDURE TODAY AT THE [facility]:   Refer to the procedure report that was given to you for any specific questions about what was found during the examination.  If the procedure report does not answer your questions, please call your gastroenterologist to clarify.  If you requested that your care partner not be given the details of your procedure findings, then the procedure report has been included in a sealed envelope for you to review at your convenience later.  YOU SHOULD EXPECT: Some feelings of bloating in the abdomen. Passage of more gas than usual.  Walking can help get rid of the air that was put into your GI tract during the procedure and reduce the bloating. If you had a lower endoscopy (such as a colonoscopy or flexible sigmoidoscopy) you may notice spotting of blood in your stool or on the toilet paper. If you underwent a bowel prep for your procedure, you may not have a normal bowel movement for a few days.  Please Note:  You might notice some irritation and congestion in your nose or some drainage.  This is from the oxygen used during your procedure.  There is no need for concern and it should clear up in a day or so.  SYMPTOMS TO REPORT IMMEDIATELY:  Following lower endoscopy (colonoscopy or flexible sigmoidoscopy):  Excessive amounts of blood in the stool  Significant tenderness or worsening of abdominal pains  Swelling of the abdomen that is new, acute  Fever of 100F or higher  For urgent or emergent issues, a gastroenterologist can be reached at any hour by calling [phone]. Do not use MyChart messaging for urgent concerns.    DIET:  We do recommend a small meal at first, but then you may proceed to your regular diet.  Drink plenty of fluids but you should avoid alcoholic beverages for 24 hours.  ACTIVITY:  You should plan to take it easy for the  rest of today and you should NOT DRIVE or use heavy machinery until tomorrow (because of the sedation medicines used during the test).    FOLLOW UP: Our staff will call the number listed on your records the next business day following your procedure.  We will call around 7:15- 8:00 am to check on you and address any questions or concerns that you may have regarding the information given to you following your procedure. If we do not reach you, we will leave a message.     If any biopsies were taken you will be contacted by phone or by letter within the next 1-3 weeks.  Please call us  at [phone] if you have not heard about the biopsies in 3 weeks.    SIGNATURES/CONFIDENTIALITY: You and/or your care partner have signed paperwork which will be entered into your electronic medical record.  These signatures attest to the fact that that the information above on your After Visit Summary has been reviewed and is understood.  Full responsibility of the confidentiality of this discharge information lies with you and/or your care-partner.

## 2024-01-17 NOTE — Progress Notes (Signed)
To Pacu, VSS. Report to [email or web address] 

## 2024-01-17 NOTE — Progress Notes (Signed)
 Called to room to assist during endoscopic procedure.  Patient ID and intended procedure confirmed with present staff. Received instructions for my participation in the procedure from the performing physician.

## 2024-01-17 NOTE — Progress Notes (Signed)
 VS by RP   Pt's states no medical or surgical changes since previsit or office visit.

## 2024-01-17 NOTE — Op Note (Signed)
 [facility] Patient Name: Willie Lucas Procedure Date: 01/17/2024 8:45 AM MRN: [id number] Endoscopist: Sandor Flatter , MD, [id number] Age: 58 Referring MD:  Date of Birth: 1965/08/17 Gender: Male Account #: [id number] Procedure:                Colonoscopy Indications:              Abnormal CT of the GI tract, Change in bowel habits                           Recent recurrent C difficile infection                           History of colon poylps Medicines:                Monitored Anesthesia Care Procedure:                Pre-Anesthesia Assessment:                           - Prior to the procedure, a History and Physical                            was performed, and patient medications and                            allergies were reviewed. The patient's tolerance of                            previous anesthesia was also reviewed. The risks                            and benefits of the procedure and the sedation                            options and risks were discussed with the patient.                            All questions were answered, and informed consent                            was obtained. Prior Anticoagulants: The patient has                            taken no anticoagulant or antiplatelet agents. ASA                            Grade Assessment: II - A patient with mild systemic                            disease. After reviewing the risks and benefits,                            the patient was deemed in satisfactory condition to  undergo the procedure.                           After obtaining informed consent, the colonoscope                            was passed under direct vision. Throughout the                            procedure, the patient's blood pressure, pulse, and                            oxygen saturations were monitored continuously. The                            CF [id number] #[id number] was introduced through  the anus                            and advanced to the the cecum, identified by                            appendiceal orifice and ileocecal valve. The                            colonoscopy was performed without difficulty. The                            patient tolerated the procedure well. The quality                            of the bowel preparation was good. The ileocecal                            valve, appendiceal orifice, and rectum were                            photographed. Scope In: 9:00:50 AM Scope Out: 9:14:42 AM Scope Withdrawal Time: 0 hours 10 minutes 33 seconds  Total Procedure Duration: 0 hours 13 minutes 52 seconds  Findings:                 The perianal and digital rectal examinations were                            normal.                           A 3 mm polyp was found in the sigmoid colon. The                            polyp was sessile. The polyp was removed with a                            cold snare. Resection and retrieval were complete.  Estimated blood loss was minimal.                           A few small-mouthed diverticula were found in the                            sigmoid colon.                           The mucosa was otherwise normal appearing                            throughout the colon. No areas of mucosal erythema,                            edema, erosions, or ulceration noted on this study.                           Non-bleeding internal hemorrhoids were found during                            retroflexion. The hemorrhoids were small. Complications:            No immediate complications. Estimated Blood Loss:     Estimated blood loss was minimal. Impression:               - One 3 mm polyp in the sigmoid colon, removed with                            a cold snare. Resected and retrieved.                           - Diverticulosis in the sigmoid colon.                           - Normal mucosa in the entire  examined colon.                           - Non-bleeding internal hemorrhoids. Recommendation:           - Patient has a contact number available for                            emergencies. The signs and symptoms of potential                            delayed complications were discussed with the                            patient. Return to normal activities tomorrow.                            Written discharge instructions were provided to the                            patient.                           -  Resume previous diet.                           - Continue present medications.                           - Await pathology results.                           - Repeat colonoscopy for surveillance of polyps                            depending on pathology results.                           - Return to GI clinic PRN. Sandor Flatter, MD 01/17/2024 9:20:41 AM

## 2024-01-17 NOTE — Progress Notes (Signed)
 GASTROENTEROLOGY PROCEDURE H&P NOTE   Primary Care Physician: Levora Reyes DEL, MD    Reason for Procedure:   Recent recurrent C difficile infection, Pancolitis on CT, history of colon polyps  Plan:    Colonoscopy  Patient is appropriate for endoscopic procedure(s) in the ambulatory ([facility]) setting.  The nature of the procedure, as well as the risks, benefits, and alternatives were carefully and thoroughly reviewed with the patient. Ample time for discussion and questions allowed. The patient understood, was satisfied, and agreed to proceed.     HPI: Willie Lucas is a 58 y.o. male who presents for colonoscopy for evaluation of pancolitis on CT and recent recurrent C diff along with polyp surveillance. Otherwise, no significant changes in clinical hx since last OV on 11/24/2023.   Past Medical History:  Diagnosis Date   Anxiety    Benign fundic gland polyps of stomach    Headache(784.0)    Hyperlipidemia    Hypertension    MS (multiple sclerosis)    Neuromuscular disorder (HCC)    Ulcer    hx of duodenal ulcer at age 40    Past Surgical History:  Procedure Laterality Date   COLONOSCOPY     HEMORRHOID SURGERY  04/09/2021   RHINOPLASTY     X2    Prior to Admission medications   Medication Sig Start Date End Date Taking? Authorizing Provider  atorvastatin  (LIPITOR) 20 MG tablet TAKE 1 TABLET(20 MG) BY MOUTH DAILY 07/07/23  Yes Levora Reyes SAUNDERS, MD  omeprazole  (PRILOSEC) 40 MG capsule Take 1 capsule (40 mg total) by mouth daily. TAKE 1 CAPSULE BY MOUTH EVERY DAY.NEED APPOINTMENT FOR MORE REFILLS! 11/24/23  Yes May, Deanna J, NP  saccharomyces boulardii (FLORASTOR) 250 MG capsule Take 1 capsule (250 mg total) by mouth 2 (two) times daily. 11/24/23  Yes May, Deanna J, NP  clonazePAM  (KLONOPIN ) 0.5 MG tablet Take 1-2 tablets (0.5-1 mg total) by mouth 2 (two) times daily as needed (spasm, back). 04/30/21   Levora Reyes SAUNDERS, MD  diazepam  (VALIUM ) 10 MG tablet Take 1 tablet (10 mg  total) by mouth every 8 (eight) hours as needed for anxiety. 07/25/23   Towana Ozell BROCKS, MD  dicyclomine  (BENTYL ) 20 MG tablet Take 1 tablet (20 mg total) by mouth every 6 (six) hours as needed for spasms. 11/24/23   May, Deanna J, NP  rizatriptan  (MAXALT ) 5 MG tablet TAKE 1 TABLET BY MOUTH AS NEEDED FOR HEADACHE. MAY REPEAT ONCE IN 2 HOURS IF NEEDED 10/19/23   Jerrell Cleatus Ned, MD  tacrolimus (PROTOPIC) 0.1 % ointment APPLY TOPICALLY TO THE AFFECTED AREA EVERY DAY External; Duration: 15 Patient taking differently: Apply 1 Application topically as needed.    [provider]  traMADol  (ULTRAM ) 50 MG tablet Take one tablet by mouth three times a day as needed for pain 04/23/21   Aneita Gwendlyn DASEN, MD  triamcinolone  cream (KENALOG ) 0.1 % Apply 1 Application topically 2 (two) times daily. As needed for rash. Patient taking differently: Apply 1 Application topically 2 (two) times daily as needed. As needed for rash. 10/29/21   Levora Reyes SAUNDERS, MD    Current Outpatient Medications  Medication Sig Dispense Refill   atorvastatin  (LIPITOR) 20 MG tablet TAKE 1 TABLET(20 MG) BY MOUTH DAILY 90 tablet 1   omeprazole  (PRILOSEC) 40 MG capsule Take 1 capsule (40 mg total) by mouth daily. TAKE 1 CAPSULE BY MOUTH EVERY DAY.NEED APPOINTMENT FOR MORE REFILLS! 30 capsule 2   saccharomyces boulardii (FLORASTOR) 250 MG  capsule Take 1 capsule (250 mg total) by mouth 2 (two) times daily. 60 capsule 2   clonazePAM  (KLONOPIN ) 0.5 MG tablet Take 1-2 tablets (0.5-1 mg total) by mouth 2 (two) times daily as needed (spasm, back). 20 tablet 1   diazepam  (VALIUM ) 10 MG tablet Take 1 tablet (10 mg total) by mouth every 8 (eight) hours as needed for anxiety. 15 tablet 0   dicyclomine  (BENTYL ) 20 MG tablet Take 1 tablet (20 mg total) by mouth every 6 (six) hours as needed for spasms. 90 tablet 0   rizatriptan  (MAXALT ) 5 MG tablet TAKE 1 TABLET BY MOUTH AS NEEDED FOR HEADACHE. MAY REPEAT ONCE IN 2 HOURS IF NEEDED 10 tablet 2    tacrolimus (PROTOPIC) 0.1 % ointment APPLY TOPICALLY TO THE AFFECTED AREA EVERY DAY External; Duration: 15 (Patient taking differently: Apply 1 Application topically as needed.)     traMADol  (ULTRAM ) 50 MG tablet Take one tablet by mouth three times a day as needed for pain 30 tablet 0   triamcinolone  cream (KENALOG ) 0.1 % Apply 1 Application topically 2 (two) times daily. As needed for rash. (Patient taking differently: Apply 1 Application topically 2 (two) times daily as needed. As needed for rash.) 30 g 2   Current Facility-Administered Medications  Medication Dose Route Frequency Provider Last Rate Last Admin   0.9 %  sodium chloride  infusion  500 mL Intravenous Once Adham Johnson V, DO       Facility-Administered Medications Ordered in Other Visits  Medication Dose Route Frequency Provider Last Rate Last Admin   gadopentetate dimeglumine  (MAGNEVIST ) injection 19 mL  19 mL Intravenous Once PRN Sater, Charlie LABOR, MD        Allergies as of 01/17/2024   (No Known Allergies)    Family History  Problem Relation Age of Onset   Arthritis Mother    Suicidality Father    Brain cancer Brother        died   Diabetes Maternal Grandmother    Colon cancer Neg Hx    Stomach cancer Neg Hx    Esophageal cancer Neg Hx    Pancreatic cancer Neg Hx    Rectal cancer Neg Hx     Social History   Socioeconomic History   Marital status: Married    Spouse name: Not on file   Number of children: 2   Years of education: Not on file   Highest education level: Bachelor's degree (e.g., BA, AB, BS)  Occupational History   Occupation: Electrical engineer  Tobacco Use   Smoking status: Former    Current packs/day: 0.00    Average packs/day: 0.3 packs/day for 10.0 years (2.5 ttl pk-yrs)    Types: Cigarettes    Start date: 99    Quit date: 1998    Years since quitting: 27.8   Smokeless tobacco: Never  Vaping Use   Vaping status: Never Used  Substance and Sexual Activity   Alcohol use: Yes     Comment: occ   Drug use: No   Sexual activity: Yes    Partners: Female  Other Topics Concern   Not on file  Social History Narrative   Right Handed   2 Cups of Coffee per Day   Social Drivers of Health   Financial Resource Strain: Low Risk  (10/19/2023)   Overall Financial Resource Strain (CARDIA)    Difficulty of Paying Living Expenses: Not hard at all  Food Insecurity: No Food Insecurity (10/19/2023)   Hunger Vital Sign  Worried About Programme Researcher, Broadcasting/film/video in the Last Year: Never true    Ran Out of Food in the Last Year: Never true  Transportation Needs: No Transportation Needs (10/19/2023)   PRAPARE - Administrator, Civil Service (Medical): No    Lack of Transportation (Non-Medical): No  Physical Activity: Insufficiently Active (10/19/2023)   Exercise Vital Sign    Days of Exercise per Week: 2 days    Minutes of Exercise per Session: 20 min  Stress: Stress Concern Present (10/19/2023)   [employer] of Occupational Health - Occupational Stress Questionnaire    Feeling of Stress: To some extent  Social Connections: Moderately Isolated (10/19/2023)   Social Connection and Isolation Panel    Frequency of Communication with Friends and Family: More than three times a week    Frequency of Social Gatherings with Friends and Family: Twice a week    Attends Religious Services: Never    Database Administrator or Organizations: No    Attends Engineer, Structural: Not on file    Marital Status: Married  Catering Manager Violence: Not on file    Physical Exam: Vital signs in last 24 hours: @BP  106/60   Pulse 72   Temp (!) 97.5 F (36.4 C)   Ht 5' 10 (1.778 m)   Wt 181 lb (82.1 kg)   SpO2 98%   BMI 25.97 kg/m  GEN: NAD EYE: Sclerae anicteric ENT: MMM CV: Non-tachycardic Pulm: CTA b/l GI: Soft, NT/ND NEURO:  Alert & Oriented x 3   Sandor Flatter, DO [facility]   01/17/2024 8:49 AM

## 2024-01-18 ENCOUNTER — Telehealth: Payer: Self-pay | Admitting: *Deleted

## 2024-01-18 NOTE — Telephone Encounter (Signed)
 Left message on f/u call

## 2024-01-19 LAB — SURGICAL PATHOLOGY

## 2024-01-26 ENCOUNTER — Ambulatory Visit: Payer: Self-pay | Admitting: Gastroenterology

## 2024-02-01 ENCOUNTER — Other Ambulatory Visit: Payer: Self-pay | Admitting: Family Medicine

## 2024-02-01 DIAGNOSIS — Z0000 Encounter for general adult medical examination without abnormal findings: Secondary | ICD-10-CM

## 2024-02-01 DIAGNOSIS — E785 Hyperlipidemia, unspecified: Secondary | ICD-10-CM

## 2024-03-03 ENCOUNTER — Other Ambulatory Visit: Payer: Self-pay | Admitting: Family Medicine

## 2024-03-03 DIAGNOSIS — Z0000 Encounter for general adult medical examination without abnormal findings: Secondary | ICD-10-CM

## 2024-03-03 DIAGNOSIS — E785 Hyperlipidemia, unspecified: Secondary | ICD-10-CM

## 2024-03-05 ENCOUNTER — Other Ambulatory Visit: Payer: Self-pay | Admitting: Gastroenterology

## 2024-03-05 DIAGNOSIS — R1084 Generalized abdominal pain: Secondary | ICD-10-CM

## 2024-03-23 ENCOUNTER — Other Ambulatory Visit: Payer: Self-pay | Admitting: Neurology

## 2024-03-23 DIAGNOSIS — R519 Headache, unspecified: Secondary | ICD-10-CM

## 2024-03-28 NOTE — Telephone Encounter (Signed)
 He has been followed by neurology previously, Maxalt  as needed for migraines.  Will refill.

## 2024-04-05 ENCOUNTER — Other Ambulatory Visit: Payer: Self-pay | Admitting: Family Medicine

## 2024-04-05 DIAGNOSIS — Z0000 Encounter for general adult medical examination without abnormal findings: Secondary | ICD-10-CM

## 2024-04-05 DIAGNOSIS — E785 Hyperlipidemia, unspecified: Secondary | ICD-10-CM

## 2024-06-01 ENCOUNTER — Encounter: Admitting: Family Medicine
